# Patient Record
Sex: Female | Born: 1975
Health system: Southern US, Community
[De-identification: ages and names within clinical notes are randomized; demographics above are authoritative.]

## PROBLEM LIST (undated history)

## (undated) DIAGNOSIS — I10 Essential (primary) hypertension: Secondary | ICD-10-CM

## (undated) DIAGNOSIS — E119 Type 2 diabetes mellitus without complications: Secondary | ICD-10-CM

## (undated) DIAGNOSIS — F32A Depression, unspecified: Secondary | ICD-10-CM

## (undated) HISTORY — DX: Type 2 diabetes mellitus without complications: E11.9

## (undated) HISTORY — DX: Essential (primary) hypertension: I10

## (undated) HISTORY — DX: Depression, unspecified: F32.A

---
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
## 2001-07-02 ENCOUNTER — Other Ambulatory Visit: Admission: RE | Admit: 2001-07-02 | Discharge: 2001-07-02 | Payer: Self-pay | Admitting: Family Medicine

## 2003-05-13 ENCOUNTER — Other Ambulatory Visit: Admission: RE | Admit: 2003-05-13 | Discharge: 2003-05-13 | Payer: Self-pay | Admitting: Family Medicine

## 2003-06-05 ENCOUNTER — Other Ambulatory Visit: Admission: RE | Admit: 2003-06-05 | Discharge: 2003-06-05 | Payer: Self-pay | Admitting: Obstetrics and Gynecology

## 2003-12-01 ENCOUNTER — Other Ambulatory Visit: Admission: RE | Admit: 2003-12-01 | Discharge: 2003-12-01 | Payer: Self-pay | Admitting: Obstetrics and Gynecology

## 2004-08-09 ENCOUNTER — Other Ambulatory Visit: Admission: RE | Admit: 2004-08-09 | Discharge: 2004-08-09 | Payer: Self-pay | Admitting: Obstetrics and Gynecology

## 2005-08-22 ENCOUNTER — Other Ambulatory Visit: Admission: RE | Admit: 2005-08-22 | Discharge: 2005-08-22 | Payer: Self-pay | Admitting: Obstetrics and Gynecology

## 2006-10-02 ENCOUNTER — Other Ambulatory Visit: Admission: RE | Admit: 2006-10-02 | Discharge: 2006-10-02 | Payer: Self-pay | Admitting: Obstetrics and Gynecology

## 2007-06-19 ENCOUNTER — Ambulatory Visit: Payer: Self-pay | Admitting: Oncology

## 2007-09-09 ENCOUNTER — Other Ambulatory Visit: Admission: RE | Admit: 2007-09-09 | Discharge: 2007-09-09 | Payer: Self-pay | Admitting: Family Medicine

## 2009-01-28 ENCOUNTER — Other Ambulatory Visit: Admission: RE | Admit: 2009-01-28 | Discharge: 2009-01-28 | Payer: Self-pay | Admitting: Family Medicine

## 2010-05-17 ENCOUNTER — Ambulatory Visit: Payer: Self-pay | Admitting: Hematology & Oncology

## 2010-12-08 ENCOUNTER — Ambulatory Visit: Payer: Self-pay | Admitting: Internal Medicine

## 2010-12-09 ENCOUNTER — Ambulatory Visit: Payer: Managed Care, Other (non HMO) | Admitting: Internal Medicine

## 2010-12-26 ENCOUNTER — Other Ambulatory Visit (INDEPENDENT_AMBULATORY_CARE_PROVIDER_SITE_OTHER): Payer: Managed Care, Other (non HMO)

## 2010-12-26 ENCOUNTER — Encounter: Payer: Self-pay | Admitting: Internal Medicine

## 2010-12-26 ENCOUNTER — Ambulatory Visit (INDEPENDENT_AMBULATORY_CARE_PROVIDER_SITE_OTHER): Payer: Managed Care, Other (non HMO) | Admitting: Internal Medicine

## 2010-12-26 VITALS — BP 130/82 | HR 80 | Temp 98.1°F | Resp 16 | Ht 63.5 in | Wt 259.2 lb

## 2010-12-26 DIAGNOSIS — I10 Essential (primary) hypertension: Secondary | ICD-10-CM

## 2010-12-26 LAB — BASIC METABOLIC PANEL
BUN: 15 mg/dL (ref 6–23)
Calcium: 9.2 mg/dL (ref 8.4–10.5)
GFR: 90.28 mL/min (ref >60.00)
Glucose, Bld: 97 mg/dL (ref 70–99)
Potassium: 3.8 mEq/L (ref 3.5–5.1)

## 2010-12-26 MED ORDER — HYDROCHLOROTHIAZIDE 25 MG PO TABS: 25.0000 mg | ORAL_TABLET | Freq: Every day | ORAL | Status: DC

## 2010-12-26 MED ORDER — AMLODIPINE BESYLATE 10 MG PO TABS: 10.0000 mg | ORAL_TABLET | Freq: Every day | ORAL | Status: DC

## 2010-12-26 NOTE — Patient Instructions (Signed)

## 2010-12-26 NOTE — Progress Notes (Signed)
  Subjective:    Patient ID: Hannah Orr, female    DOB: Jul 28, 1975, 35 y.o.   MRN: [id number]  Hypertension This is a chronic problem. The current episode started more than 1 year ago. The problem has been gradually improving since onset. The problem is controlled. Pertinent negatives include no anxiety, blurred vision, chest pain, headaches, malaise/fatigue, neck pain, orthopnea, palpitations, peripheral edema, PND, shortness of breath or sweats. There are no associated agents to hypertension. Past treatments include calcium channel blockers and diuretics. The current treatment provides significant improvement. Compliance problems include exercise and diet.       Review of Systems  Constitutional: Negative.  Negative for malaise/fatigue.  HENT: Negative.  Negative for neck pain.   Eyes: Negative.  Negative for blurred vision.  Respiratory: Negative.  Negative for shortness of breath.   Cardiovascular: Negative.  Negative for chest pain, palpitations, orthopnea and PND.  Gastrointestinal: Negative.   Genitourinary: Negative.   Musculoskeletal: Negative.   Skin: Negative.   Neurological: Negative.  Negative for headaches.  Hematological: Negative.   Psychiatric/Behavioral: Negative.        Objective:   Physical Exam  Vitals reviewed. Constitutional: She is oriented to person, place, and time. She appears well-developed and well-nourished. No distress.  HENT:  Head: Normocephalic and atraumatic.  Mouth/Throat: Oropharynx is clear and moist. No oropharyngeal exudate.  Eyes: Conjunctivae are normal. Right eye exhibits no discharge. Left eye exhibits no discharge. No scleral icterus.  Neck: Normal range of motion. Neck supple. No JVD present. No tracheal deviation present. No thyromegaly present.  Cardiovascular: Normal rate, regular rhythm, normal heart sounds and intact distal pulses.  Exam reveals no gallop and no friction rub.   No murmur heard. Pulmonary/Chest: Effort normal  and breath sounds normal. No stridor. No respiratory distress. She has no wheezes. She has no rales. She exhibits no tenderness.  Abdominal: Soft. Bowel sounds are normal. She exhibits no distension and no mass. There is no tenderness. There is no rebound and no guarding.  Musculoskeletal: Normal range of motion. She exhibits no edema and no tenderness.  Lymphadenopathy:    She has no cervical adenopathy.  Neurological: She is oriented to person, place, and time. She displays normal reflexes. She exhibits normal muscle tone. Coordination normal.  Skin: Skin is warm and dry. No rash noted. She is not diaphoretic. No erythema. No pallor.  Psychiatric: She has a normal mood and affect. Her behavior is normal. Judgment and thought content normal.          Assessment & Plan:

## 2010-12-26 NOTE — Assessment & Plan Note (Signed)
Her BP is well controlled, I will check her lytes today, she is going to have her records sent to me from Dr. Yehuda Mao at [place]

## 2011-04-25 ENCOUNTER — Other Ambulatory Visit (INDEPENDENT_AMBULATORY_CARE_PROVIDER_SITE_OTHER): Payer: Managed Care, Other (non HMO)

## 2011-04-25 ENCOUNTER — Encounter: Payer: Self-pay | Admitting: Internal Medicine

## 2011-04-25 ENCOUNTER — Ambulatory Visit (INDEPENDENT_AMBULATORY_CARE_PROVIDER_SITE_OTHER): Payer: Managed Care, Other (non HMO) | Admitting: Internal Medicine

## 2011-04-25 DIAGNOSIS — I10 Essential (primary) hypertension: Secondary | ICD-10-CM

## 2011-04-25 DIAGNOSIS — F32A Depression, unspecified: Secondary | ICD-10-CM | POA: Insufficient documentation

## 2011-04-25 DIAGNOSIS — F329 Major depressive disorder, single episode, unspecified: Secondary | ICD-10-CM

## 2011-04-25 LAB — BASIC METABOLIC PANEL
CO2: 28 mEq/L (ref 19–32)
Calcium: 9.2 mg/dL (ref 8.4–10.5)
Creatinine, Ser: 0.7 mg/dL (ref 0.4–1.2)
GFR: 119.99 mL/min (ref >60.00)
Sodium: 138 mEq/L (ref 135–145)

## 2011-04-25 MED ORDER — DULOXETINE HCL 30 MG PO CPEP: 30.0000 mg | ORAL_CAPSULE | Freq: Every day | ORAL | Status: DC

## 2011-04-25 NOTE — Patient Instructions (Signed)
Hypertension As your heart beats, it forces blood through your arteries. This force is your blood pressure. If the pressure is too high, it is called hypertension (HTN) or high blood pressure. HTN is dangerous because you may have it and not know it. High blood pressure may mean that your heart has to work harder to pump blood. Your arteries may be narrow or stiff. The extra work puts you at risk for heart disease, stroke, and other problems.  Blood pressure consists of two numbers, a higher number over a lower, 110/72, for example. It is stated as "110 over 72." The ideal is below 120 for the top number (systolic) and under 80 for the bottom (diastolic). Write down your blood pressure today. You should pay close attention to your blood pressure if you have certain conditions such as:  Heart failure.   Prior heart attack.   Diabetes   Chronic kidney disease.   Prior stroke.   Multiple risk factors for heart disease.  To see if you have HTN, your blood pressure should be measured while you are seated with your arm held at the level of the heart. It should be measured at least twice. A one-time elevated blood pressure reading (especially in the Emergency Department) does not mean that you need treatment. There may be conditions in which the blood pressure is different between your right and left arms. It is important to see your caregiver soon for a recheck. Most people have essential hypertension which means that there is not a specific cause. This type of high blood pressure may be lowered by changing lifestyle factors such as:  Stress.   Smoking.   Lack of exercise.   Excessive weight.   Drug/tobacco/alcohol use.   Eating less salt.  Most people do not have symptoms from high blood pressure until it has caused damage to the body. Effective treatment can often prevent, delay or reduce that damage. TREATMENT  When a cause has been identified, treatment for high blood pressure is  directed at the cause. There are a large number of medications to treat HTN. These fall into several categories, and your caregiver will help you select the medicines that are best for you. Medications may have side effects. You should review side effects with your caregiver. If your blood pressure stays high after you have made lifestyle changes or started on medicines,   Your medication(s) may need to be changed.   Other problems may need to be addressed.   Be certain you understand your prescriptions, and know how and when to take your medicine.   Be sure to follow up with your caregiver within the time frame advised (usually within two weeks) to have your blood pressure rechecked and to review your medications.   If you are taking more than one medicine to lower your blood pressure, make sure you know how and at what times they should be taken. Taking two medicines at the same time can result in blood pressure that is too low.  SEEK IMMEDIATE MEDICAL CARE IF:  You develop a severe headache, blurred or changing vision, or confusion.   You have unusual weakness or numbness, or a faint feeling.   You have severe chest or abdominal pain, vomiting, or breathing problems.  MAKE SURE YOU:   Understand these instructions.   Will watch your condition.   Will get help right away if you are not doing well or get worse.  Document Released: 02/13/2005 Document Revised: 10/26/2010 Document Reviewed:   10/04/2007 ExitCare Patient Information 2012 [employer].Depression, Adolescent and Adult Depression is a true and treatable medical condition. In general there are two kinds of depression:  Depression we all experience in some form. For example depression from the death of a loved one, financial distress or natural disasters will trigger or increase depression.   Clinical depression, on the other hand, appears without an apparent cause or reason. This depression is a disease. Depression may be  caused by chemical imbalance in the body and brain or may come as a response to a physical illness. Alcohol and other drugs can cause depression.  DIAGNOSIS  The diagnosis of depression is usually based upon symptoms and medical history. TREATMENT  Treatments for depression fall into three categories. These are:  Drug therapy. There are many medicines that treat depression. Responses may vary and sometimes trial and error is necessary to determine the best medicines and dosage for a particular patient.   Psychotherapy, also called talking treatments, helps people resolve their problems by looking at them from a different point of view and by giving people insight into their own personal makeup. Traditional psychotherapy looks at a childhood source of a problem. Other psychotherapy will look at current conflicts and move toward solving those. If the cause of depression is drug use, counseling is available to help abstain. In time the depression will usually improve. If there were underlying causes for the chemical use, they can be addressed.   ECT (electroconvulsive therapy) or shock treatment is not as commonly used today. It is a very effective treatment for severe suicidal depression. During ECT electrical impulses are applied to the head. These impulses cause a generalized seizure. It can be effective but causes a loss of memory for recent events. Sometimes this loss of memory may include the last several months.  Treat all depression or suicide threats as serious. Obtain professional help. Do not wait to see if serious depression will get better over time without help. Seek help for yourself or those around you. In the [place] the number to the National Suicide Help Lines With 24 Hour Help Are: 1-800-SUICIDE 1-800-784-2433 Document Released: 02/11/2000 Document Revised: 10/26/2010 Document Reviewed: 10/02/2007 ExitCare Patient Information 2012 [employer]. 

## 2011-04-25 NOTE — Progress Notes (Signed)
  Subjective:    Patient ID: Hannah Orr, female    DOB: March 07, 1975, 36 y.o.   MRN: [id number]  Hypertension This is a chronic problem. The current episode started more than 1 year ago. The problem has been gradually improving since onset. The problem is controlled. Pertinent negatives include no anxiety, blurred vision, chest pain, headaches, malaise/fatigue, neck pain, orthopnea, palpitations, peripheral edema, PND, shortness of breath or sweats. Past treatments include diuretics and calcium channel blockers. The current treatment provides significant improvement. Compliance problems include exercise and diet.       Review of Systems  Constitutional: Negative for fever, chills, malaise/fatigue, activity change, appetite change, fatigue and unexpected weight change.  HENT: Negative.  Negative for neck pain.   Eyes: Negative.  Negative for blurred vision.  Respiratory: Negative for cough, chest tightness, shortness of breath and stridor.   Cardiovascular: Negative for chest pain, palpitations, orthopnea and PND.  Gastrointestinal: Negative for nausea, vomiting, abdominal pain, diarrhea, constipation, blood in stool, abdominal distention and anal bleeding.  Genitourinary: Negative.   Musculoskeletal: Negative for myalgias, back pain, joint swelling, arthralgias and gait problem.  Skin: Negative for color change, pallor, rash and wound.  Neurological: Negative for dizziness, tremors, seizures, syncope, facial asymmetry, speech difficulty, weakness, light-headedness, numbness and headaches.  Hematological: Negative for adenopathy. Does not bruise/bleed easily.  Psychiatric/Behavioral: Positive for sleep disturbance (dfa, fa, ema) and dysphoric mood (irritable, sad, angry, insomnia). Negative for suicidal ideas, hallucinations, behavioral problems, confusion, self-injury, decreased concentration and agitation. The patient is not nervous/anxious and is not hyperactive.        Objective:   Physical Exam  Vitals reviewed. Constitutional: She is oriented to person, place, and time. She appears well-developed and well-nourished. No distress.  HENT:  Head: Normocephalic and atraumatic.  Mouth/Throat: Oropharynx is clear and moist. No oropharyngeal exudate.  Eyes: Conjunctivae are normal. Right eye exhibits no discharge. Left eye exhibits no discharge. No scleral icterus.  Neck: Normal range of motion. Neck supple. No JVD present. No tracheal deviation present. No thyromegaly present.  Cardiovascular: Normal rate, regular rhythm, normal heart sounds and intact distal pulses.  Exam reveals no gallop and no friction rub.   No murmur heard. Pulmonary/Chest: Effort normal and breath sounds normal. No stridor. No respiratory distress. She has no wheezes. She has no rales. She exhibits no tenderness.  Abdominal: Soft. Bowel sounds are normal. She exhibits no distension and no mass. There is no tenderness. There is no rebound and no guarding.  Musculoskeletal: Normal range of motion. She exhibits no edema and no tenderness.  Lymphadenopathy:    She has no cervical adenopathy.  Neurological: She is oriented to person, place, and time.  Skin: Skin is warm and dry. No rash noted. She is not diaphoretic. No erythema. No pallor.  Psychiatric: She has a normal mood and affect. Her behavior is normal. Judgment and thought content normal.      Lab Results  Component Value Date   GLUCOSE 97 12/26/2010   NA 138 12/26/2010   K 3.8 12/26/2010   CL 102 12/26/2010   CREATININE 0.9 12/26/2010   BUN 15 12/26/2010   CO2 28 12/26/2010      Assessment & Plan:

## 2011-04-26 ENCOUNTER — Encounter: Payer: Self-pay | Admitting: Internal Medicine

## 2011-04-26 NOTE — Assessment & Plan Note (Signed)
Start cymbalta, start psychotherapy 

## 2011-04-26 NOTE — Assessment & Plan Note (Signed)
Her BP is well controlled, I will check her lytes and renal function 

## 2011-06-09 ENCOUNTER — Telehealth: Payer: Self-pay | Admitting: *Deleted

## 2011-06-09 DIAGNOSIS — F32A Depression, unspecified: Secondary | ICD-10-CM

## 2011-06-09 DIAGNOSIS — F329 Major depressive disorder, single episode, unspecified: Secondary | ICD-10-CM

## 2011-06-09 MED ORDER — FLUOXETINE HCL 20 MG PO CAPS: 20.0000 mg | ORAL_CAPSULE | Freq: Every day | ORAL | Status: DC

## 2011-06-09 NOTE — Telephone Encounter (Signed)
Left msg on triage md rx cymbalta & her insurance will not cover. Requesting md to rx something else in a generic form... 06/09/11@11 :57am/LMB

## 2011-06-09 NOTE — Telephone Encounter (Signed)
done

## 2011-06-09 NOTE — Telephone Encounter (Signed)
Called pt no answer LMOm md sent new rx to pharmacy... 06/09/11@1 :18pm/LMB

## 2011-08-01 ENCOUNTER — Telehealth: Payer: Self-pay | Admitting: Internal Medicine

## 2011-08-01 NOTE — Telephone Encounter (Signed)
Caller: Shaquella/Patient; PCP: Sanda Linger; CB#: [phone];  Call regarding Chest Discomfort and lower back pain- onset 08/01/11 and on Sunday she stayed in bed and hurt to change positions and pain now in upper R shoulder. She did go to work yesterday.  Pain is worse with deep breath, but no shortness of breath or significant cough. She took Tylenol and helped some. Sharp pain in top R portion of  back- near shoulder blade. Pain hurts worse after eating or drinking. Triage and Care advice per Chest Pain Protocol and appnt advised within 24 hours for "unexplained pain in shoulders, neck jaw arms, stomach or back lasting more than a few min. that has not bee evaluated..." Appnt scheduled for 1115- 08/02/11.

## 2011-08-02 ENCOUNTER — Ambulatory Visit (INDEPENDENT_AMBULATORY_CARE_PROVIDER_SITE_OTHER): Payer: Managed Care, Other (non HMO) | Admitting: Endocrinology

## 2011-08-02 ENCOUNTER — Encounter: Payer: Self-pay | Admitting: Endocrinology

## 2011-08-02 VITALS — BP 138/86 | HR 80 | Temp 98.0°F | Ht 65.0 in | Wt 268.0 lb

## 2011-08-02 DIAGNOSIS — S29011A Strain of muscle and tendon of front wall of thorax, initial encounter: Secondary | ICD-10-CM

## 2011-08-02 DIAGNOSIS — IMO0002 Reserved for concepts with insufficient information to code with codable children: Secondary | ICD-10-CM

## 2011-08-02 NOTE — Patient Instructions (Addendum)
Here are some samples of "duexis," to take twice a day.  I hope you feel better soon.  If you don't feel better by next week, please call back.  Please call sooner if you get worse.

## 2011-08-02 NOTE — Progress Notes (Signed)
  Subjective:    Patient ID: Hannah Orr, female    DOB: Mar 04, 1975, 36 y.o.   MRN: [id number]  HPI Pt states few days of intermittent moderate pain at the right posterior chest, worse in the context of cough, deep breathing, or twisting of the torso.  No assoc sob.  She is unable to cite precip factor.  LMP was 22 days ago. Past Medical History  Diagnosis Date  . Hypertension   . Diabetes mellitus     No past surgical history on file.  History   Social History  . Marital Status: Single    Spouse Name: N/A    Number of Children: N/A  . Years of Education: N/A   Occupational History  . Not on file.   Social History Main Topics  . Smoking status: Never Smoker   . Smokeless tobacco: Never Used  . Alcohol Use: No  . Drug Use: No  . Sexually Active: Not Currently    Birth Control/ Protection: Condom   Other Topics Concern  . Not on file   Social History Narrative  . No narrative on file    Current Outpatient Prescriptions on File Prior to Visit  Medication Sig Dispense Refill  . FLUoxetine (PROZAC) 20 MG capsule Take 1 capsule (20 mg total) by mouth daily.  30 capsule  11  . Multiple Vitamins-Calcium (ONE-A-DAY WOMENS PO) Take 1 each by mouth daily.        Marland Kitchen amLODipine (NORVASC) 10 MG tablet TAKE 1 TABLET BY MOUTH DAILY  30 tablet  1  . hydrochlorothiazide (HYDRODIURIL) 25 MG tablet TAKE 1 TABLET BY MOUTH DAILY  30 tablet  1    Allergies  Allergen Reactions  . Benazepril Hcl     cough    Family History  Problem Relation Age of Onset  . Hypertension Mother   . Hypertension Father   . Cancer Neg Hx   . Heart disease Neg Hx   . Hyperlipidemia Neg Hx   . Kidney disease Neg Hx    BP 138/86  Pulse 80  Temp(Src) 98 F (36.7 C) (Oral)  Ht 5\' 5"  (1.651 m)  Wt 268 lb (121.564 kg)  BMI 44.60 kg/m2  SpO2 99%  LMP 07/07/2011   Review of Systems Denies cough    Objective:   Physical Exam VITAL SIGNS:  See vs page GENERAL: no distress LUNGS:  Clear to  auscultation Chest-wall: nontender     Assessment & Plan:  Chest-wall pain, usually a muscle strain

## 2011-08-03 ENCOUNTER — Other Ambulatory Visit: Payer: Self-pay | Admitting: Internal Medicine

## 2011-08-22 ENCOUNTER — Encounter: Payer: Self-pay | Admitting: Internal Medicine

## 2011-08-22 ENCOUNTER — Ambulatory Visit (INDEPENDENT_AMBULATORY_CARE_PROVIDER_SITE_OTHER): Payer: Managed Care, Other (non HMO) | Admitting: Internal Medicine

## 2011-08-22 ENCOUNTER — Ambulatory Visit (INDEPENDENT_AMBULATORY_CARE_PROVIDER_SITE_OTHER)
Admission: RE | Admit: 2011-08-22 | Discharge: 2011-08-22 | Disposition: A | Discharge status: Home / Self Care | Payer: Managed Care, Other (non HMO) | Source: Ambulatory Visit | Attending: Internal Medicine | Admitting: Internal Medicine

## 2011-08-22 VITALS — BP 112/80 | HR 88 | Temp 98.7°F | Resp 16 | Ht 65.0 in | Wt 267.0 lb

## 2011-08-22 DIAGNOSIS — I10 Essential (primary) hypertension: Secondary | ICD-10-CM

## 2011-08-22 DIAGNOSIS — M542 Cervicalgia: Secondary | ICD-10-CM

## 2011-08-22 MED ORDER — METHOCARBAMOL 500 MG PO TABS: 500.0000 mg | ORAL_TABLET | Freq: Four times a day (QID) | ORAL | Status: AC

## 2011-08-22 MED ORDER — NAPROXEN SODIUM ER 375 MG PO TB24: 1.0000 | ORAL_TABLET | Freq: Two times a day (BID) | ORAL | Status: DC

## 2011-08-22 NOTE — Patient Instructions (Signed)
Torticollis, Acute You have suddenly (acutely) developed a twisted neck (torticollis). This is usually a self-limited condition. CAUSES  Acute torticollis may be caused by malposition, trauma or infection. Most commonly, acute torticollis is caused by sleeping in an awkward position. Torticollis may also be caused by the flexion, extension or twisting of the neck muscles beyond their normal position. Sometimes, the exact cause may not be known. SYMPTOMS  Usually, there is pain and limited movement of the neck. Your neck may twist to one side. DIAGNOSIS  The diagnosis is often made by physical examination. X-rays, CT scans or MRIs may be done if there is a history of trauma or concern of infection. TREATMENT  For a common, stiff neck that develops during sleep, treatment is focused on relaxing the contracted neck muscle. Medications (including shots) may be used to treat the problem. Most cases resolve in several days. Torticollis usually responds to conservative physical therapy. If left untreated, the shortened and spastic neck muscle can cause deformities in the face and neck. Rarely, surgery is required. HOME CARE INSTRUCTIONS   Use over-the-counter and prescription medications as directed by your caregiver.   Do stretching exercises and massage the neck as directed by your caregiver.   Follow up with physical therapy if needed and as directed by your caregiver.  SEEK IMMEDIATE MEDICAL CARE IF:   You develop difficulty breathing or noisy breathing (stridor).   You drool, develop trouble swallowing or have pain with swallowing.   You develop numbness or weakness in the hands or feet.   You have changes in speech or vision.   You have problems with urination or bowel movements.   You have difficulty walking.   You have a fever.   You have increased pain.  MAKE SURE YOU:   Understand these instructions.   Will watch your condition.   Will get help right away if you are not  doing well or get worse.  Document Released: 02/11/2000 Document Revised: 02/02/2011 Document Reviewed: 03/24/2009 Exit[employer] Patient Information 2012 [employer]. 

## 2011-08-22 NOTE — Assessment & Plan Note (Signed)
Her BP is well controlled 

## 2011-08-22 NOTE — Assessment & Plan Note (Signed)
Her Xray is normal and her exam is normal, I have changed her nsaids to naprelan and have asked her to try muscle relaxer as needed, she was given pt ed material and will try some exercises

## 2011-08-22 NOTE — Progress Notes (Signed)
Subjective:    Patient ID: Hannah Orr, female    DOB: [birth year], 36 y.o.   MRN: [id number]  Neck Pain  This is a recurrent problem. The current episode started 1 to 4 weeks ago. The problem occurs intermittently. The problem has been unchanged. The pain is associated with nothing. The pain is present in the right side. The quality of the pain is described as shooting and stabbing. The pain is at a severity of 3/10. The pain is mild. Nothing aggravates the symptoms. The pain is worse during the day. Stiffness is present all day. Associated symptoms include tingling (in her RUE). Pertinent negatives include no chest pain, fever, headaches, leg pain, numbness, pain with swallowing, paresis, photophobia, syncope, trouble swallowing, visual change, weakness or weight loss. She has tried NSAIDs for the symptoms. The treatment provided moderate relief.      Review of Systems  Constitutional: Negative for fever, chills, weight loss, diaphoresis, activity change, appetite change, fatigue and unexpected weight change.  HENT: Positive for neck pain and neck stiffness. Negative for facial swelling and trouble swallowing.   Eyes: Negative.  Negative for photophobia.  Respiratory: Negative for cough, chest tightness, shortness of breath, wheezing and stridor.   Cardiovascular: Negative for chest pain, palpitations, leg swelling and syncope.  Gastrointestinal: Negative for nausea, vomiting, abdominal pain, diarrhea, constipation, blood in stool and abdominal distention.  Genitourinary: Negative.   Musculoskeletal: Negative for myalgias, back pain, joint swelling, arthralgias and gait problem.  Skin: Negative for color change, pallor, rash and wound.  Neurological: Positive for tingling (in her RUE). Negative for dizziness, tremors, seizures, syncope, facial asymmetry, speech difficulty, weakness, light-headedness, numbness and headaches.  Hematological: Negative for adenopathy. Does not bruise/bleed  easily.  Psychiatric/Behavioral: Negative.        Objective:   Physical Exam  Vitals reviewed. Constitutional: She is oriented to person, place, and time. She appears well-developed and well-nourished. No distress.  HENT:  Head: Normocephalic and atraumatic.  Mouth/Throat: Oropharynx is clear and moist. No oropharyngeal exudate.  Eyes: Conjunctivae are normal. Right eye exhibits no discharge. Left eye exhibits no discharge. No scleral icterus.  Neck: Normal range of motion. Neck supple. No JVD present. No spinous process tenderness and no muscular tenderness present. No rigidity. No tracheal deviation, no edema, no erythema and normal range of motion present. No Brudzinski's sign and no Kernig's sign noted. No mass and no thyromegaly present.  Cardiovascular: Normal rate, regular rhythm, normal heart sounds and intact distal pulses.  Exam reveals no gallop and no friction rub.   No murmur heard. Pulmonary/Chest: Effort normal and breath sounds normal. No stridor. No respiratory distress. She has no wheezes. She has no rales. She exhibits no tenderness.  Abdominal: Soft. Bowel sounds are normal. She exhibits no distension and no mass. There is no tenderness. There is no rebound and no guarding.  Musculoskeletal: Normal range of motion. She exhibits no edema and no tenderness.       Cervical back: Normal. She exhibits normal range of motion, no tenderness, no bony tenderness, no swelling, no edema, no deformity, no laceration, no pain, no spasm and normal pulse.  Lymphadenopathy:    She has no cervical adenopathy.  Neurological: She is alert and oriented to person, place, and time. She has normal strength. She displays no atrophy, no tremor and normal reflexes. No cranial nerve deficit or sensory deficit. She exhibits normal muscle tone. She displays a negative Romberg sign. She displays no seizure activity. Coordination and gait normal.  She displays no Babinski's sign on the right side. She  displays no Babinski's sign on the left side.  Reflex Scores:      Tricep reflexes are 1+ on the right side and 1+ on the left side.      Bicep reflexes are 1+ on the right side and 1+ on the left side.      Brachioradialis reflexes are 1+ on the right side and 1+ on the left side.      Patellar reflexes are 1+ on the right side and 1+ on the left side.      Achilles reflexes are 1+ on the right side and 1+ on the left side. Skin: Skin is warm and dry. No rash noted. She is not diaphoretic. No erythema. No pallor.  Psychiatric: She has a normal mood and affect. Her behavior is normal. Judgment and thought content normal.      Lab Results  Component Value Date   GLUCOSE 90 04/25/2011   NA 138 04/25/2011   K 4.5 04/25/2011   CL 103 04/25/2011   CREATININE 0.7 04/25/2011   BUN 14 04/25/2011   CO2 28 04/25/2011      Assessment & Plan:

## 2011-10-03 ENCOUNTER — Ambulatory Visit: Payer: Managed Care, Other (non HMO) | Admitting: Internal Medicine

## 2011-10-13 ENCOUNTER — Other Ambulatory Visit: Payer: Self-pay | Admitting: Internal Medicine

## 2011-10-13 ENCOUNTER — Other Ambulatory Visit: Payer: Self-pay

## 2011-10-13 DIAGNOSIS — I10 Essential (primary) hypertension: Secondary | ICD-10-CM

## 2011-10-13 MED ORDER — HYDROCHLOROTHIAZIDE 25 MG PO TABS: 25.0000 mg | ORAL_TABLET | Freq: Every day | ORAL | Status: DC

## 2011-10-13 MED ORDER — AMLODIPINE BESYLATE 10 MG PO TABS: 10.0000 mg | ORAL_TABLET | Freq: Every day | ORAL | Status: DC

## 2011-10-13 NOTE — Telephone Encounter (Signed)
Caller: Rashaunda/Patient; PCP: Sanda Linger; CB#: [phone]; Call regarding Needs Refill On Both Her BP Medications( Amlodipine 10mg  and HCTZ 25mg ).  Her Pharmacy Is On File.;  Pharmacy - [facility] at  [facility] , [place] number [phone].   Caller has enough meds doses just for today.

## 2011-12-15 ENCOUNTER — Other Ambulatory Visit: Payer: Self-pay

## 2011-12-15 DIAGNOSIS — I10 Essential (primary) hypertension: Secondary | ICD-10-CM

## 2011-12-15 MED ORDER — AMLODIPINE BESYLATE 10 MG PO TABS: 10.0000 mg | ORAL_TABLET | Freq: Every day | ORAL | Status: DC

## 2011-12-15 MED ORDER — HYDROCHLOROTHIAZIDE 25 MG PO TABS: 25.0000 mg | ORAL_TABLET | Freq: Every day | ORAL | Status: DC

## 2012-01-01 ENCOUNTER — Other Ambulatory Visit: Payer: Self-pay

## 2012-01-01 DIAGNOSIS — I10 Essential (primary) hypertension: Secondary | ICD-10-CM

## 2012-01-01 MED ORDER — AMLODIPINE BESYLATE 10 MG PO TABS: 10.0000 mg | ORAL_TABLET | Freq: Every day | ORAL | Status: DC

## 2012-01-01 MED ORDER — HYDROCHLOROTHIAZIDE 25 MG PO TABS: 25.0000 mg | ORAL_TABLET | Freq: Every day | ORAL | Status: DC

## 2012-01-02 ENCOUNTER — Telehealth: Payer: Self-pay | Admitting: Internal Medicine

## 2012-01-02 NOTE — Telephone Encounter (Signed)
Caller: [name]/Patient; Patient Name: Hannah Orr; PCP: Sanda Linger (Adults only); Best Callback Phone Number: [phone].  Patient calling about refilling her BP medication.  Wants to know if needs appt before having that refilled.  Per Epic, patient seen in June 2013 for visit.  Dr. Yetta Barre wrote new Rx for norvasc and hctz 01/01/12 for 6 months.  Patient advised; will pick up Rx.  No further questions or concerns at this time.

## 2012-04-18 ENCOUNTER — Encounter: Payer: Self-pay | Admitting: Internal Medicine

## 2012-04-18 ENCOUNTER — Ambulatory Visit (INDEPENDENT_AMBULATORY_CARE_PROVIDER_SITE_OTHER): Payer: PRIVATE HEALTH INSURANCE | Admitting: Internal Medicine

## 2012-04-18 VITALS — BP 124/82 | HR 84 | Temp 97.5°F

## 2012-04-18 DIAGNOSIS — J069 Acute upper respiratory infection, unspecified: Secondary | ICD-10-CM

## 2012-04-18 MED ORDER — HYDROCODONE-HOMATROPINE 5-1.5 MG/5ML PO SYRP: 5.0000 mL | ORAL_SOLUTION | Freq: Three times a day (TID) | ORAL | Status: DC | PRN

## 2012-04-18 MED ORDER — AZITHROMYCIN 250 MG PO TABS: ORAL_TABLET | ORAL | Status: DC

## 2012-04-18 NOTE — Progress Notes (Signed)
HPI  Pt presents to the clinic today with c/o cold symptoms x 1 month. The worst part is the sore throat and dry cough. She does not produce any sputum. She was running fevers last week but none this week. She has tried Robitussin, Mucinex, cough drops and nothing seems to help. The cough is worse at night. She has not had much sleep in 3 nights. She denies a history of allergies and asthma. She does have sick contacts.  Review of Systems      Past Medical History  Diagnosis Date  . Hypertension   . Diabetes mellitus     Family History  Problem Relation Age of Onset  . Hypertension Mother   . Hypertension Father   . Cancer Neg Hx   . Heart disease Neg Hx   . Hyperlipidemia Neg Hx   . Kidney disease Neg Hx     History   Social History  . Marital Status: Single    Spouse Name: N/A    Number of Children: N/A  . Years of Education: N/A   Occupational History  . Not on file.   Social History Main Topics  . Smoking status: Never Smoker   . Smokeless tobacco: Never Used  . Alcohol Use: No  . Drug Use: No  . Sexually Active: Not Currently    Birth Control/ Protection: Condom   Other Topics Concern  . Not on file   Social History Narrative  . No narrative on file    Allergies  Allergen Reactions  . Benazepril Hcl     cough     Constitutional: Positive headache, fatigue and fever. Denies abrupt weight changes.  HEENT:  Positive sore throat. Denies eye redness, eye pain, pressure behind the eyes, facial pain, nasal congestion, ear pain, ringing in the ears, wax buildup, runny nose or bloody nose. Respiratory: Positive cough. Denies difficulty breathing or shortness of breath.  Cardiovascular: Denies chest pain, chest tightness, palpitations or swelling in the hands or feet.   No other specific complaints in a complete review of systems (except as listed in HPI above).  Objective:   BP 124/82  Pulse 84  Temp(Src) 97.5 F (36.4 C) (Oral)  SpO2 96% Wt Readings  from Last 3 Encounters:  08/22/11 267 lb (121.11 kg)  08/02/11 268 lb (121.564 kg)  04/25/11 264 lb (119.75 kg)     General: Appears her stated age, well developed, well nourished in NAD. HEENT: Head: normal shape and size; Eyes: sclera white, no icterus, conjunctiva pink, PERRLA and EOMs intact; Ears: Tm's gray and intact, normal light reflex; Nose: mucosa pink and moist, septum midline; Throat/Mouth: + PND. Teeth present, mucosa erythematous and moist, no exudate noted, no lesions or ulcerations noted.  Neck: Mild cervical lymphadenopathy. Neck supple, trachea midline. No massses, lumps or thyromegaly present.  Cardiovascular: Normal rate and rhythm. S1,S2 noted.  No murmur, rubs or gallops noted. No JVD or BLE edema. No carotid bruits noted. Pulmonary/Chest: Normal effort and positive vesicular breath sounds. No respiratory distress. No wheezes, rales or ronchi noted.      Assessment & Plan:   Upper Respiratory Infection, new onset with additional workup required:  Get some rest and drink plenty of water Do salt water gargles for the sore throat eRx for Azithromax x 5 days eRx for Hycodan cough syrup  RTC as needed or if symptoms persist.

## 2012-04-18 NOTE — Patient Instructions (Signed)

## 2012-04-22 ENCOUNTER — Ambulatory Visit (INDEPENDENT_AMBULATORY_CARE_PROVIDER_SITE_OTHER): Payer: PRIVATE HEALTH INSURANCE | Admitting: Internal Medicine

## 2012-04-22 ENCOUNTER — Ambulatory Visit (INDEPENDENT_AMBULATORY_CARE_PROVIDER_SITE_OTHER)
Admission: RE | Admit: 2012-04-22 | Discharge: 2012-04-22 | Disposition: A | Discharge status: Home / Self Care | Payer: PRIVATE HEALTH INSURANCE | Source: Ambulatory Visit | Attending: Internal Medicine | Admitting: Internal Medicine

## 2012-04-22 ENCOUNTER — Encounter: Payer: Self-pay | Admitting: Internal Medicine

## 2012-04-22 ENCOUNTER — Telehealth: Payer: Self-pay | Admitting: Internal Medicine

## 2012-04-22 VITALS — BP 126/84 | HR 107 | Temp 97.7°F

## 2012-04-22 DIAGNOSIS — J209 Acute bronchitis, unspecified: Secondary | ICD-10-CM

## 2012-04-22 DIAGNOSIS — R05 Cough: Secondary | ICD-10-CM

## 2012-04-22 DIAGNOSIS — R059 Cough, unspecified: Secondary | ICD-10-CM

## 2012-04-22 MED ORDER — HYDROCODONE-HOMATROPINE 5-1.5 MG/5ML PO SYRP: 5.0000 mL | ORAL_SOLUTION | Freq: Three times a day (TID) | ORAL | Status: DC | PRN

## 2012-04-22 MED ORDER — LEVOFLOXACIN 500 MG PO TABS: 500.0000 mg | ORAL_TABLET | Freq: Every day | ORAL | Status: DC

## 2012-04-22 MED ORDER — ALBUTEROL SULFATE HFA 108 (90 BASE) MCG/ACT IN AERS: 2.0000 | INHALATION_SPRAY | Freq: Four times a day (QID) | RESPIRATORY_TRACT | Status: DC | PRN

## 2012-04-22 NOTE — Patient Instructions (Signed)

## 2012-04-22 NOTE — Telephone Encounter (Signed)
Patient Information:  Caller Name: Jazzalynn  Phone: [phone]  Patient: Hannah Orr  Gender: Female  DOB: 10-Apr-1975  Age: 37 Years  PCP: Nicki Reaper  Pregnant: No  Office Follow Up:  Does the office need to follow up with this patient?: No  Instructions For The Office: N/A   Symptoms  Reason For Call & Symptoms: Calling about being seen in the office on 04/16/12 and prescribed Zpack for URI/Bronchitis and she is still coughing. Last week before starting the Zpack she was having some pain in chest. Sx better but still having coughing spells, especially at night. She ran out of Hycodan on 04/19/12 and didn't get much sleep over the weekend. Wheezing on and off.  She is wondering if she needs inhaler. Taking Delsym and Robtussin and not helping. Had to sleep upright last night.  Reviewed Health History In EMR: Yes  Reviewed Medications In EMR: Yes  Reviewed Allergies In EMR: Yes  Reviewed Surgeries / Procedures: Yes  Date of Onset of Symptoms: 03/25/2012  Treatments Tried: Zpack, Cough Syrup with Hydrocodone- Hycodan  Treatments Tried Worked: Yes OB / GYN:  LMP: 04/01/2012  Guideline(s) Used:  Cough  Disposition Per Guideline:   Go to Office Now  Reason For Disposition Reached:   Wheezing is present  Advice Given:  Reassurance  Coughing is the way that our lungs remove irritants and mucus. It helps protect our lungs from getting pneumonia.  You can get a dry hacking cough after a chest cold. Sometimes this type of cough can last 1-3 weeks, and be worse at night.  You can also get a cough after being exposed to irritating substances like smoke, strong perfumes, and dust.  Here is some care advice that should help.  Coughing Spasms:  Drink warm fluids. Inhale warm mist (Reason: both relax the airway and loosen up the phlegm).  Suck on cough drops or hard candy to coat the irritated throat.  Prevent Dehydration:  Drink adequate liquids.  This will help soothe an  irritated or dry throat and loosen up the phlegm.  Avoid Tobacco Smoke:  Smoking or being exposed to smoke makes coughs much worse.  Expected Course:   The expected course depends on what is causing the cough.  Viral bronchitis (chest cold) causes a cough that lasts 1 to 3 weeks. Sometimes you may cough up lots of phlegm (sputum, mucus). The mucus can normally be white, gray, yellow, or green.  Call Back If:  Difficulty breathing  Cough lasts more than 3 weeks  Fever lasts > 3 days  You become worse.  Appointment Scheduled:  04/22/2012 15:30:00 Appointment Scheduled Provider:  Nicki Reaper

## 2012-04-22 NOTE — Progress Notes (Signed)
HPI  Pt presents to the clinic today with c/o cold symptoms x 1 month. The worst part is the sore throat and dry cough. She does not produce any sputum. She was running fevers last week but none this week. She has tried Robitussin, Mucinex, cough drops and nothing seems to help. The cough is worse at night. She has not had much sleep in 3 nights. She was given azithromax but it has not helped. She feels like the cough is worse. She denies a history of allergies and asthma. She does have sick contacts.  Review of Systems      Past Medical History  Diagnosis Date  . Hypertension   . Diabetes mellitus     Family History  Problem Relation Age of Onset  . Hypertension Mother   . Hypertension Father   . Cancer Neg Hx   . Heart disease Neg Hx   . Hyperlipidemia Neg Hx   . Kidney disease Neg Hx     History   Social History  . Marital Status: Single    Spouse Name: N/A    Number of Children: N/A  . Years of Education: N/A   Occupational History  . Not on file.   Social History Main Topics  . Smoking status: Never Smoker   . Smokeless tobacco: Never Used  . Alcohol Use: No  . Drug Use: No  . Sexually Active: Not Currently    Birth Control/ Protection: Condom   Other Topics Concern  . Not on file   Social History Narrative  . No narrative on file    Allergies  Allergen Reactions  . Benazepril Hcl     cough     Constitutional: Positive headache, fatigue and fever. Denies abrupt weight changes.  HEENT:  Positive sore throat. Denies eye redness, eye pain, pressure behind the eyes, facial pain, nasal congestion, ear pain, ringing in the ears, wax buildup, runny nose or bloody nose. Respiratory: Positive cough. Denies difficulty breathing or shortness of breath.  Cardiovascular: Denies chest pain, chest tightness, palpitations or swelling in the hands or feet.   No other specific complaints in a complete review of systems (except as listed in HPI above).  Objective:    BP 126/84  Pulse 107  Temp(Src) 97.7 F (36.5 C) (Oral)  SpO2 95% Wt Readings from Last 3 Encounters:  08/22/11 267 lb (121.11 kg)  08/02/11 268 lb (121.564 kg)  04/25/11 264 lb (119.75 kg)     General: Appears her stated age, well developed, well nourished in NAD. HEENT: Head: normal shape and size; Eyes: sclera white, no icterus, conjunctiva pink, PERRLA and EOMs intact; Ears: Tm's gray and intact, normal light reflex; Nose: mucosa pink and moist, septum midline; Throat/Mouth: + PND. Teeth present, mucosa erythematous and moist, no exudate noted, no lesions or ulcerations noted.  Neck: Mild cervical lymphadenopathy. Neck supple, trachea midline. No massses, lumps or thyromegaly present.  Cardiovascular: Normal rate and rhythm. S1,S2 noted.  No murmur, rubs or gallops noted. No JVD or BLE edema. No carotid bruits noted. Pulmonary/Chest: Normal effort and positive vesicular breath sounds. No respiratory distress. No wheezes, rales or ronchi noted.      Assessment & Plan:   Acute Bronchitis, new onset with additional workup required:  Get some rest and drink plenty of water Do salt water gargles for the sore throat eRx for Levaquin x  7 days Refilled Hycodan cough syrup Chest xray to r/o pneumonia  RTC as needed or if symptoms persist.

## 2012-04-29 ENCOUNTER — Telehealth: Payer: Self-pay | Admitting: Internal Medicine

## 2012-04-29 NOTE — Telephone Encounter (Signed)
Patient Information:  Caller Name: Hannah Orr  Phone: [phone]  Patient: Hannah Orr, Hannah Orr  Gender: Female  DOB: 04/01/1975  Age: 37 Years  PCP: Sanda Linger (Adults only)  Pregnant: No  Office Follow Up:  Does the office need to follow up with this patient?: No  Instructions For The Office: N/A  RN Note:  Patient is going to try the Cough Care Advice before getting a refill on the Hydrocodone Cough Syrup. Advised to call back if the Care Measures do not help. Also advised to call the office back if she continues to need the Albuterol Inhaler even a couple times a week for Wheezing when she gets "warm". Patient Agreed.  Symptoms  Reason For Call & Symptoms: Patient was seen on 04/22/12 and diagnosed with Bronchitis. She was put on Levaquin and given an Albuterol Inhaler and Hydrocodone Cough Syrup. Patient states she is feeling much better, but that the cough is lingering mostly at night. She has used up the Hydrocodone Cough Syrup and is still using the inhaler because when she gets warm she states that she still has some slight wheezing. She wants to see what she can do for the cough and if she can get a refill for the cough syrup. She is not waking up every night and she is getting some "tickles during that day that have not responded to the Robitussin DM and Musinex she has been using. The congestion in her chest is better, as is the nasal congestion.  Reviewed Health History In EMR: Yes  Reviewed Medications In EMR: Yes  Reviewed Allergies In EMR: Yes  Reviewed Surgeries / Procedures: Yes  Date of Onset of Symptoms: 04/22/2012  Treatments Tried: Robitussin DM, Musinex  Treatments Tried Worked: No OB / GYN:  LMP: 04/08/2012  Guideline(s) Used:  Cough  Disposition Per Guideline:   Home Care  Reason For Disposition Reached:   Cough with no complications  Advice Given:  Reassurance  Coughing is the way that our lungs remove irritants and mucus. It helps protect our lungs  from getting pneumonia.  Cough Medicines:  OTC Cough Syrups: The most common cough suppressant in OTC cough medications is dextromethorphan. Often the letters "DM" appear in the name.  OTC Cough Drops: Cough drops can help a lot, especially for mild coughs. They reduce coughing by soothing your irritated throat and removing that tickle sensation in the back of the throat. Cough drops also have the advantage of portability - you can carry them with you.  Home Remedy - Hard Candy: Hard candy works just as well as medicine-flavored OTC cough drops. Diabetics should use sugar-free candy.  Home Remedy - Honey: This old home remedy has been shown to help decrease coughing at night. The adult dosage is 2 teaspoons (10 ml) at bedtime. Honey should not be given to infants under one year of age.  OTC Cough Syrup - Dextromethorphan:  Cough syrups containing the cough suppressant dextromethorphan (DM) may help decrease your cough. Cough syrups work best for coughs that keep you awake at night. They can also sometimes help in the late stages of a respiratory infection when the cough is dry and hacking. They can be used along with cough drops.  Examples: Benylin, Robitussin DM, Vicks 44 Cough Relief  Caution - Dextromethorphan:   Do not try to completely suppress coughs that produce mucus and phlegm. Remember that coughing is helpful in bringing up mucus from the lungs and preventing pneumonia.  Coughing Spasms:  Drink warm  fluids. Inhale warm mist (Reason: both relax the airway and loosen up the phlegm).  Coughing Spasms:  Drink warm fluids. Inhale warm mist (Reason: both relax the airway and loosen up the phlegm).  Suck on cough drops or hard candy to coat the irritated throat.  Prevent Dehydration:  Drink adequate liquids.  Avoid Tobacco Smoke:  Smoking or being exposed to smoke makes coughs much worse.  Expected Course:   Viral bronchitis (chest cold) causes a cough that lasts 1 to 3 weeks. Sometimes you  may cough up lots of phlegm (sputum, mucus). The mucus can normally be white, gray, yellow, or green.  Call Back If:  Difficulty breathing  Cough lasts more than 3 weeks  Fever lasts > 3 days  You become worse.

## 2012-05-11 ENCOUNTER — Other Ambulatory Visit: Payer: Self-pay | Admitting: Internal Medicine

## 2012-06-07 ENCOUNTER — Other Ambulatory Visit: Payer: Self-pay | Admitting: Internal Medicine

## 2012-08-13 ENCOUNTER — Other Ambulatory Visit: Payer: Self-pay | Admitting: Internal Medicine

## 2012-08-22 ENCOUNTER — Encounter: Payer: PRIVATE HEALTH INSURANCE | Admitting: Internal Medicine

## 2012-08-23 ENCOUNTER — Telehealth: Payer: Self-pay

## 2012-08-23 NOTE — Telephone Encounter (Signed)
Patient called [facility] requesting a high dose of Prozac due to current dose no longer working. Pt last seen Dr. Yetta Barre in 2013, will need follow up appt.

## 2012-08-23 NOTE — Telephone Encounter (Signed)
Called left vm for pt to call back to schedule an appt with Jones.

## 2012-10-16 ENCOUNTER — Telehealth: Payer: Self-pay | Admitting: *Deleted

## 2012-10-16 NOTE — Telephone Encounter (Signed)
Advised of MDs message, transferred to scheduling

## 2012-10-16 NOTE — Telephone Encounter (Signed)
Pt called states her menstrual is 8 days late, she is having light spotting.  Pt further states she took a home pregnancy test it was negative.  Please advise

## 2012-10-16 NOTE — Telephone Encounter (Signed)
She needs to be seen.

## 2012-10-17 ENCOUNTER — Encounter: Payer: Self-pay | Admitting: Internal Medicine

## 2012-10-17 ENCOUNTER — Other Ambulatory Visit (INDEPENDENT_AMBULATORY_CARE_PROVIDER_SITE_OTHER): Payer: PRIVATE HEALTH INSURANCE

## 2012-10-17 ENCOUNTER — Ambulatory Visit (INDEPENDENT_AMBULATORY_CARE_PROVIDER_SITE_OTHER): Payer: PRIVATE HEALTH INSURANCE | Admitting: Internal Medicine

## 2012-10-17 VITALS — BP 138/80 | HR 99 | Temp 98.4°F | Resp 20

## 2012-10-17 DIAGNOSIS — N920 Excessive and frequent menstruation with regular cycle: Secondary | ICD-10-CM

## 2012-10-17 DIAGNOSIS — R011 Cardiac murmur, unspecified: Secondary | ICD-10-CM

## 2012-10-17 DIAGNOSIS — Z0000 Encounter for general adult medical examination without abnormal findings: Secondary | ICD-10-CM

## 2012-10-17 DIAGNOSIS — I10 Essential (primary) hypertension: Secondary | ICD-10-CM

## 2012-10-17 DIAGNOSIS — N898 Other specified noninflammatory disorders of vagina: Secondary | ICD-10-CM

## 2012-10-17 LAB — CBC WITH DIFFERENTIAL/PLATELET
Basophils Absolute: 0 10*3/uL (ref 0.0–0.1)
Hemoglobin: 13.2 g/dL (ref 12.0–15.0)
Lymphocytes Relative: 22.8 % (ref 12.0–46.0)
Monocytes Relative: 9.8 % (ref 3.0–12.0)
Neutrophils Relative %: 63.3 % (ref 43.0–77.0)
Platelets: 391 10*3/uL (ref 150.0–400.0)
RDW: 13.4 % (ref 11.5–14.6)

## 2012-10-17 LAB — COMPREHENSIVE METABOLIC PANEL
ALT: 34 U/L (ref 0–35)
Albumin: 4.1 g/dL (ref 3.5–5.2)
CO2: 26 mEq/L (ref 19–32)
Calcium: 9.2 mg/dL (ref 8.4–10.5)
Chloride: 99 mEq/L (ref 96–112)
Creatinine, Ser: 0.8 mg/dL (ref 0.4–1.2)
GFR: 108.37 mL/min (ref >60.00)
Total Protein: 8.4 g/dL — ABNORMAL HIGH (ref 6.0–8.3)

## 2012-10-17 LAB — LIPID PANEL
Total CHOL/HDL Ratio: 3
Triglycerides: 115 mg/dL (ref 0.0–149.0)

## 2012-10-17 LAB — LUTEINIZING HORMONE: LH: 3.01 m[IU]/mL

## 2012-10-17 LAB — FOLLICLE STIMULATING HORMONE: FSH: 5.3 m[IU]/mL

## 2012-10-17 LAB — TSH: TSH: 2.97 u[IU]/mL (ref 0.35–5.50)

## 2012-10-17 LAB — HCG, QUANTITATIVE, PREGNANCY: hCG, Beta Chain, Quant, S: 0.32 m[IU]/mL

## 2012-10-17 NOTE — Patient Instructions (Signed)
Dysmenorrhea  Menstrual pain is caused by the muscles of the uterus tightening (contracting) during a menstrual period. The muscles of the uterus contract due to the chemicals in the uterine lining.  Primary dysmenorrhea is menstrual cramps that last a couple of days when you start having menstrual periods or soon after. This often begins after a teenager starts having her period. As a woman gets older or has a baby, the cramps will usually lesson or disappear.  Secondary dysmenorrhea begins later in life, lasts longer, and the pain may be stronger than primary dysmenorrhea. The pain may start before the period and last a few days after the period. This type of dysmenorrhea is usually caused by an underlying problem such as:   The tissue lining the uterus grows outside of the uterus in other areas of the body (endometriosis).   The endometrial tissue, which normally lines the uterus, is found in or grows into the muscular walls of the uterus (adenomyosis).   The pelvic blood vessels are engorged with blood just before the menstrual period (pelvic congestive syndrome).   Overgrowth of cells in the lining of the uterus or cervix (polyps of the uterus or cervix).   Falling down of the uterus (prolapse) because of loose or stretched ligaments.   Depression.   Bladder problems, infection, or inflammation.   Problems with the intestine, a tumor, or irritable bowel syndrome.   Cancer of the female organs or bladder.   A severely tipped uterus.   A very tight opening or closed cervix.   Noncancerous tumors of the uterus (fibroids).   Pelvic inflammatory disease (PID).   Pelvic scarring (adhesions) from a previous surgery.   Ovarian cyst.   An intrauterine device (IUD) used for birth control.  CAUSES   The cause of menstrual pain is often unknown.  SYMPTOMS    Cramping or throbbing pain in your lower abdomen.   Sometimes, a woman may also experience headaches.   Lower back pain.   Feeling sick to your  stomach (nausea) or vomiting.   Diarrhea.   Sweating or dizziness.  DIAGNOSIS   A diagnosis is based on your history, symptoms, physical examination, diagnostic tests, or procedures. Diagnostic tests or procedures may include:   Blood tests.   An ultrasound.   An examination of the lining of the uterus (dilation and curettage, D&C).   An examination inside your abdomen or pelvis with a scope (laparoscopy).   X-rays.   CT Scan.   MRI.   An examination inside the bladder with a scope (cystoscopy).   An examination inside the intestine or stomach with a scope (colonoscopy, gastroscopy).  TREATMENT   Treatment depends on the cause of the dysmenorrhea. Treatment may include:   Pain medicine prescribed by your caregiver.   Birth control pills.   Hormone replacement therapy.   Nonsteroidal anti-inflammatory drugs (NSAIDs). These may help stop the production of prostaglandins.   An IUD with progesterone hormone in it.   Acupuncture.   Surgery to remove adhesions, endometriosis, ovarian cyst, or fibroids.   Removal of the uterus (hysterectomy).   Progesterone shots to stop the menstrual period.   Cutting the nerves on the sacrum that go to the female organs (presacral neurectomy).   Electric currant to the sacral nerves (sacral nerve stimulation).   Antidepressant medicine.   Psychiatric therapy, counseling, or group therapy.   Exercise and physical therapy.   Meditation and yoga therapy.  HOME CARE INSTRUCTIONS    Only take over-the-counter   or prescription medicines for pain, discomfort, or fever as directed by your caregiver.   Place a heating pad or hot water bottle on your lower back or abdomen. Do not sleep with the heating pad.   Use aerobic exercises, walking, swimming, biking, and other exercises to help lessen the cramping.   Massage to the lower back or abdomen may help.   Stop smoking.   Avoid alcohol and caffeine.   Yoga, meditation, or acupuncture may help.  SEEK MEDICAL CARE IF:     The pain does not get better with medicine.   You have pain with sexual intercourse.  SEEK IMMEDIATE MEDICAL CARE IF:    Your pain increases and is not controlled with medicines.   You have a fever.   You develop nausea or vomiting with your period not controlled with medicine.   You have abnormal vaginal bleeding with your period.   You pass out.  MAKE SURE YOU:    Understand these instructions.   Will watch your condition.   Will get help right away if you are not doing well or get worse.  Document Released: 02/13/2005 Document Revised: 05/08/2011 Document Reviewed: 06/01/2008  [employer] Patient Information 2014 [employer]

## 2012-10-18 ENCOUNTER — Encounter: Payer: Self-pay | Admitting: Internal Medicine

## 2012-10-18 NOTE — Assessment & Plan Note (Signed)
I will check her labs to see if she has POV, thyroid disease, anemia, preganacy

## 2012-10-18 NOTE — Assessment & Plan Note (Signed)
Her BP is well controlled I will check her lytes and renal function 

## 2012-10-18 NOTE — Progress Notes (Signed)
  Subjective:    Patient ID: Hannah Orr, female    DOB: June 20, 1975, 37 y.o.   MRN: [id number]  HPI  She returns today and complains that her menstrual cycle started two weeks ago and she continues to have mild, painless spotting.  Review of Systems  Constitutional: Negative.  Negative for fever, chills, diaphoresis, activity change, appetite change, fatigue and unexpected weight change.  HENT: Negative.   Eyes: Negative.   Respiratory: Negative.  Negative for cough, chest tightness, shortness of breath, wheezing and stridor.   Cardiovascular: Negative.  Negative for chest pain, palpitations and leg swelling.  Gastrointestinal: Negative.  Negative for nausea, vomiting, abdominal pain, diarrhea and constipation.  Endocrine: Negative.   Genitourinary: Positive for menstrual problem. Negative for dysuria, urgency, frequency, hematuria, flank pain, decreased urine volume, vaginal bleeding, vaginal discharge, enuresis, difficulty urinating, genital sores, vaginal pain, pelvic pain and dyspareunia.  Musculoskeletal: Negative.  Negative for myalgias, back pain, joint swelling and gait problem.  Skin: Negative.   Allergic/Immunologic: Negative.   Neurological: Negative.   Hematological: Negative.  Negative for adenopathy. Does not bruise/bleed easily.  Psychiatric/Behavioral: Negative.        Objective:   Physical Exam  Vitals reviewed. Constitutional: She is oriented to person, place, and time. She appears well-developed and well-nourished. No distress.  HENT:  Head: Normocephalic and atraumatic.  Mouth/Throat: Oropharynx is clear and moist. No oropharyngeal exudate.  Eyes: Conjunctivae are normal. Right eye exhibits no discharge. Left eye exhibits no discharge. No scleral icterus.  Neck: Normal range of motion. Neck supple. No JVD present. No tracheal deviation present. No thyromegaly present.  Cardiovascular: Normal rate, regular rhythm, S1 normal, S2 normal and intact distal  pulses.  Exam reveals no gallop, no S3, no S4 and no friction rub.   Murmur heard.  Decrescendo systolic murmur is present with a grade of 1/6   No diastolic murmur is present  Pulses:      Carotid pulses are 1+ on the right side, and 1+ on the left side.      Radial pulses are 1+ on the right side, and 1+ on the left side.       Femoral pulses are 1+ on the right side, and 1+ on the left side.      Popliteal pulses are 1+ on the right side, and 1+ on the left side.       Dorsalis pedis pulses are 1+ on the right side, and 1+ on the left side.       Posterior tibial pulses are 1+ on the right side, and 1+ on the left side.  Pulmonary/Chest: Effort normal and breath sounds normal. No stridor. No respiratory distress. She has no wheezes. She has no rales. She exhibits no tenderness.  Abdominal: Soft. Bowel sounds are normal. She exhibits no distension and no mass. There is no tenderness. There is no rebound and no guarding.  Musculoskeletal: Normal range of motion. She exhibits no edema and no tenderness.  Lymphadenopathy:    She has no cervical adenopathy.  Neurological: She is oriented to person, place, and time.  Skin: Skin is warm and dry. No rash noted. She is not diaphoretic. No erythema. No pallor.  Psychiatric: She has a normal mood and affect. Her behavior is normal. Judgment and thought content normal.          Assessment & Plan:

## 2012-10-21 DIAGNOSIS — R011 Cardiac murmur, unspecified: Secondary | ICD-10-CM | POA: Insufficient documentation

## 2012-10-21 NOTE — Assessment & Plan Note (Signed)
No further testing is needed at this time

## 2012-10-21 NOTE — Assessment & Plan Note (Signed)
Exam done Labs ordered Vaccines were reviewed Pt ed material was given 

## 2013-01-14 ENCOUNTER — Other Ambulatory Visit: Payer: Self-pay | Admitting: Internal Medicine

## 2013-02-14 ENCOUNTER — Ambulatory Visit: Payer: PRIVATE HEALTH INSURANCE | Admitting: Internal Medicine

## 2013-02-18 ENCOUNTER — Ambulatory Visit: Payer: PRIVATE HEALTH INSURANCE | Admitting: Internal Medicine

## 2013-02-18 DIAGNOSIS — Z0289 Encounter for other administrative examinations: Secondary | ICD-10-CM

## 2013-03-14 ENCOUNTER — Other Ambulatory Visit: Payer: Self-pay | Admitting: Internal Medicine

## 2013-07-17 ENCOUNTER — Telehealth: Payer: Self-pay | Admitting: Internal Medicine

## 2013-07-17 ENCOUNTER — Other Ambulatory Visit: Payer: Self-pay | Admitting: Internal Medicine

## 2013-07-17 NOTE — Telephone Encounter (Signed)
Patient states that she just began a new job and will not be able to take off until on or after 08/08/2013 for office visit. She is asking if blood pressure medication can be refilled until then. Patient made OV for 08/08/2013. Please advise.

## 2013-07-18 MED ORDER — AMLODIPINE BESYLATE 10 MG PO TABS: ORAL_TABLET | ORAL | Status: DC

## 2013-07-18 MED ORDER — HYDROCHLOROTHIAZIDE 25 MG PO TABS: ORAL_TABLET | ORAL | Status: DC

## 2013-07-18 NOTE — Telephone Encounter (Signed)
Pt has called again.  She is out of the medicine.  Please call her when it has been sent.

## 2013-07-18 NOTE — Telephone Encounter (Signed)
Done

## 2013-08-08 ENCOUNTER — Ambulatory Visit (INDEPENDENT_AMBULATORY_CARE_PROVIDER_SITE_OTHER): Payer: PRIVATE HEALTH INSURANCE | Admitting: Internal Medicine

## 2013-08-08 ENCOUNTER — Other Ambulatory Visit (INDEPENDENT_AMBULATORY_CARE_PROVIDER_SITE_OTHER): Payer: PRIVATE HEALTH INSURANCE

## 2013-08-08 ENCOUNTER — Encounter: Payer: Self-pay | Admitting: Internal Medicine

## 2013-08-08 VITALS — BP 130/80 | HR 99 | Temp 98.4°F | Resp 16 | Ht 65.0 in | Wt 282.0 lb

## 2013-08-08 DIAGNOSIS — I10 Essential (primary) hypertension: Secondary | ICD-10-CM

## 2013-08-08 DIAGNOSIS — R7309 Other abnormal glucose: Secondary | ICD-10-CM

## 2013-08-08 LAB — BASIC METABOLIC PANEL
BUN: 11 mg/dL (ref 6–23)
CHLORIDE: 99 meq/L (ref 96–112)
CO2: 26 mEq/L (ref 19–32)
Calcium: 8.9 mg/dL (ref 8.4–10.5)
Creatinine, Ser: 0.7 mg/dL (ref 0.4–1.2)
GFR: 112.96 mL/min (ref >60.00)
Glucose, Bld: 93 mg/dL (ref 70–99)
POTASSIUM: 3.7 meq/L (ref 3.5–5.1)
SODIUM: 136 meq/L (ref 135–145)

## 2013-08-08 LAB — HEMOGLOBIN A1C: HEMOGLOBIN A1C: 5.3 % (ref 4.6–6.5)

## 2013-08-08 LAB — HCG, QUANTITATIVE, PREGNANCY: HCG, BETA CHAIN, QUANT, S: 0.03 m[IU]/mL

## 2013-08-08 MED ORDER — LORCASERIN HCL 10 MG PO TABS: 1.0000 | ORAL_TABLET | Freq: Two times a day (BID) | ORAL | Status: DC

## 2013-08-08 MED ORDER — LORCASERIN HCL 10 MG PO TABS
1.0000 | ORAL_TABLET | Freq: Two times a day (BID) | ORAL | Status: DC
Start: 2013-08-08 — End: 2013-12-12

## 2013-08-08 NOTE — Progress Notes (Signed)
   Subjective:    Patient ID: Hannah Orr, female    DOB: 1975-12-04, 38 y.o.   MRN: [id number]  Hypertension This is a chronic problem. The current episode started more than 1 year ago. The problem is controlled. Pertinent negatives include no anxiety, blurred vision, chest pain, headaches, malaise/fatigue, neck pain, orthopnea, palpitations, peripheral edema, PND, shortness of breath or sweats. There are no associated agents to hypertension. Past treatments include calcium channel blockers and diuretics. The current treatment provides significant improvement. There are no compliance problems.       Review of Systems  Constitutional: Negative.  Negative for fever, chills, malaise/fatigue, diaphoresis, appetite change and fatigue.  HENT: Negative.   Eyes: Negative.  Negative for blurred vision.  Respiratory: Negative.  Negative for cough, choking, chest tightness, shortness of breath and stridor.   Cardiovascular: Negative.  Negative for chest pain, palpitations, orthopnea, leg swelling and PND.  Gastrointestinal: Negative.  Negative for nausea, vomiting, abdominal pain, diarrhea, constipation and blood in stool.  Endocrine: Negative.   Genitourinary: Negative.   Musculoskeletal: Negative.  Negative for neck pain.  Skin: Negative.  Negative for rash.  Allergic/Immunologic: Negative.   Neurological: Negative.  Negative for dizziness and headaches.  Hematological: Negative.  Negative for adenopathy. Does not bruise/bleed easily.  Psychiatric/Behavioral: Negative.        Objective:   Physical Exam  Vitals reviewed. Constitutional: She is oriented to person, place, and time. She appears well-developed and well-nourished. No distress.  HENT:  Head: Normocephalic and atraumatic.  Mouth/Throat: Oropharynx is clear and moist. No oropharyngeal exudate.  Eyes: Conjunctivae are normal. Right eye exhibits no discharge. Left eye exhibits no discharge. No scleral icterus.  Neck: Normal  range of motion. Neck supple. No JVD present. No tracheal deviation present. No thyromegaly present.  Cardiovascular: Normal rate, regular rhythm, normal heart sounds and intact distal pulses.  Exam reveals no gallop and no friction rub.   No murmur heard. Pulmonary/Chest: Effort normal and breath sounds normal. No stridor. No respiratory distress. She has no wheezes. She has no rales. She exhibits no tenderness.  Abdominal: Soft. Bowel sounds are normal. She exhibits no distension and no mass. There is no tenderness. There is no rebound and no guarding.  Musculoskeletal: Normal range of motion. She exhibits no edema and no tenderness.  Lymphadenopathy:    She has no cervical adenopathy.  Neurological: She is oriented to person, place, and time.  Skin: Skin is warm and dry. No rash noted. She is not diaphoretic. No erythema. No pallor.  Psychiatric: She has a normal mood and affect. Her behavior is normal. Judgment and thought content normal.    Lab Results  Component Value Date   WBC 4.4* 10/17/2012   HGB 13.2 10/17/2012   HCT 39.0 10/17/2012   PLT 391.0 10/17/2012   GLUCOSE 116* 10/17/2012   CHOL 186 10/17/2012   TRIG 115.0 10/17/2012   HDL 72.00 10/17/2012   LDLCALC 91 10/17/2012   ALT 34 10/17/2012   AST 51* 10/17/2012   NA 134* 10/17/2012   K 3.5 10/17/2012   CL 99 10/17/2012   CREATININE 0.8 10/17/2012   BUN 12 10/17/2012   CO2 26 10/17/2012   TSH 2.97 10/17/2012        Assessment & Plan:

## 2013-08-08 NOTE — Progress Notes (Signed)
Pre visit review using our clinic review tool, if applicable. No additional management support is needed unless otherwise documented below in the visit note. 

## 2013-08-08 NOTE — Patient Instructions (Signed)

## 2013-08-11 ENCOUNTER — Encounter: Payer: Self-pay | Admitting: Internal Medicine

## 2013-08-11 NOTE — Assessment & Plan Note (Addendum)
Her BP is well controlled Her lytes and renal function are stable 

## 2013-08-11 NOTE — Assessment & Plan Note (Signed)
She has tried diet and exercise without much improvement She will try belviq to help with this

## 2013-08-14 ENCOUNTER — Telehealth: Payer: Self-pay | Admitting: Internal Medicine

## 2013-08-14 NOTE — Telephone Encounter (Signed)
PA paper fax to [employer](copy send to scan) [phone], pharmacy is aware that we are waiting for the result from insurance.

## 2013-08-14 NOTE — Telephone Encounter (Signed)
Her ins co will have to send me a form to complete

## 2013-08-14 NOTE — Telephone Encounter (Signed)
Pharmacy left message that they need PA for Belviq.  [employer]  [phone]

## 2013-08-18 ENCOUNTER — Other Ambulatory Visit: Payer: Self-pay | Admitting: Internal Medicine

## 2013-08-20 ENCOUNTER — Other Ambulatory Visit: Payer: Self-pay

## 2013-08-20 MED ORDER — HYDROCHLOROTHIAZIDE 25 MG PO TABS: ORAL_TABLET | ORAL | Status: DC

## 2013-08-27 ENCOUNTER — Telehealth: Payer: Self-pay | Admitting: Internal Medicine

## 2013-08-27 NOTE — Telephone Encounter (Signed)
Rx was approved and sent to pharmacy, however rejection from insurance company received. PA started and denied stating not covered on the plan, please advise on alternative.

## 2013-08-27 NOTE — Telephone Encounter (Signed)
There is no option that is similar to belviq

## 2013-08-27 NOTE — Telephone Encounter (Signed)
Patient is calling to request a that her prescription for Lorcaserin HCl (BELVIQ) 10 MG be sent to her [facility]. She says they did not receive rx that was sent last month for it. Please advise.

## 2013-11-20 ENCOUNTER — Telehealth: Payer: Self-pay | Admitting: Internal Medicine

## 2013-11-20 NOTE — Telephone Encounter (Signed)
Patient Information:  Caller Name: Kristyanna  Phone: [phone]  Patient: Hannah Orr  Gender: Female  DOB: 11-27-1975  Age: 38 Years  PCP: Webb Silversmith  Pregnant: No  Office Follow Up:  Does the office need to follow up with this patient?: No  Instructions For The Office: N/A  RN Note:  Care Advice per Guidelines. Advised to take something for her constipation.Has not had a BM since 11/17/13. Advised needs an appt. for evaluation. Pt. states cannot come until 11/24/13.  Due to scheduling parameters, pt. transferred to the office so they could schedule her.  Symptoms  Reason For Call & Symptoms: For the past week, in the area of the upper abdomen, having pain.  No indigestion. No vomiting. Has tried Pepto-Bismol. Did offer some relief. States also constipated. States the pain is mild now, but is worse at night when she is trying to sleep.  Reviewed Health History In EMR: Yes  Reviewed Medications In EMR: Yes  Reviewed Allergies In EMR: Yes  Reviewed Surgeries / Procedures: Yes  Date of Onset of Symptoms: 11/13/2013  Treatments Tried: Pepto-Bismol  Treatments Tried Worked: Yes OB / GYN:  LMP: 11/01/2013  Guideline(s) Used:  Abdominal Pain - Upper  Disposition Per Guideline:   See Today or Tomorrow in Office  Reason For Disposition Reached:   Mild pain that comes and goes (cramps) lasts > 24 hours  Advice Given:  Fluids:   Sip clear fluids only (e.g., water, flat soft drinks, or half-strength fruit juice) until the pain is gone for 2 hours. Then slowly return to a regular diet.  Diet:  Slowly advance diet from clear liquids to a bland diet.  Avoid alcohol or caffeinated beverages.  Avoid greasy or fatty foods.  Antacid:  If having pain now, try taking an antacid (e.g., Mylanta, Maalox). Dose: 2 tablespoons (30 ml) of liquid by mouth.  Reducing Reflux Symptoms (GERD):  Eat smaller meals and avoid snacks for 2 hours before sleeping. Avoid the following foods, which  tend to aggravate heartburn and stomach problems: fatty/greasy foods, spicy foods, caffeinated beverages, mints, and chocolate.  Call Back If:  Abdominal pain is constant and present for more than 2 hours.  You become worse.  Patient Will Follow Care Advice:  YES

## 2013-11-20 NOTE — Telephone Encounter (Signed)
Patient does not have an obgyn.  She wants to know if Dr. Ronnald Ramp would do a pap.  Please advise.

## 2013-11-20 NOTE — Telephone Encounter (Signed)
Can schedule or ref to gyn

## 2013-11-24 ENCOUNTER — Encounter: Payer: Self-pay | Admitting: Internal Medicine

## 2013-11-24 ENCOUNTER — Ambulatory Visit (INDEPENDENT_AMBULATORY_CARE_PROVIDER_SITE_OTHER): Payer: PRIVATE HEALTH INSURANCE | Admitting: Internal Medicine

## 2013-11-24 ENCOUNTER — Other Ambulatory Visit (INDEPENDENT_AMBULATORY_CARE_PROVIDER_SITE_OTHER): Payer: PRIVATE HEALTH INSURANCE

## 2013-11-24 VITALS — BP 118/82 | HR 81 | Temp 98.2°F | Resp 16 | Ht 65.0 in | Wt 275.0 lb

## 2013-11-24 DIAGNOSIS — R7402 Elevation of levels of lactic acid dehydrogenase (LDH): Secondary | ICD-10-CM

## 2013-11-24 DIAGNOSIS — K219 Gastro-esophageal reflux disease without esophagitis: Secondary | ICD-10-CM | POA: Insufficient documentation

## 2013-11-24 DIAGNOSIS — R740 Nonspecific elevation of levels of transaminase and lactic acid dehydrogenase [LDH]: Principal | ICD-10-CM

## 2013-11-24 DIAGNOSIS — K2100 Gastro-esophageal reflux disease with esophagitis, without bleeding: Secondary | ICD-10-CM

## 2013-11-24 DIAGNOSIS — R10816 Epigastric abdominal tenderness: Secondary | ICD-10-CM

## 2013-11-24 DIAGNOSIS — R7401 Elevation of levels of liver transaminase levels: Secondary | ICD-10-CM

## 2013-11-24 DIAGNOSIS — I10 Essential (primary) hypertension: Secondary | ICD-10-CM

## 2013-11-24 LAB — COMPREHENSIVE METABOLIC PANEL
ALK PHOS: 57 U/L (ref 39–117)
ALT: 42 U/L — AB (ref 0–35)
AST: 30 U/L (ref 0–37)
Albumin: 3.8 g/dL (ref 3.5–5.2)
BILIRUBIN TOTAL: 0.8 mg/dL (ref 0.2–1.2)
BUN: 9 mg/dL (ref 6–23)
CO2: 23 meq/L (ref 19–32)
CREATININE: 0.8 mg/dL (ref 0.4–1.2)
Calcium: 9 mg/dL (ref 8.4–10.5)
Chloride: 100 mEq/L (ref 96–112)
GFR: 111.05 mL/min (ref >60.00)
Glucose, Bld: 102 mg/dL — ABNORMAL HIGH (ref 70–99)
Potassium: 3.8 mEq/L (ref 3.5–5.1)
Sodium: 132 mEq/L — ABNORMAL LOW (ref 135–145)
Total Protein: 8 g/dL (ref 6.0–8.3)

## 2013-11-24 LAB — URINALYSIS, ROUTINE W REFLEX MICROSCOPIC
Bilirubin Urine: NEGATIVE
Hgb urine dipstick: NEGATIVE
Ketones, ur: NEGATIVE
Leukocytes, UA: NEGATIVE
Nitrite: NEGATIVE
PH: 8 (ref 5.0–8.0)
RBC / HPF: NONE SEEN (ref 0–?)
SPECIFIC GRAVITY, URINE: 1.01 (ref 1.000–1.030)
Total Protein, Urine: NEGATIVE
URINE GLUCOSE: NEGATIVE
Urobilinogen, UA: 1 (ref 0.0–1.0)

## 2013-11-24 LAB — CBC WITH DIFFERENTIAL/PLATELET
BASOS ABS: 0 10*3/uL (ref 0.0–0.1)
Basophils Relative: 0.6 % (ref 0.0–3.0)
EOS ABS: 0.2 10*3/uL (ref 0.0–0.7)
EOS PCT: 5.6 % — AB (ref 0.0–5.0)
HCT: 40.5 % (ref 36.0–46.0)
Hemoglobin: 13.6 g/dL (ref 12.0–15.0)
Lymphocytes Relative: 37.1 % (ref 12.0–46.0)
Lymphs Abs: 1.2 10*3/uL (ref 0.7–4.0)
MCHC: 33.5 g/dL (ref 30.0–36.0)
MCV: 101.5 fl — AB (ref 78.0–100.0)
MONO ABS: 0.4 10*3/uL (ref 0.1–1.0)
Monocytes Relative: 13.1 % — ABNORMAL HIGH (ref 3.0–12.0)
NEUTROS PCT: 43.6 % (ref 43.0–77.0)
Neutro Abs: 1.4 10*3/uL (ref 1.4–7.7)
Platelets: 589 10*3/uL — ABNORMAL HIGH (ref 150.0–400.0)
RBC: 3.99 Mil/uL (ref 3.87–5.11)
RDW: 14.3 % (ref 11.5–15.5)
WBC: 3.2 10*3/uL — AB (ref 4.0–10.5)

## 2013-11-24 LAB — LIPASE: Lipase: 27 U/L (ref 11.0–59.0)

## 2013-11-24 LAB — HCG, QUANTITATIVE, PREGNANCY: Quantitative HCG: 0.16 m[IU]/mL

## 2013-11-24 LAB — AMYLASE: AMYLASE: 75 U/L (ref 27–131)

## 2013-11-24 MED ORDER — DEXLANSOPRAZOLE 60 MG PO CPDR: 60.0000 mg | DELAYED_RELEASE_CAPSULE | Freq: Every day | ORAL | Status: DC

## 2013-11-24 MED ORDER — HYDROCODONE-ACETAMINOPHEN 5-325 MG PO TABS: 1.0000 | ORAL_TABLET | Freq: Four times a day (QID) | ORAL | Status: DC | PRN

## 2013-11-24 NOTE — Assessment & Plan Note (Signed)
I will screen her for viral hepatitis today I think this is fatty liver, will see what the U/S shows

## 2013-11-24 NOTE — Progress Notes (Signed)
Pre visit review using our clinic review tool, if applicable. No additional management support is needed unless otherwise documented below in the visit note. 

## 2013-11-24 NOTE — Patient Instructions (Signed)

## 2013-11-24 NOTE — Assessment & Plan Note (Signed)
I am concerned she may have gallstones, will get an U/S done Will check her labs today to screen for pregnancy, liver disease, pancreatitis, anemia, renal stones, etc. Will start a PPI and control pain with norco

## 2013-11-24 NOTE — Assessment & Plan Note (Signed)
Her BP is well controlled 

## 2013-11-24 NOTE — Progress Notes (Signed)
Subjective:    Patient ID: Hannah Orr, female    DOB: 1975/05/18, 38 y.o.   MRN: [id number]  Abdominal Pain This is a recurrent problem. The current episode started 1 to 4 weeks ago. The onset quality is gradual. The problem occurs intermittently. The problem has been unchanged. The pain is located in the epigastric region. The pain is at a severity of 3/10. The pain is mild. The quality of the pain is sharp. The abdominal pain does not radiate. Associated symptoms include constipation. Pertinent negatives include no anorexia, arthralgias, belching, diarrhea, dysuria, fever, flatus, frequency, headaches, hematochezia, hematuria, melena, myalgias, nausea, vomiting or weight loss. The pain is aggravated by certain positions, eating and movement. She has tried antacids for the symptoms. The treatment provided mild relief. Her past medical history is significant for GERD. There is no history of abdominal surgery, colon cancer, Crohn's disease, gallstones, irritable bowel syndrome, pancreatitis, PUD or ulcerative colitis.      Review of Systems  Constitutional: Negative.  Negative for fever, chills, weight loss, diaphoresis, activity change, appetite change, fatigue and unexpected weight change.  HENT: Negative.   Eyes: Negative.   Respiratory: Negative.  Negative for apnea, cough, choking, chest tightness, shortness of breath, wheezing and stridor.   Cardiovascular: Negative.  Negative for chest pain, palpitations and leg swelling.  Gastrointestinal: Positive for abdominal pain and constipation. Negative for nausea, vomiting, diarrhea, blood in stool, melena, hematochezia, abdominal distention, anal bleeding, rectal pain, anorexia and flatus.  Endocrine: Negative.   Genitourinary: Negative.  Negative for dysuria, urgency, frequency, hematuria, flank pain, decreased urine volume, vaginal bleeding, vaginal discharge, enuresis, difficulty urinating, genital sores, vaginal pain, menstrual problem,  pelvic pain and dyspareunia.  Musculoskeletal: Negative.  Negative for arthralgias and myalgias.  Skin: Negative.  Negative for rash.  Allergic/Immunologic: Negative.   Neurological: Negative.  Negative for dizziness, tremors, weakness, light-headedness and headaches.  Hematological: Negative.  Negative for adenopathy. Does not bruise/bleed easily.  Psychiatric/Behavioral: Negative.        Objective:   Physical Exam  Vitals reviewed. Constitutional: She is oriented to person, place, and time. She appears well-developed and well-nourished.  Non-toxic appearance. She does not have a sickly appearance. She does not appear ill. No distress.  HENT:  Head: Normocephalic and atraumatic.  Mouth/Throat: Oropharynx is clear and moist. No oropharyngeal exudate.  Eyes: Conjunctivae are normal. Right eye exhibits no discharge. Left eye exhibits no discharge. No scleral icterus.  Neck: Normal range of motion. Neck supple. No JVD present. No tracheal deviation present. No thyromegaly present.  Cardiovascular: Normal rate, regular rhythm, normal heart sounds and intact distal pulses.  Exam reveals no gallop and no friction rub.   No murmur heard. Pulmonary/Chest: Effort normal and breath sounds normal. No stridor. No respiratory distress. She has no wheezes. She has no rales. She exhibits no tenderness.  Abdominal: Soft. Normal appearance. She exhibits no shifting dullness, no distension, no pulsatile liver, no fluid wave, no abdominal bruit, no ascites, no pulsatile midline mass and no mass. Bowel sounds are decreased. There is no hepatosplenomegaly, splenomegaly or hepatomegaly. There is tenderness in the epigastric area. There is no rigidity, no rebound, no guarding, no CVA tenderness, no tenderness at McBurney's point and negative Murphy's sign. No hernia. Hernia confirmed negative in the ventral area, confirmed negative in the right inguinal area and confirmed negative in the left inguinal area.    Genitourinary: Rectum normal. Rectal exam shows no external hemorrhoid, no internal hemorrhoid, no fissure, no mass, no  tenderness and anal tone normal. Guaiac negative stool.  Musculoskeletal: Normal range of motion. She exhibits no edema and no tenderness.  Lymphadenopathy:    She has no cervical adenopathy.  Neurological: She is oriented to person, place, and time.  Skin: Skin is warm and dry. No rash noted. She is not diaphoretic. No erythema. No pallor.     Lab Results  Component Value Date   WBC 4.4* 10/17/2012   HGB 13.2 10/17/2012   HCT 39.0 10/17/2012   PLT 391.0 10/17/2012   GLUCOSE 93 08/08/2013   CHOL 186 10/17/2012   TRIG 115.0 10/17/2012   HDL 72.00 10/17/2012   LDLCALC 91 10/17/2012   ALT 34 10/17/2012   AST 51* 10/17/2012   NA 136 08/08/2013   K 3.7 08/08/2013   CL 99 08/08/2013   CREATININE 0.7 08/08/2013   BUN 11 08/08/2013   CO2 26 08/08/2013   TSH 2.97 10/17/2012   HGBA1C 5.3 08/08/2013       Assessment & Plan:

## 2013-11-24 NOTE — Assessment & Plan Note (Signed)
I am concerned she may have UGI pathology (gerd, ulcer, etc) I have asked her to stop nsaids Will start a PPI, check CBC today and is anemic - will refer to GI

## 2013-11-25 ENCOUNTER — Encounter: Payer: Self-pay | Admitting: Internal Medicine

## 2013-11-25 LAB — HEPATITIS A ANTIBODY, TOTAL: HEP A TOTAL AB: NONREACTIVE

## 2013-11-25 LAB — HEPATITIS C ANTIBODY: HCV AB: NEGATIVE

## 2013-11-25 LAB — HEPATITIS B SURFACE ANTIBODY,QUALITATIVE: Hep B S Ab: POSITIVE — AB

## 2013-11-25 LAB — HEPATITIS B CORE ANTIBODY, TOTAL: Hep B Core Total Ab: NONREACTIVE

## 2013-11-28 ENCOUNTER — Telehealth: Payer: Self-pay | Admitting: Internal Medicine

## 2013-11-28 NOTE — Telephone Encounter (Signed)
Patient Information:  Caller Name: Shauna Hugh  Phone: [phone]  Patient: Hannah Orr  Gender: Female  DOB: February 21, 1976  Age: 38 Years  PCP: Scarlette Calico (Adults only)  Pregnant: No  Office Follow Up:  Does the office need to follow up with this patient?: Yes Patient has not been given lab results.  Instructions For The Office: She states the Hydrocodone is not relieving her discomfort . Unable to sleep.  No appt available. Patient declined another location.  PLEASE CONTACT.  [phone]  RN Note:  She states the Hydrocodone is not relieving her discomfort . Unable to sleep.  No appt available. Patient declined another location.  PLEASE CONTACT.  [phone]  Symptoms  Reason For Call & Symptoms: Patient states she is having abdominal pain for two weeks. Pain is located upper epigastric pain /underneath breast in middle.  Described Constant "gnawing pain". Emesis x2. She was seen by Dr. Ronnald Ramp on 11/24/13. Rx for Hydrocodone and reflux medication  and U/S scheduled 12/05/13.  She has not been called about lab results.   Last [facility] Wednesday 11/26/13. Descreased appetite with pain worseing after eating.  Diet today- [employer] fish, water , pweraide  Reviewed Health History In EMR: Yes  Reviewed Medications In EMR: Yes  Reviewed Allergies In EMR: Yes  Reviewed Surgeries / Procedures: Yes  Date of Onset of Symptoms: 11/14/2013  Treatments Tried: Hydrocodone Dexilant  Treatments Tried Worked: No OB / GYN:  LMP: 11/01/2013  Guideline(s) Used:  Abdominal Pain - Upper  Disposition Per Guideline:   Go to ED Now (or to Office with PCP Approval)  Reason For Disposition Reached:   Constant abdominal pain lasting > 2 hours  Advice Given:  Fluids:   Sip clear fluids only (e.g., water, flat soft drinks, or half-strength fruit juice) until the pain is gone for 2 hours. Then slowly return to a regular diet.  Diet:  Slowly advance diet from clear liquids to a bland diet.  Avoid  alcohol or caffeinated beverages.  Avoid greasy or fatty foods.  Call Back If:  Abdominal pain is constant and present for more than 2 hours.  You become worse.  RN Overrode Recommendation:  Make Appointment  She states the Hydrocodone is not relieving her discomfort . Unable to sleep.  No appt available. Patient declined another location.  PLEASE CONTACT.  [phone]

## 2013-11-30 ENCOUNTER — Other Ambulatory Visit: Payer: Self-pay | Admitting: Internal Medicine

## 2013-12-01 NOTE — Telephone Encounter (Signed)
Notified patient.

## 2013-12-01 NOTE — Telephone Encounter (Signed)
She needs to be seen.

## 2013-12-01 NOTE — Telephone Encounter (Signed)
tylenol

## 2013-12-01 NOTE — Telephone Encounter (Signed)
Patient states she does not want to come back in b/c she states she is having an ultra sound Friday.  She states hydrocodone is working for her but she needs to know what she can take during the day that will help while she is at work instead of taking the hydrocodone.

## 2013-12-05 ENCOUNTER — Ambulatory Visit
Admission: RE | Admit: 2013-12-05 | Discharge: 2013-12-05 | Disposition: A | Discharge status: Home / Self Care | Payer: PRIVATE HEALTH INSURANCE | Source: Ambulatory Visit | Attending: Internal Medicine | Admitting: Internal Medicine

## 2013-12-05 DIAGNOSIS — R10816 Epigastric abdominal tenderness: Secondary | ICD-10-CM

## 2013-12-07 ENCOUNTER — Encounter: Payer: Self-pay | Admitting: Internal Medicine

## 2013-12-10 ENCOUNTER — Telehealth: Payer: Self-pay | Admitting: *Deleted

## 2013-12-10 ENCOUNTER — Other Ambulatory Visit: Payer: Self-pay | Admitting: Internal Medicine

## 2013-12-10 DIAGNOSIS — R10816 Epigastric abdominal tenderness: Secondary | ICD-10-CM

## 2013-12-10 NOTE — Telephone Encounter (Signed)
Notified pt with md response.../lmb 

## 2013-12-10 NOTE — Telephone Encounter (Signed)
Left msg on triage requesting results back on her U/S because she is not feeling any better. Called pt inform her md has mail out results letter, but did relay md response. Pt stated she is still having the pain on (R) side of her lower back. She is wanting to know why she is still hurting & what can she do to relieve the pain. Pls advise...Johny Chess

## 2013-12-10 NOTE — Telephone Encounter (Signed)
Cont norco for pain Since she is in pain of this degree I will order a CT of the abd

## 2013-12-11 ENCOUNTER — Telehealth: Payer: Self-pay | Admitting: Internal Medicine

## 2013-12-11 DIAGNOSIS — R10816 Epigastric abdominal tenderness: Secondary | ICD-10-CM

## 2013-12-11 MED ORDER — HYDROCODONE-ACETAMINOPHEN 5-325 MG PO TABS: 1.0000 | ORAL_TABLET | Freq: Four times a day (QID) | ORAL | Status: DC | PRN

## 2013-12-11 NOTE — Telephone Encounter (Signed)
Pt has been informed about having a CT performed and has a follow up appt scheduled for tomorrow to discuss her concerns.

## 2013-12-11 NOTE — Telephone Encounter (Signed)
There are no MRI results for her She had an U/S done that showed excess fatty tissue in her liver, this may be causing her pain but her pain is more severe than I would expect from fatty liver disease Will refill her pain med A CT is needed to see if there is something else that would cause this pain

## 2013-12-11 NOTE — Telephone Encounter (Signed)
Pt needs to be contacted in regards to pain medication refills. Pt also wants to be contacted about results of her MRI. Pt has questions about having a CT soon. Please contact pt when request is reviewed.

## 2013-12-12 ENCOUNTER — Encounter: Payer: Self-pay | Admitting: Internal Medicine

## 2013-12-12 ENCOUNTER — Ambulatory Visit (INDEPENDENT_AMBULATORY_CARE_PROVIDER_SITE_OTHER)
Admission: RE | Admit: 2013-12-12 | Discharge: 2013-12-12 | Disposition: A | Discharge status: Home / Self Care | Payer: PRIVATE HEALTH INSURANCE | Source: Ambulatory Visit | Attending: Internal Medicine | Admitting: Internal Medicine

## 2013-12-12 ENCOUNTER — Ambulatory Visit (INDEPENDENT_AMBULATORY_CARE_PROVIDER_SITE_OTHER): Payer: PRIVATE HEALTH INSURANCE | Admitting: Internal Medicine

## 2013-12-12 VITALS — BP 126/80 | HR 101 | Temp 98.2°F | Resp 16 | Ht 65.0 in | Wt 275.0 lb

## 2013-12-12 DIAGNOSIS — I10 Essential (primary) hypertension: Secondary | ICD-10-CM

## 2013-12-12 DIAGNOSIS — R109 Unspecified abdominal pain: Secondary | ICD-10-CM | POA: Insufficient documentation

## 2013-12-12 MED ORDER — DICLOFENAC 18 MG PO CAPS: 1.0000 | ORAL_CAPSULE | Freq: Three times a day (TID) | ORAL | Status: DC | PRN

## 2013-12-12 NOTE — Assessment & Plan Note (Signed)
Nutrition referral Try portion control She will not pay for belviq

## 2013-12-12 NOTE — Progress Notes (Signed)
Subjective:    Patient ID: Hannah Orr, female    DOB: 1975-09-20, 38 y.o.   MRN: [id number]  Flank Pain This is a recurrent problem. The current episode started 1 to 4 weeks ago. The problem occurs intermittently. The problem is unchanged. Pain location: right posterior flank. The quality of the pain is described as burning. The pain does not radiate. The pain is at a severity of 4/10. The pain is moderate. The pain is worse during the day. The symptoms are aggravated by position and twisting. Pertinent negatives include no abdominal pain, bladder incontinence, bowel incontinence, chest pain, dysuria, fever, headaches, leg pain, numbness, paresis, paresthesias, pelvic pain, perianal numbness, tingling, weakness or weight loss. Risk factors include obesity and lack of exercise. She has tried analgesics for the symptoms. The treatment provided mild relief.      Review of Systems  Constitutional: Positive for fatigue. Negative for fever, chills, weight loss, diaphoresis and appetite change.  HENT: Negative.   Eyes: Negative.   Respiratory: Negative.  Negative for cough, choking, chest tightness, shortness of breath and stridor.   Cardiovascular: Negative.  Negative for chest pain, palpitations and leg swelling.  Gastrointestinal: Negative.  Negative for nausea, vomiting, abdominal pain, diarrhea, constipation, blood in stool, abdominal distention, anal bleeding, rectal pain and bowel incontinence.  Endocrine: Negative.   Genitourinary: Positive for flank pain. Negative for bladder incontinence, dysuria, urgency, frequency, hematuria, decreased urine volume, vaginal bleeding, vaginal discharge, enuresis, difficulty urinating, genital sores, vaginal pain, menstrual problem, pelvic pain and dyspareunia.  Musculoskeletal: Negative.  Negative for arthralgias, back pain, myalgias and neck pain.  Skin: Negative.   Allergic/Immunologic: Negative.   Neurological: Negative.  Negative for tingling,  weakness, numbness, headaches and paresthesias.  Hematological: Negative.  Negative for adenopathy. Does not bruise/bleed easily.  Psychiatric/Behavioral: Negative.        Objective:   Physical Exam  Vitals reviewed. Constitutional: She is oriented to person, place, and time. She appears well-developed and well-nourished.  Non-toxic appearance. She does not have a sickly appearance. She does not appear ill. No distress.  HENT:  Head: Normocephalic and atraumatic.  Mouth/Throat: Oropharynx is clear and moist. No oropharyngeal exudate.  Eyes: Conjunctivae are normal. Right eye exhibits no discharge. Left eye exhibits no discharge. No scleral icterus.  Neck: Normal range of motion. Neck supple. No JVD present. No tracheal deviation present. No thyromegaly present.  Cardiovascular: Normal rate, regular rhythm, normal heart sounds and intact distal pulses.  Exam reveals no gallop and no friction rub.   No murmur heard. Pulmonary/Chest: Effort normal and breath sounds normal. No stridor. No respiratory distress. She has no wheezes. She has no rales. She exhibits no tenderness.  Abdominal: Soft. Normal appearance and bowel sounds are normal. She exhibits no shifting dullness, no distension, no pulsatile liver, no fluid wave, no abdominal bruit, no ascites, no pulsatile midline mass and no mass. There is no hepatosplenomegaly, splenomegaly or hepatomegaly. There is no tenderness. There is no rebound, no guarding and no CVA tenderness. No hernia. Hernia confirmed negative in the ventral area, confirmed negative in the right inguinal area and confirmed negative in the left inguinal area.  Musculoskeletal: Normal range of motion. She exhibits no edema and no tenderness.       Arms: Lymphadenopathy:    She has no cervical adenopathy.  Neurological: She is oriented to person, place, and time.  Skin: Skin is warm and dry. No rash noted. She is not diaphoretic. No erythema. No pallor.  Psychiatric:  She has  a normal mood and affect. Her behavior is normal. Judgment and thought content normal.     Lab Results  Component Value Date   WBC 3.2* 11/24/2013   HGB 13.6 11/24/2013   HCT 40.5 11/24/2013   PLT 589.0* 11/24/2013   GLUCOSE 102* 11/24/2013   CHOL 186 10/17/2012   TRIG 115.0 10/17/2012   HDL 72.00 10/17/2012   LDLCALC 91 10/17/2012   ALT 42* 11/24/2013   AST 30 11/24/2013   NA 132* 11/24/2013   K 3.8 11/24/2013   CL 100 11/24/2013   CREATININE 0.8 11/24/2013   BUN 9 11/24/2013   CO2 23 11/24/2013   TSH 2.97 10/17/2012   HGBA1C 5.3 08/08/2013       Assessment & Plan:

## 2013-12-12 NOTE — Progress Notes (Signed)
Pre visit review using our clinic review tool, if applicable. No additional management support is needed unless otherwise documented below in the visit note. 

## 2013-12-12 NOTE — Patient Instructions (Addendum)
Flank Pain Flank pain refers to pain that is located on the side of the body between the upper abdomen and the back. The pain may occur over a short period of time (acute) or may be long-term or reoccurring (chronic). It may be mild or severe. Flank pain can be caused by many things. CAUSES  Some of the more common causes of flank pain include:  Muscle strains.   Muscle spasms.   A disease of your spine (vertebral disk disease).   A lung infection (pneumonia).   Fluid around your lungs (pulmonary edema).   A kidney infection.   Kidney stones.   A very painful skin rash caused by the chickenpox virus (shingles).   Gallbladder disease.  [place] care will depend on the cause of your pain. In general,  Rest as directed by your caregiver.  Drink enough fluids to keep your urine clear or pale yellow.  Only take over-the-counter or prescription medicines as directed by your caregiver. Some medicines may help relieve the pain.  Tell your caregiver about any changes in your pain.  Follow up with your caregiver as directed. SEEK IMMEDIATE MEDICAL CARE IF:   Your pain is not controlled with medicine.   You have new or worsening symptoms.  Your pain increases.   You have abdominal pain.   You have shortness of breath.   You have persistent nausea or vomiting.   You have swelling in your abdomen.   You feel faint or pass out.   You have blood in your urine.  You have a fever or persistent symptoms for more than 2-3 days.  You have a fever and your symptoms suddenly get worse. MAKE SURE YOU:   Understand these instructions.  Will watch your condition.  Will get help right away if you are not doing well or get worse. Document Released: 04/06/2005 Document Revised: 11/08/2011 Document Reviewed: 09/28/2011 [facility] Patient Information 2015 [place], [place]. This information is not intended to replace advice given to you by your  health care provider. Make sure you discuss any questions you have with your health care provider. Serving Sizes What we call a serving size today is larger than it was in the past. A 1950s fast-food burger contained little more than 1 oz of meat, and a soft drink was 8 oz (1 cup). Today, a "quarter pounder" burger is at least 4 times that amount, and a 32 or 64 oz drink is not uncommon. A possible guide for eating when trying to lose weight is to eat about half as much as you normally do. Some estimates of serving sizes are:  1 Dairy serving:Individual container of yogurt (8 oz) or piece of cheese the size of your thumb (1 oz).  1 Grain serving: 1 slice of bread or  cup pasta.  1 Meat serving: The size of a deck of cards (3 oz).  1 Fruit serving: cup canned fruit or 1 medium fruit.  1 Vegetable serving:  cup of cooked or canned vegetables.  1 Fat serving:The size of 4 stacked dimes. Experts suggest spending 1 or 2 days measuring food portions you commonly eat. This will give you better practice at estimating serving sizes, and will also show whether you are eating an appropriate amount of food to meet your weight goals. If you find that you are eating more than you thought, try measuring your food for a few days so you can "reprogram" yourself to learn what makes a healthy portion  for you. SUGGESTIONS FOR CONTROL  In restaurants, share entrees, or ask the waiter to put half the entre in a box or bag before you even touch it.  Order lunch-sized portions. Many restaurants serve 4 to 6 oz of meat at lunch, compared with 8 to 10 oz at dinner.  Split dessert or skip it all together. Have a piece of fruit when you get home.  At home, use smaller plates and bowls. It will look as if you are eating more.  Plate your food in the kitchen rather than serving it "family style" at the table.  Wait 20 to 30 minutes before taking seconds. This is how long it takes your brain to recognize that you  are full.  Check food labels for serving sizes. Eat 1 serving only.  Use measuring cups and spoons to see proper serving sizes.  Buy smaller packages of candy, popcorn, and snacks.  Avoid eating directly out of the bag or carton.  While eating half as much, exercise twice as much. Park further away from the mall, take the stairs instead of the escalator, and walk around your block. Losing weight is a slow, difficult process. It takes long-lasting lifestyle changes. You can make gradual changes over time so they become habits. Look to friends and family to support the healthy changes you are making. Avoid fad diets since they are often only temporary weight loss solutions. Document Released: 11/12/2002 Document Revised: 05/08/2011 Document Reviewed: 05/13/2013 [facility] Patient Information 2015 [place], [place]. This information is not intended to replace advice given to you by your health care provider. Make sure you discuss any questions you have with your health care provider.

## 2013-12-14 ENCOUNTER — Encounter: Payer: Self-pay | Admitting: Internal Medicine

## 2013-12-15 ENCOUNTER — Encounter: Payer: Self-pay | Admitting: Internal Medicine

## 2013-12-16 NOTE — Assessment & Plan Note (Signed)
Plain films are normal Recent labs were normal Exam is c/w MS pain, will start nsaids I have ordered a CT scan to look for mass, abscess, organ pathology

## 2013-12-16 NOTE — Assessment & Plan Note (Signed)
Her BP is well controlled 

## 2013-12-17 ENCOUNTER — Telehealth: Payer: Self-pay | Admitting: Internal Medicine

## 2013-12-22 ENCOUNTER — Other Ambulatory Visit: Payer: Self-pay

## 2013-12-22 MED ORDER — NAPROXEN 375 MG PO TABS: 375.0000 mg | ORAL_TABLET | Freq: Two times a day (BID) | ORAL | Status: DC

## 2013-12-23 ENCOUNTER — Ambulatory Visit (INDEPENDENT_AMBULATORY_CARE_PROVIDER_SITE_OTHER)
Admission: RE | Admit: 2013-12-23 | Discharge: 2013-12-23 | Disposition: A | Discharge status: Home / Self Care | Payer: PRIVATE HEALTH INSURANCE | Source: Ambulatory Visit | Attending: Internal Medicine | Admitting: Internal Medicine

## 2013-12-23 DIAGNOSIS — R10816 Epigastric abdominal tenderness: Secondary | ICD-10-CM

## 2013-12-23 MED ORDER — IOHEXOL 300 MG/ML  SOLN
100.0000 mL | Freq: Once | INTRAMUSCULAR | Status: AC | PRN
  Administered 2013-12-23: 100 mL via INTRAVENOUS

## 2013-12-26 ENCOUNTER — Telehealth: Payer: Self-pay | Admitting: *Deleted

## 2013-12-26 NOTE — Telephone Encounter (Signed)
Pt request to know the CT result today due to the a mouth of pain that she is in and they have been calling about this couple times. Please help, Dr. Ronnald Ramp it out of the office. Please call Tiney Rouge [phone] (sister)

## 2013-12-26 NOTE — Telephone Encounter (Signed)
CT: no specific findings to explain pain on the R side. Please f/u w/Dr Ronnald Ramp on 11/6. Plan: as you discussed w/Dr Langley Adie   CT IMPRESSION:  1. Mild left adnexal ovarian fullness is noted with associated  punctate calcific density. Although the calcifications may be  related to phleboliths, pelvic ultrasound suggested to exclude a  calcified left ovarian lesion.  2. Multiple small inguinal, iliac, and retroperitoneal lymph nodes.  3. Mild infiltrates both lung bases .

## 2013-12-26 NOTE — Telephone Encounter (Signed)
Left msg on triage requesting her CT results. Called pt LMOM md out this afternoon will be Monday before msg will be adress.Marland KitchenJohny Chess

## 2013-12-28 ENCOUNTER — Encounter: Payer: Self-pay | Admitting: Internal Medicine

## 2013-12-28 ENCOUNTER — Other Ambulatory Visit: Payer: Self-pay | Admitting: Internal Medicine

## 2013-12-28 DIAGNOSIS — R1900 Intra-abdominal and pelvic swelling, mass and lump, unspecified site: Secondary | ICD-10-CM

## 2013-12-29 ENCOUNTER — Encounter: Payer: Self-pay | Admitting: Internal Medicine

## 2013-12-29 NOTE — Telephone Encounter (Signed)
Pt called in requesting a return call about her ct scan/  Reminded her she has an appt on the 5th.  She said that she had questions before she came, best number is [phone]

## 2013-12-29 NOTE — Telephone Encounter (Signed)
Called pt no answer LMOM with md response.../lmb 

## 2014-01-01 ENCOUNTER — Telehealth: Payer: Self-pay | Admitting: Internal Medicine

## 2014-01-01 ENCOUNTER — Encounter: Payer: Self-pay | Admitting: Internal Medicine

## 2014-01-01 ENCOUNTER — Ambulatory Visit (INDEPENDENT_AMBULATORY_CARE_PROVIDER_SITE_OTHER): Payer: PRIVATE HEALTH INSURANCE | Admitting: Internal Medicine

## 2014-01-01 VITALS — BP 136/80 | HR 91 | Temp 98.2°F | Resp 16 | Ht 65.0 in

## 2014-01-01 DIAGNOSIS — R1900 Intra-abdominal and pelvic swelling, mass and lump, unspecified site: Secondary | ICD-10-CM

## 2014-01-01 DIAGNOSIS — I10 Essential (primary) hypertension: Secondary | ICD-10-CM

## 2014-01-01 NOTE — Progress Notes (Signed)
Pre visit review using our clinic review tool, if applicable. No additional management support is needed unless otherwise documented below in the visit note. 

## 2014-01-01 NOTE — Assessment & Plan Note (Signed)
Her BP is well controlled 

## 2014-01-01 NOTE — Patient Instructions (Signed)

## 2014-01-01 NOTE — Assessment & Plan Note (Signed)
There is no pain and her exam is normal Pelvic u/s ordered Will follow for now

## 2014-01-01 NOTE — Progress Notes (Signed)
   Subjective:    Patient ID: Hannah Orr, female    DOB: [birth year], 38 y.o.   MRN: [id number]  HPI She returns and tells me that the abd pain has resolved, her CT scan showed a mass in the area of the left ovary. This needs further evaluation with an u/s. She feels well today and offers no complaints.   Review of Systems  Constitutional: Negative.  Negative for fever, chills, diaphoresis, appetite change and fatigue.  HENT: Negative.   Eyes: Negative.   Respiratory: Negative.  Negative for cough, choking, chest tightness, shortness of breath and stridor.   Cardiovascular: Negative.  Negative for chest pain, palpitations and leg swelling.  Gastrointestinal: Negative.  Negative for vomiting, abdominal pain, diarrhea, constipation and blood in stool.  Endocrine: Negative.   Genitourinary: Negative.  Negative for dysuria, urgency, hematuria, flank pain, vaginal bleeding, vaginal discharge, difficulty urinating, vaginal pain, menstrual problem and pelvic pain.  Musculoskeletal: Negative.   Skin: Negative.   Allergic/Immunologic: Negative.   Neurological: Negative.   Hematological: Negative.  Negative for adenopathy. Does not bruise/bleed easily.  Psychiatric/Behavioral: Negative.        Objective:   Physical Exam  Constitutional: She is oriented to person, place, and time. She appears well-developed and well-nourished. No distress.  HENT:  Head: Normocephalic and atraumatic.  Mouth/Throat: Oropharynx is clear and moist. No oropharyngeal exudate.  Eyes: Conjunctivae are normal. Right eye exhibits no discharge. Left eye exhibits no discharge. No scleral icterus.  Neck: Normal range of motion. Neck supple. No JVD present. No tracheal deviation present. No thyromegaly present.  Cardiovascular: Normal rate, regular rhythm, normal heart sounds and intact distal pulses.  Exam reveals no gallop and no friction rub.   No murmur heard. Pulmonary/Chest: Effort normal and breath sounds  normal. No stridor. No respiratory distress. She has no wheezes. She has no rales. She exhibits no tenderness.  Abdominal: Soft. Normal appearance and bowel sounds are normal. She exhibits no distension and no mass. There is no hepatosplenomegaly, splenomegaly or hepatomegaly. There is no tenderness. There is no rebound, no guarding and no CVA tenderness. No hernia. Hernia confirmed negative in the ventral area.  Musculoskeletal: Normal range of motion. She exhibits no edema or tenderness.  Lymphadenopathy:    She has no cervical adenopathy.  Neurological: She is oriented to person, place, and time.  Skin: Skin is warm and dry. No rash noted. She is not diaphoretic. No erythema. No pallor.  Vitals reviewed.    Lab Results  Component Value Date   WBC 3.2* 11/24/2013   HGB 13.6 11/24/2013   HCT 40.5 11/24/2013   PLT 589.0* 11/24/2013   GLUCOSE 102* 11/24/2013   CHOL 186 10/17/2012   TRIG 115.0 10/17/2012   HDL 72.00 10/17/2012   LDLCALC 91 10/17/2012   ALT 42* 11/24/2013   AST 30 11/24/2013   NA 132* 11/24/2013   K 3.8 11/24/2013   CL 100 11/24/2013   CREATININE 0.8 11/24/2013   BUN 9 11/24/2013   CO2 23 11/24/2013   TSH 2.97 10/17/2012   HGBA1C 5.3 08/08/2013       Assessment & Plan:

## 2014-01-01 NOTE — Telephone Encounter (Signed)
done

## 2014-01-01 NOTE — Telephone Encounter (Signed)
[facility] called and needs trans-vag order put in to accompany pelvis/us order. Thank you.

## 2014-01-02 ENCOUNTER — Telehealth: Payer: Self-pay | Admitting: Internal Medicine

## 2014-01-02 NOTE — Telephone Encounter (Signed)
emmi emailed °

## 2014-01-09 ENCOUNTER — Ambulatory Visit
Admission: RE | Admit: 2014-01-09 | Discharge: 2014-01-09 | Disposition: A | Discharge status: Home / Self Care | Payer: PRIVATE HEALTH INSURANCE | Source: Ambulatory Visit | Attending: Internal Medicine | Admitting: Internal Medicine

## 2014-01-09 ENCOUNTER — Encounter: Payer: Self-pay | Admitting: Internal Medicine

## 2014-01-09 DIAGNOSIS — R1900 Intra-abdominal and pelvic swelling, mass and lump, unspecified site: Secondary | ICD-10-CM

## 2014-01-11 ENCOUNTER — Encounter: Payer: Self-pay | Admitting: Internal Medicine

## 2014-01-12 ENCOUNTER — Other Ambulatory Visit: Payer: Self-pay | Admitting: Internal Medicine

## 2014-01-12 DIAGNOSIS — D251 Intramural leiomyoma of uterus: Secondary | ICD-10-CM | POA: Insufficient documentation

## 2014-01-27 ENCOUNTER — Ambulatory Visit: Payer: PRIVATE HEALTH INSURANCE | Admitting: Dietician

## 2014-02-16 ENCOUNTER — Encounter: Payer: Self-pay | Admitting: Internal Medicine

## 2014-02-17 ENCOUNTER — Encounter: Payer: Self-pay | Admitting: Internal Medicine

## 2014-02-17 ENCOUNTER — Other Ambulatory Visit: Payer: Self-pay | Admitting: Internal Medicine

## 2014-02-26 NOTE — Telephone Encounter (Signed)
na

## 2014-03-19 ENCOUNTER — Ambulatory Visit: Payer: Self-pay | Admitting: Gynecology

## 2014-03-31 ENCOUNTER — Other Ambulatory Visit: Payer: Self-pay

## 2014-03-31 MED ORDER — HYDROCHLOROTHIAZIDE 25 MG PO TABS: 25.0000 mg | ORAL_TABLET | Freq: Every day | ORAL | Status: DC

## 2014-04-03 ENCOUNTER — Ambulatory Visit: Payer: PRIVATE HEALTH INSURANCE | Admitting: Internal Medicine

## 2014-04-09 ENCOUNTER — Ambulatory Visit: Payer: Self-pay | Admitting: Gynecology

## 2014-04-16 ENCOUNTER — Telehealth: Payer: Self-pay | Admitting: *Deleted

## 2014-04-16 ENCOUNTER — Ambulatory Visit: Payer: PRIVATE HEALTH INSURANCE | Admitting: Internal Medicine

## 2014-04-16 NOTE — Telephone Encounter (Signed)
[place] Night - Client [facility] Patient Name: Hannah Orr Gender: Female DOB: 05/08/1975 Age: 39 Y 23 M 6 D Return Phone Number: [phone] (Primary) Address: City/State/Zip: [place] [employer] [facility] - Client Client Site [facility] - Night Physician [place], [place] Type Call [place] Name anon Caller Phone Number na Relationship To Patient Self Is this call to report lab results? No Call Type General Information Initial Comment Caller states she needs to cancel an appointment for tomorrow at 9:30 with Dr. Ronnald Ramp. [place] and will call back to reschedule. General Information Type Appointment Nurse Assessment Guidelines Guideline Title Affirmed Question Affirmed Notes Nurse Date/Time (Eastern Time) Disp. Time Eilene Ghazi Time) Disposition Final User 04/15/2014 8:04:34 PM General Information Provided Yes Rayder, Bettey Mare After Care Instructions Given Call Event Type User Date / Time Description

## 2014-04-17 ENCOUNTER — Telehealth: Payer: Self-pay | Admitting: Internal Medicine

## 2014-04-17 ENCOUNTER — Telehealth: Payer: Self-pay | Admitting: *Deleted

## 2014-04-17 NOTE — Telephone Encounter (Signed)
Noted  

## 2014-04-17 NOTE — Telephone Encounter (Signed)
[place] Night - Client [place] [facility] Patient Name: Hannah Orr Gender: Female DOB: 1975/06/05 Age: 39 Y 88 M 6 D Return Phone Number: [phone] (Primary) Address: City/State/Zip: [place] [employer] [facility] - Client Client Site [facility] - Night Physician [place], [place] Type Call [place] Name anon Caller Phone Number na Relationship To Patient Self Is this call to report lab results? No Call Type General Information Initial Comment Caller states she needs to cancel an appointment for tomorrow at 9:30 with Dr. Ronnald Ramp. [place] is [place] and will call back to reschedule. General Information Type Appointment Nurse Assessment Guidelines Guideline Title Affirmed Question Affirmed Notes Nurse Date/Time (Eastern Time) Disp. Time Eilene Ghazi Time) Disposition Final User 04/15/2014 8:04:34 PM General Information Provided Yes Rayder, Bettey Mare After Care Instructions Given Call Event Type User Date / Time Description

## 2014-04-17 NOTE — Telephone Encounter (Signed)
No showed for 3 month fu on 2/18.  Please advise.

## 2014-06-04 ENCOUNTER — Other Ambulatory Visit: Payer: Self-pay | Admitting: Internal Medicine

## 2014-07-06 ENCOUNTER — Other Ambulatory Visit: Payer: Self-pay | Admitting: Internal Medicine

## 2014-07-24 ENCOUNTER — Other Ambulatory Visit: Payer: Self-pay | Admitting: Internal Medicine

## 2014-08-10 ENCOUNTER — Other Ambulatory Visit: Payer: Self-pay | Admitting: Internal Medicine

## 2015-01-19 ENCOUNTER — Other Ambulatory Visit: Payer: Self-pay | Admitting: Internal Medicine

## 2015-02-24 ENCOUNTER — Other Ambulatory Visit: Payer: Self-pay | Admitting: Internal Medicine

## 2015-05-30 IMAGING — CR DG ABDOMEN ACUTE W/ 1V CHEST
5 series · 5 of 5 positions shown · non-contrast
Comparison: Abdominal ultrasound 12/05/2013

CLINICAL DATA: Two week history right back pain and blood in stool

EXAM:
ACUTE ABDOMEN SERIES (ABDOMEN 2 VIEW & CHEST 1 VIEW)

[view not recorded (1 of 5)]
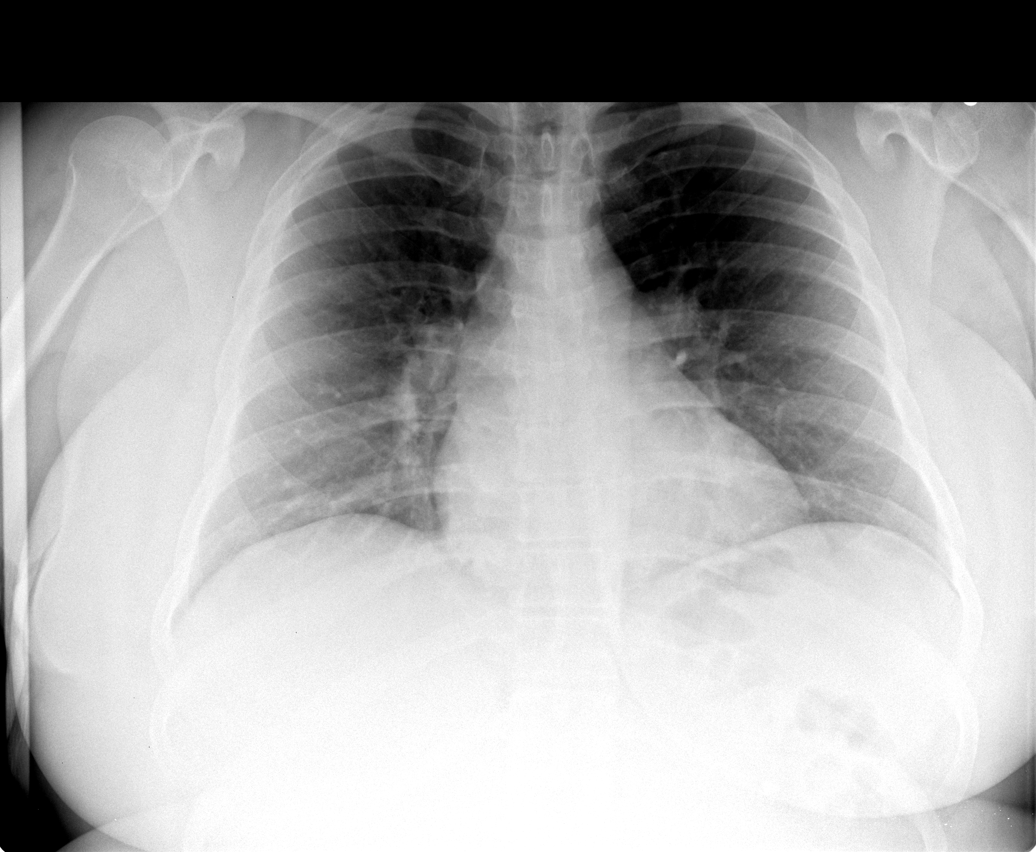

[view not recorded (2 of 5)]
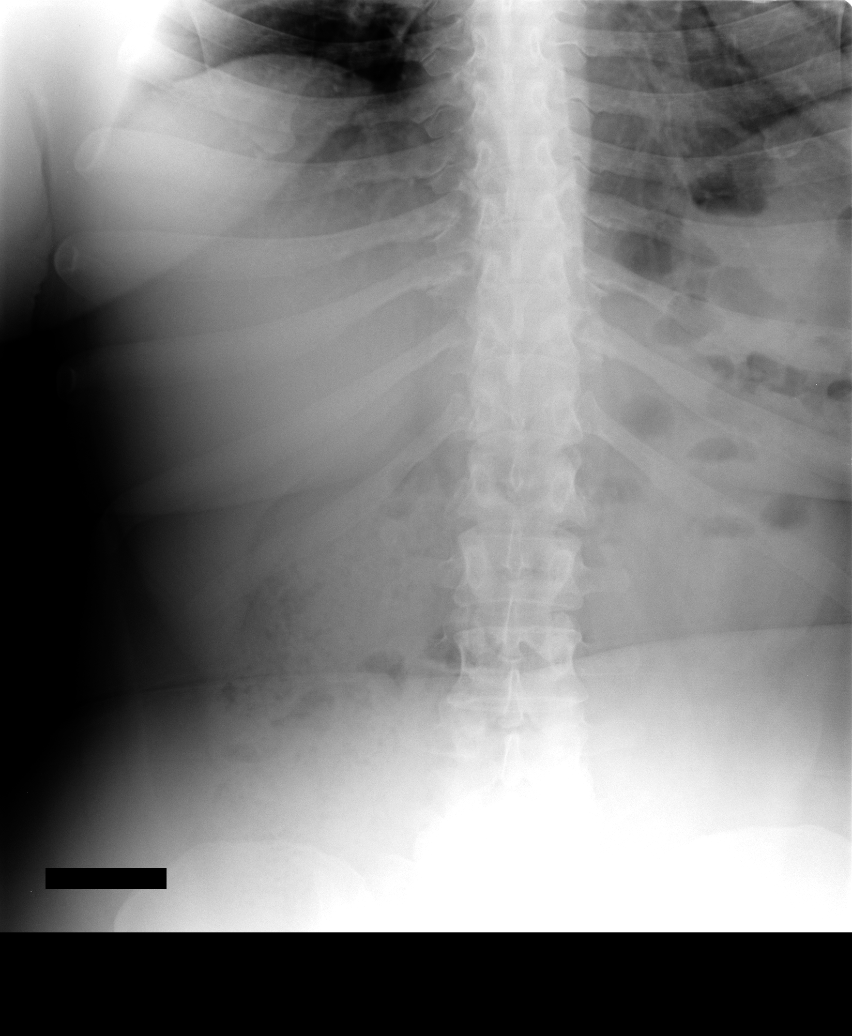

[view not recorded (3 of 5)]
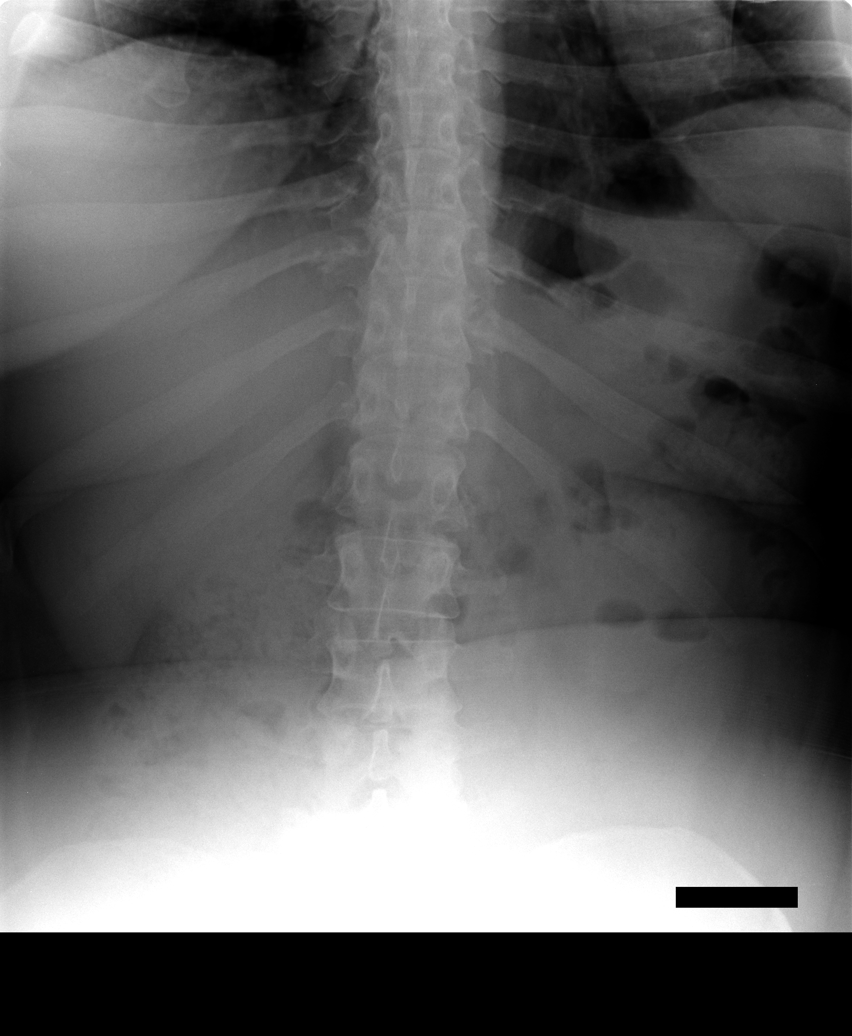

[view not recorded (4 of 5)]
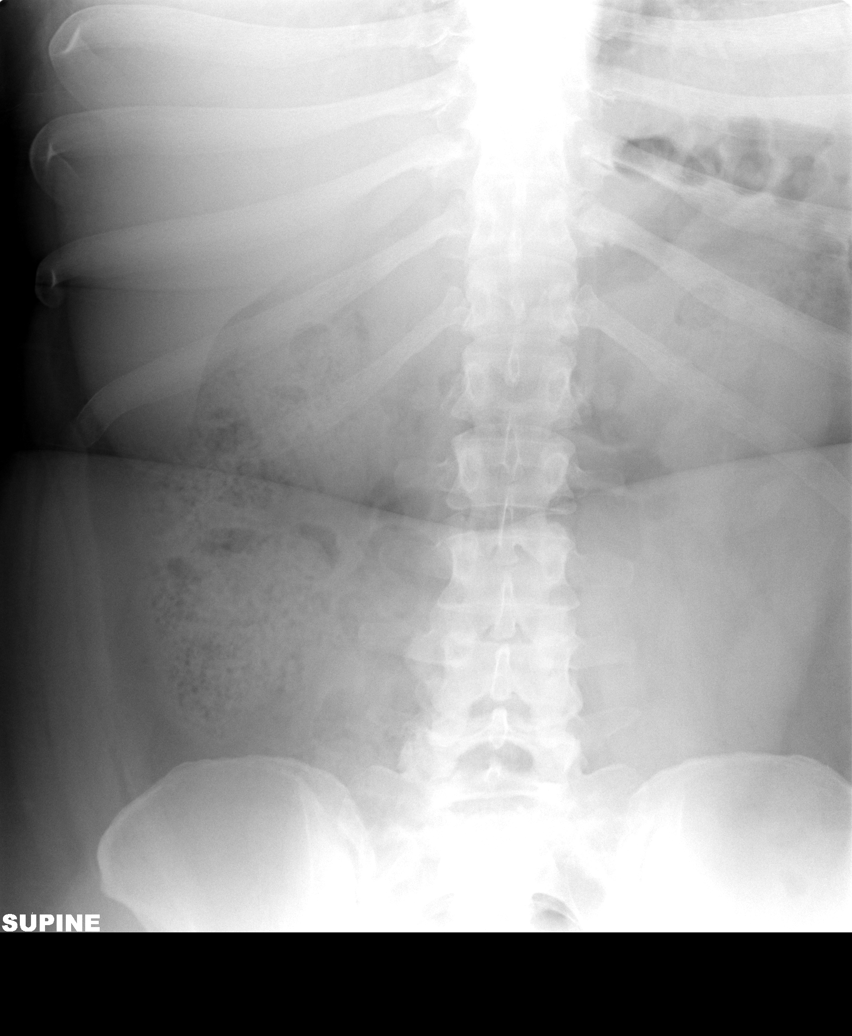

[view not recorded (5 of 5)]
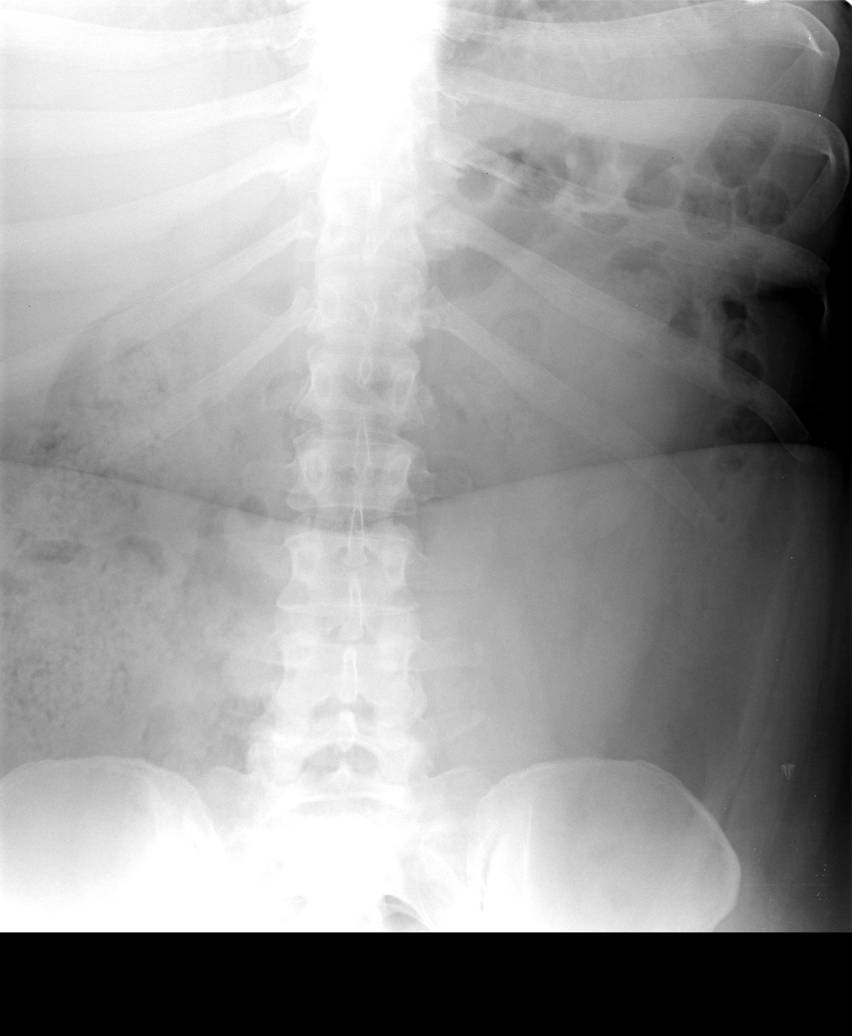

[5 of 5 positions shown; findings below may reference images not displayed]

FINDINGS: There is no evidence of dilated bowel loops or free intraperitoneal
air. No radiopaque calculi or other significant radiographic
abnormality is seen. Heart size and mediastinal contours are within
normal limits. Both lungs are clear.
IMPRESSION: Negative abdominal radiographs.  No acute cardiopulmonary disease.

## 2015-06-22 ENCOUNTER — Other Ambulatory Visit: Payer: Self-pay | Admitting: Internal Medicine

## 2015-07-15 ENCOUNTER — Ambulatory Visit: Payer: PRIVATE HEALTH INSURANCE | Admitting: Family

## 2015-07-18 ENCOUNTER — Other Ambulatory Visit: Payer: Self-pay | Admitting: Internal Medicine

## 2015-08-03 ENCOUNTER — Other Ambulatory Visit: Payer: Self-pay | Admitting: Internal Medicine

## 2015-08-17 ENCOUNTER — Telehealth: Payer: Self-pay | Admitting: Internal Medicine

## 2015-08-17 ENCOUNTER — Other Ambulatory Visit: Payer: Self-pay | Admitting: Internal Medicine

## 2015-08-17 DIAGNOSIS — F32A Depression, unspecified: Secondary | ICD-10-CM

## 2015-08-17 DIAGNOSIS — F329 Major depressive disorder, single episode, unspecified: Secondary | ICD-10-CM

## 2015-08-17 MED ORDER — FLUOXETINE HCL 20 MG PO CAPS: 20.0000 mg | ORAL_CAPSULE | Freq: Every day | ORAL | Status: DC

## 2015-08-17 NOTE — Telephone Encounter (Signed)
Pt request to speak to the assistant concern about medication refill for Prozac. Pt stated she is not sick and last time she saw Webb Silversmith in 2016 ( advise pt we do not see in the system) just for this. Please call her before 5 pm today if possible. Pt has appt on 08/19/15 with Dr. Ronnald Ramp.

## 2015-08-17 NOTE — Telephone Encounter (Signed)
Patient called in to follow up. She advised that she had to r/s to 06-07-22 because of the death of her father. She feels like she doesn't need an appt because she is not sick. i advised that for medications, we require follow up and ultimately it is your license on the line. She is asking for the script to be filled in the meantime.

## 2015-08-17 NOTE — Telephone Encounter (Signed)
Rx sent 

## 2015-08-19 ENCOUNTER — Ambulatory Visit: Payer: PRIVATE HEALTH INSURANCE | Admitting: Internal Medicine

## 2015-08-19 NOTE — Telephone Encounter (Signed)
LVM for pt to call back as soon as possible.   

## 2015-10-08 ENCOUNTER — Encounter: Payer: Self-pay | Admitting: Family

## 2015-10-08 ENCOUNTER — Ambulatory Visit (INDEPENDENT_AMBULATORY_CARE_PROVIDER_SITE_OTHER): Payer: BLUE CROSS/BLUE SHIELD | Admitting: Family

## 2015-10-08 VITALS — BP 144/100 | HR 81 | Temp 97.9°F | Resp 18 | Ht 65.0 in

## 2015-10-08 DIAGNOSIS — I10 Essential (primary) hypertension: Secondary | ICD-10-CM

## 2015-10-08 DIAGNOSIS — M62838 Other muscle spasm: Secondary | ICD-10-CM

## 2015-10-08 DIAGNOSIS — M6249 Contracture of muscle, multiple sites: Secondary | ICD-10-CM

## 2015-10-08 MED ORDER — CYCLOBENZAPRINE HCL 10 MG PO TABS: 5.0000 mg | ORAL_TABLET | Freq: Three times a day (TID) | ORAL | 0 refills | Status: DC | PRN

## 2015-10-08 MED ORDER — HYDROCHLOROTHIAZIDE 25 MG PO TABS: 25.0000 mg | ORAL_TABLET | Freq: Every day | ORAL | 1 refills | Status: DC

## 2015-10-08 NOTE — Assessment & Plan Note (Signed)
Symptoms and exam consistent with cervical paraspinal muscle spasms with no trauma or injury. No radiculopathy. Recommend ice, moist heat, and home exercise therapy. Start cyclobenzaprine as needed for muscle spasms. Follow-up if symptoms worsen or fail to improve.

## 2015-10-08 NOTE — Assessment & Plan Note (Signed)
Blood pressure remains elevated above goal 140/90 although patient indicates she has not taken her medications today. No symptoms of end organ damage and denies worse headache of life. Continue current dosage of hydrochlorothiazide and amlodipine. Encouraged to monitor blood pressures at home. Decrease sodium in nutritional intake.

## 2015-10-08 NOTE — Patient Instructions (Addendum)
Thank you for choosing [facility].  Summary/Instructions:   Moist heat / ice x 20 minutes every 2 hours as needed.  Neck stretches and exercises.   Cyclobenzaprine - 5-10 mg as needed for muscle spasm.   Consider Thermacare.   Continue to your blood pressure medication.  Your prescription(s) have been submitted to your pharmacy or been printed and provided for you. Please take as directed and contact our office if you believe you are having problem(s) with the medication(s) or have any questions.  If your symptoms worsen or fail to improve, please contact our office for further instruction, or in case of emergency go directly to the emergency room at the closest medical facility.    Cervical Strain and Sprain With Rehab Cervical strain and sprain are injuries that commonly occur with "whiplash" injuries. Whiplash occurs when the neck is forcefully whipped backward or forward, such as during a motor vehicle accident or during contact sports. The muscles, ligaments, tendons, discs, and nerves of the neck are susceptible to injury when this occurs. RISK FACTORS Risk of having a whiplash injury increases if:  Osteoarthritis of the spine.  Situations that make head or neck accidents or trauma more likely.  High-risk sports (football, rugby, wrestling, hockey, auto racing, gymnastics, diving, contact karate, or boxing).  Poor strength and flexibility of the neck.  Previous neck injury.  Poor tackling technique.  Improperly fitted or padded equipment. SYMPTOMS   Pain or stiffness in the front or back of neck or both.  Symptoms may present immediately or up to 24 hours after injury.  Dizziness, headache, nausea, and vomiting.  Muscle spasm with soreness and stiffness in the neck.  Tenderness and swelling at the injury site. PREVENTION  Learn and use proper technique (avoid tackling with the head, spearing, and head-butting; use proper falling techniques to avoid  landing on the head).  Warm up and stretch properly before activity.  Maintain physical fitness:  Strength, flexibility, and endurance.  Cardiovascular fitness.  Wear properly fitted and padded protective equipment, such as padded soft collars, for participation in contact sports. PROGNOSIS  Recovery from cervical strain and sprain injuries is dependent on the extent of the injury. These injuries are usually curable in 1 week to 3 months with appropriate treatment.  RELATED COMPLICATIONS   Temporary numbness and weakness may occur if the nerve roots are damaged, and this may persist until the nerve has completely healed.  Chronic pain due to frequent recurrence of symptoms.  Prolonged healing, especially if activity is resumed too soon (before complete recovery). TREATMENT  Treatment initially involves the use of ice and medication to help reduce pain and inflammation. It is also important to perform strengthening and stretching exercises and modify activities that worsen symptoms so the injury does not get worse. These exercises may be performed at home or with a therapist. For patients who experience severe symptoms, a soft, padded collar may be recommended to be worn around the neck.  Improving your posture may help reduce symptoms. Posture improvement includes pulling your chin and abdomen in while sitting or standing. If you are sitting, sit in a firm chair with your buttocks against the back of the chair. While sleeping, try replacing your pillow with a small towel rolled to 2 inches in diameter, or use a cervical pillow or soft cervical collar. Poor sleeping positions delay healing.  For patients with nerve root damage, which causes numbness or weakness, the use of a cervical traction apparatus may be recommended.  Surgery is rarely necessary for these injuries. However, cervical strain and sprains that are present at birth (congenital) may require surgery. MEDICATION   If pain  medication is necessary, nonsteroidal anti-inflammatory medications, such as aspirin and ibuprofen, or other minor pain relievers, such as acetaminophen, are often recommended.  Do not take pain medication for 7 days before surgery.  Prescription pain relievers may be given if deemed necessary by your caregiver. Use only as directed and only as much as you need. HEAT AND COLD:   Cold treatment (icing) relieves pain and reduces inflammation. Cold treatment should be applied for 10 to 15 minutes every 2 to 3 hours for inflammation and pain and immediately after any activity that aggravates your symptoms. Use ice packs or an ice massage.  Heat treatment may be used prior to performing the stretching and strengthening activities prescribed by your caregiver, physical therapist, or athletic trainer. Use a heat pack or a warm soak. SEEK MEDICAL CARE IF:   Symptoms get worse or do not improve in 2 weeks despite treatment.  New, unexplained symptoms develop (drugs used in treatment may produce side effects). EXERCISES RANGE OF MOTION (ROM) AND STRETCHING EXERCISES - Cervical Strain and Sprain These exercises may help you when beginning to rehabilitate your injury. In order to successfully resolve your symptoms, you must improve your posture. These exercises are designed to help reduce the forward-head and rounded-shoulder posture which contributes to this condition. Your symptoms may resolve with or without further involvement from your physician, physical therapist or athletic trainer. While completing these exercises, remember:   Restoring tissue flexibility helps normal motion to return to the joints. This allows healthier, less painful movement and activity.  An effective stretch should be held for at least 20 seconds, although you may need to begin with shorter hold times for comfort.  A stretch should never be painful. You should only feel a gentle lengthening or release in the stretched  tissue. STRETCH- Axial Extensors  Lie on your back on the floor. You may bend your knees for comfort. Place a rolled-up hand towel or dish towel, about 2 inches in diameter, under the part of your head that makes contact with the floor.  Gently tuck your chin, as if trying to make a "double chin," until you feel a gentle stretch at the base of your head.  Hold __________ seconds. Repeat __________ times. Complete this exercise __________ times per day.  STRETCH - Axial Extension   Stand or sit on a firm surface. Assume a good posture: chest up, shoulders drawn back, abdominal muscles slightly tense, knees unlocked (if standing) and feet hip width apart.  Slowly retract your chin so your head slides back and your chin slightly lowers. Continue to look straight ahead.  You should feel a gentle stretch in the back of your head. Be certain not to feel an aggressive stretch since this can cause headaches later.  Hold for __________ seconds. Repeat __________ times. Complete this exercise __________ times per day. STRETCH - Cervical Side Bend   Stand or sit on a firm surface. Assume a good posture: chest up, shoulders drawn back, abdominal muscles slightly tense, knees unlocked (if standing) and feet hip width apart.  Without letting your nose or shoulders move, slowly tip your right / left ear to your shoulder until your feel a gentle stretch in the muscles on the opposite side of your neck.  Hold __________ seconds. Repeat __________ times. Complete this exercise __________ times per day. STRETCH -  Cervical Rotators   Stand or sit on a firm surface. Assume a good posture: chest up, shoulders drawn back, abdominal muscles slightly tense, knees unlocked (if standing) and feet hip width apart.  Keeping your eyes level with the ground, slowly turn your head until you feel a gentle stretch along the back and opposite side of your neck.  Hold __________ seconds. Repeat __________ times.  Complete this exercise __________ times per day. RANGE OF MOTION - Neck Circles   Stand or sit on a firm surface. Assume a good posture: chest up, shoulders drawn back, abdominal muscles slightly tense, knees unlocked (if standing) and feet hip width apart.  Gently roll your head down and around from the back of one shoulder to the back of the other. The motion should never be forced or painful.  Repeat the motion 10-20 times, or until you feel the neck muscles relax and loosen. Repeat __________ times. Complete the exercise __________ times per day. STRENGTHENING EXERCISES - Cervical Strain and Sprain These exercises may help you when beginning to rehabilitate your injury. They may resolve your symptoms with or without further involvement from your physician, physical therapist, or athletic trainer. While completing these exercises, remember:   Muscles can gain both the endurance and the strength needed for everyday activities through controlled exercises.  Complete these exercises as instructed by your physician, physical therapist, or athletic trainer. Progress the resistance and repetitions only as guided.  You may experience muscle soreness or fatigue, but the pain or discomfort you are trying to eliminate should never worsen during these exercises. If this pain does worsen, stop and make certain you are following the directions exactly. If the pain is still present after adjustments, discontinue the exercise until you can discuss the trouble with your clinician. STRENGTH - Cervical Flexors, Isometric  Face a wall, standing about 6 inches away. Place a small pillow, a ball about 6-8 inches in diameter, or a folded towel between your forehead and the wall.  Slightly tuck your chin and gently push your forehead into the soft object. Push only with mild to moderate intensity, building up tension gradually. Keep your jaw and forehead relaxed.  Hold 10 to 20 seconds. Keep your breathing  relaxed.  Release the tension slowly. Relax your neck muscles completely before you start the next repetition. Repeat __________ times. Complete this exercise __________ times per day. STRENGTH- Cervical Lateral Flexors, Isometric   Stand about 6 inches away from a wall. Place a small pillow, a ball about 6-8 inches in diameter, or a folded towel between the side of your head and the wall.  Slightly tuck your chin and gently tilt your head into the soft object. Push only with mild to moderate intensity, building up tension gradually. Keep your jaw and forehead relaxed.  Hold 10 to 20 seconds. Keep your breathing relaxed.  Release the tension slowly. Relax your neck muscles completely before you start the next repetition. Repeat __________ times. Complete this exercise __________ times per day. STRENGTH - Cervical Extensors, Isometric   Stand about 6 inches away from a wall. Place a small pillow, a ball about 6-8 inches in diameter, or a folded towel between the back of your head and the wall.  Slightly tuck your chin and gently tilt your head back into the soft object. Push only with mild to moderate intensity, building up tension gradually. Keep your jaw and forehead relaxed.  Hold 10 to 20 seconds. Keep your breathing relaxed.  Release the tension  slowly. Relax your neck muscles completely before you start the next repetition. Repeat __________ times. Complete this exercise __________ times per day. POSTURE AND BODY MECHANICS CONSIDERATIONS - Cervical Strain and Sprain Keeping correct posture when sitting, standing or completing your activities will reduce the stress put on different body tissues, allowing injured tissues a chance to heal and limiting painful experiences. The following are general guidelines for improved posture. Your physician or physical therapist will provide you with any instructions specific to your needs. While reading these guidelines, remember:  The exercises  prescribed by your provider will help you have the flexibility and strength to maintain correct postures.  The correct posture provides the optimal environment for your joints to work. All of your joints have less wear and tear when properly supported by a spine with good posture. This means you will experience a healthier, less painful body.  Correct posture must be practiced with all of your activities, especially prolonged sitting and standing. Correct posture is as important when doing repetitive low-stress activities (typing) as it is when doing a single heavy-load activity (lifting). PROLONGED STANDING WHILE SLIGHTLY LEANING FORWARD When completing a task that requires you to lean forward while standing in one place for a long time, place either foot up on a stationary 2- to 4-inch high object to help maintain the best posture. When both feet are on the ground, the low back tends to lose its slight inward curve. If this curve flattens (or becomes too large), then the back and your other joints will experience too much stress, fatigue more quickly, and can cause pain.  RESTING POSITIONS Consider which positions are most painful for you when choosing a resting position. If you have pain with flexion-based activities (sitting, bending, stooping, squatting), choose a position that allows you to rest in a less flexed posture. You would want to avoid curling into a fetal position on your side. If your pain worsens with extension-based activities (prolonged standing, working overhead), avoid resting in an extended position such as sleeping on your stomach. Most people will find more comfort when they rest with their spine in a more neutral position, neither too rounded nor too arched. Lying on a non-sagging bed on your side with a pillow between your knees, or on your back with a pillow under your knees will often provide some relief. Keep in mind, being in any one position for a prolonged period of time, no  matter how correct your posture, can still lead to stiffness. WALKING Walk with an upright posture. Your ears, shoulders, and hips should all line up. OFFICE WORK When working at a desk, create an environment that supports good, upright posture. Without extra support, muscles fatigue and lead to excessive strain on joints and other tissues. CHAIR:  A chair should be able to slide under your desk when your back makes contact with the back of the chair. This allows you to work closely.  The chair's height should allow your eyes to be level with the upper part of your monitor and your hands to be slightly lower than your elbows.  Body position:  Your feet should make contact with the floor. If this is not possible, use a foot rest.  Keep your ears over your shoulders. This will reduce stress on your neck and low back.   This information is not intended to replace advice given to you by your health care provider. Make sure you discuss any questions you have with your health care provider.  Document Released: 02/13/2005 Document Revised: 03/06/2014 Document Reviewed: 05/28/2008 Elsevier Interactive Patient Education [employer].

## 2015-10-08 NOTE — Progress Notes (Signed)
Subjective:    Patient ID: Hannah Orr, female    DOB: Jun 02, 1975, 40 y.o.   MRN: [id number]  Chief Complaint  Patient presents with  . Neck Pain    [facility]z refill and also has a stiff neck the pain goes down in her shoulders    HPI:  Hannah Orr is a 40 y.o. female who  has a past medical history of Diabetes mellitus and Hypertension. and presents today for an office visit.    1.) Hypertension - Currently maintained on amlodipine and hydrochlorothiazide. Reports taking medication as prescribed and denies adverse side effects. Requesting refill of hydrochlorothiazide. Blood pressures at home have been below goal of 140/90. Denies symptoms of end organ damage or worst headache of life. Following a low sodium diet.    BP Readings from Last 3 Encounters:  10/08/15 (!) 144/100  01/01/14 136/80  12/12/13 126/80    2.) Neck pain - This is a new problem. Associated symptom of stiffness and pain located in her neck and radiates down to her shoulders and has been going on for about 1 day starting when she woke up. Denies any trauma or injury. Denies radiculopathy or numbness/tingling. Does have decreased range of motion. Severity is enough to effect her sleep secondary to discomfort. Modifying factors include ibuprofen which does help with her symptoms.  Allergies  Allergen Reactions  . Benazepril Hcl     cough     Current Outpatient Prescriptions on File Prior to Visit  Medication Sig Dispense Refill  . albuterol (PROVENTIL HFA;VENTOLIN HFA) 108 (90 BASE) MCG/ACT inhaler Inhale 2 puffs into the lungs every 6 (six) hours as needed for wheezing. 1 Inhaler 0  . amLODipine (NORVASC) 10 MG tablet TAKE 1 TABLET BY MOUTH DAILY 30 tablet 5  . FLUoxetine (PROZAC) 20 MG capsule Take 1 capsule (20 mg total) by mouth daily. 90 capsule 1  . Multiple Vitamins-Calcium (ONE-A-DAY WOMENS PO) Take 1 each by mouth daily.      . naproxen (NAPROSYN) 375 MG tablet TAKE 1 TABLET (375 MG TOTAL) BY  MOUTH 2 (TWO) TIMES DAILY WITH A MEAL. 60 tablet 3   No current facility-administered medications on file prior to visit.      No past surgical history on file.   Past Medical History:  Diagnosis Date  . Diabetes mellitus   . Hypertension     Review of Systems  Constitutional: Negative for chills and fever.  Eyes:       Negative for changes in vision  Respiratory: Negative for cough, chest tightness and wheezing.   Cardiovascular: Negative for chest pain, palpitations and leg swelling.  Musculoskeletal: Positive for neck pain and neck stiffness.  Neurological: Negative for dizziness, weakness, light-headedness and numbness.      Objective:    BP (!) 144/100 (BP Location: Left Arm, Patient Position: Sitting, Cuff Size: Large)   Pulse 81   Temp 97.9 F (36.6 C) (Oral)   Resp 18   Ht 5\' 5"  (1.651 m)   SpO2 97%  Nursing note and vital signs reviewed.  Physical Exam  Constitutional: She is oriented to person, place, and time. She appears well-developed and well-nourished. No distress.  Neck:  No obvious deformity, discoloration, or edema noted. Palpable tenderness along cervical paraspinal musculature. No deformities or crepitus. Significantly decreased range of motion in rotation and lateral bending. Cervical compression test is negative. Spurling's test is negative. Distal pulses and sensation are intact and appropriate.  Cardiovascular: Normal rate, regular rhythm,  normal heart sounds and intact distal pulses.   Pulmonary/Chest: Effort normal and breath sounds normal.  Lymphadenopathy:    She has no cervical adenopathy.  Neurological: She is alert and oriented to person, place, and time.  Skin: Skin is warm and dry.  Psychiatric: She has a normal mood and affect. Her behavior is normal. Judgment and thought content normal.       Assessment & Plan:   Problem List Items Addressed This Visit      Cardiovascular and Mediastinum   Essential hypertension, benign -  Primary    Blood pressure remains elevated above goal 140/90 although patient indicates she has not taken her medications today. No symptoms of end organ damage and denies worse headache of life. Continue current dosage of hydrochlorothiazide and amlodipine. Encouraged to monitor blood pressures at home. Decrease sodium in nutritional intake.      Relevant Medications   hydrochlorothiazide (HYDRODIURIL) 25 MG tablet     Other   Cervical paraspinal muscle spasm    Symptoms and exam consistent with cervical paraspinal muscle spasms with no trauma or injury. No radiculopathy. Recommend ice, moist heat, and home exercise therapy. Start cyclobenzaprine as needed for muscle spasms. Follow-up if symptoms worsen or fail to improve.      Relevant Medications   cyclobenzaprine (FLEXERIL) 10 MG tablet    Other Visit Diagnoses   None.      I have discontinued Hannah Orr's dexlansoprazole and HYDROcodone-acetaminophen. I am also having her start on cyclobenzaprine. Additionally, I am having her maintain her Multiple Vitamins-Calcium (ONE-A-DAY WOMENS PO), albuterol, naproxen, amLODipine, FLUoxetine, and hydrochlorothiazide.   Meds ordered this encounter  Medications  . hydrochlorothiazide (HYDRODIURIL) 25 MG tablet    Sig: Take 1 tablet (25 mg total) by mouth daily.    Dispense:  90 tablet    Refill:  1    CYCLE FILL MEDICATION.    Order Specific Question:   Supervising Provider    Answer:   Pricilla Holm A [id number]  . cyclobenzaprine (FLEXERIL) 10 MG tablet    Sig: Take 0.5-1 tablets (5-10 mg total) by mouth 3 (three) times daily as needed for muscle spasms.    Dispense:  40 tablet    Refill:  0    Order Specific Question:   Supervising Provider    Answer:   Pricilla Holm A [id number]     Follow-up: Return if symptoms worsen or fail to improve.  Mauricio Po, FNP

## 2015-11-03 DIAGNOSIS — Z23 Encounter for immunization: Secondary | ICD-10-CM | POA: Diagnosis not present

## 2015-12-15 ENCOUNTER — Encounter: Payer: Self-pay | Admitting: Nurse Practitioner

## 2015-12-15 ENCOUNTER — Ambulatory Visit (INDEPENDENT_AMBULATORY_CARE_PROVIDER_SITE_OTHER): Payer: BLUE CROSS/BLUE SHIELD | Admitting: Nurse Practitioner

## 2015-12-15 VITALS — BP 142/86 | Temp 97.6°F | Ht 66.0 in | Wt 275.0 lb

## 2015-12-15 DIAGNOSIS — G5601 Carpal tunnel syndrome, right upper limb: Secondary | ICD-10-CM | POA: Diagnosis not present

## 2015-12-15 MED ORDER — DICLOFENAC SODIUM 2 % TD SOLN: 1.0000 "application " | Freq: Two times a day (BID) | TRANSDERMAL | 0 refills | Status: DC

## 2015-12-15 MED ORDER — METHYLPREDNISOLONE ACETATE 80 MG/ML IJ SUSP: 80.0000 mg | Freq: Once | INTRAMUSCULAR | Status: DC

## 2015-12-15 MED ORDER — PREDNISONE 10 MG (21) PO TBPK: 10.0000 mg | ORAL_TABLET | ORAL | 0 refills | Status: DC

## 2015-12-15 NOTE — Progress Notes (Signed)
Pre visit review using our clinic review tool, if applicable. No additional management support is needed unless otherwise documented below in the visit note. 

## 2015-12-15 NOTE — Progress Notes (Signed)
Subjective:  Patient ID: Hannah Orr, female    DOB: 08-Feb-1976  Age: 40 y.o. MRN: [id number]  CC: Wrist Pain (Pt stated Rt wrist having numbness feeling/pain for about 1 month.)   Wrist Pain   The pain is present in the right wrist. This is a new problem. The current episode started 1 to 4 weeks ago. There has been no history of extremity trauma. The problem occurs constantly. The problem has been gradually worsening. The quality of the pain is described as dull, pounding and aching. The pain is at a severity of 9/10. The pain is severe. Associated symptoms include an inability to bear weight, a limited range of motion, numbness and tingling. Pertinent negatives include no fever, itching, joint locking or joint swelling. The symptoms are aggravated by activity. She has tried rest for the symptoms. The treatment provided no relief. Family history does not include gout. There is no history of diabetes, gout, osteoarthritis or rheumatoid arthritis.  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 She is right hand dorminant.  Outpatient Medications Prior to Visit  Medication Sig Dispense Refill  . albuterol (PROVENTIL HFA;VENTOLIN HFA) 108 (90 BASE) MCG/ACT inhaler Inhale 2 puffs into the lungs every 6 (six) hours as needed for wheezing. 1 Inhaler 0  . amLODipine (NORVASC) 10 MG tablet TAKE 1 TABLET BY MOUTH DAILY 30 tablet 5  . FLUoxetine (PROZAC) 20 MG capsule Take 1 capsule (20 mg total) by mouth daily. 90 capsule 1  . hydrochlorothiazide (HYDRODIURIL) 25 MG tablet Take 1 tablet (25 mg total) by mouth daily. 90 tablet 1  . Multiple Vitamins-Calcium (ONE-A-DAY WOMENS PO) Take 1 each by mouth daily.      . cyclobenzaprine (FLEXERIL) 10 MG tablet Take 0.5-1 tablets (5-10 mg total) by mouth 3 (three) times daily as needed for muscle spasms. 40 tablet 0  . naproxen (NAPROSYN) 375 MG tablet TAKE 1 TABLET (375 MG TOTAL) BY MOUTH 2 (TWO) TIMES DAILY WITH  A MEAL. 60 tablet 3   No facility-administered medications prior to visit.     ROS See HPI  Objective:  BP (!) 142/86 (BP Location: Left Arm, Patient Position: Sitting, Cuff Size: Large)   Temp 97.6 F (36.4 C)   Ht 5\' 6"  (1.676 m)   Wt 275 lb (124.7 kg)   SpO2 97%   BMI 44.39 kg/m   BP Readings from Last 3 Encounters:  12/15/15 (!) 142/86  10/08/15 (!) 144/100  01/01/14 136/80    Wt Readings from Last 3 Encounters:  12/15/15 275 lb (124.7 kg)  12/12/13 275 lb (124.7 kg)  11/24/13 275 lb (124.7 kg)    Physical Exam  Constitutional: No distress.  Neck: Normal range of motion. Neck supple. No thyromegaly present.  Cardiovascular: Normal rate.   Pulmonary/Chest: Effort normal.  Musculoskeletal: She exhibits edema and tenderness.       Right shoulder: Normal.       Right elbow: Normal.      Right wrist: She exhibits tenderness and swelling. She exhibits no effusion.       Cervical back: Normal.       Right hand: She exhibits normal range of motion and no tenderness. Normal sensation noted. Normal strength noted.  Pain with wrist extension which triggered numbness over thumb, first and middle finger.  Lymphadenopathy:    She has no cervical adenopathy.  Neurological: She is alert.  Vitals reviewed.   Lab Results  Component Value Date  WBC 3.2 (L) 11/24/2013   HGB 13.6 11/24/2013   HCT 40.5 11/24/2013   PLT 589.0 (H) 11/24/2013   GLUCOSE 102 (H) 11/24/2013   CHOL 186 10/17/2012   TRIG 115.0 10/17/2012   HDL 72.00 10/17/2012   LDLCALC 91 10/17/2012   ALT 42 (H) 11/24/2013   AST 30 11/24/2013   NA 132 (L) 11/24/2013   K 3.8 11/24/2013   CL 100 11/24/2013   CREATININE 0.8 11/24/2013   BUN 9 11/24/2013   CO2 23 11/24/2013   TSH 2.97 10/17/2012   HGBA1C 5.3 08/08/2013    US Transvaginal Non-ob  Result Date: 01/09/2014 CLINICAL DATA:  Left adnexal fullness on CT scan. EXAM: TRANSABDOMINAL AND TRANSVAGINAL ULTRASOUND OF PELVIS TECHNIQUE: Both  transabdominal and transvaginal ultrasound examinations of the pelvis were performed. Transabdominal technique was performed for global imaging of the pelvis including uterus, ovaries, adnexal regions, and pelvic cul-de-sac. It was necessary to proceed with endovaginal exam following the transabdominal exam to visualize the uterus and adnexal regions. COMPARISON:  CT 12/23/2013. FINDINGS: Uterus Measurements: 7.7 x 3.8 x 4.4 cm. 5.6 x 5.1 x 4.9 cm large fundal fibroid. Endometrium Thickness: 5.6 mm.  No focal abnormality visualized. Right ovary Measurements: 2.5 x 2.2 x 3.4 cm. Normal appearance/no adnexal mass. Left ovary Measurements: 2.8 x 2.6 x 2.3 cm. Normal appearance/no adnexal mass. Other findings No free fluid. IMPRESSION: 1. Large uterine fibroid measuring 5.6 cm in maximum diameter. Fibroids in the fundus. 2. No evidence of adnexal or ovarian mass. Electronically Signed   By: Marcello Moores  Register   On: 01/09/2014 10:58   US Pelvis Complete  Result Date: 01/09/2014 CLINICAL DATA:  Left adnexal fullness on CT scan. EXAM: TRANSABDOMINAL AND TRANSVAGINAL ULTRASOUND OF PELVIS TECHNIQUE: Both transabdominal and transvaginal ultrasound examinations of the pelvis were performed. Transabdominal technique was performed for global imaging of the pelvis including uterus, ovaries, adnexal regions, and pelvic cul-de-sac. It was necessary to proceed with endovaginal exam following the transabdominal exam to visualize the uterus and adnexal regions. COMPARISON:  CT 12/23/2013. FINDINGS: Uterus Measurements: 7.7 x 3.8 x 4.4 cm. 5.6 x 5.1 x 4.9 cm large fundal fibroid. Endometrium Thickness: 5.6 mm.  No focal abnormality visualized. Right ovary Measurements: 2.5 x 2.2 x 3.4 cm. Normal appearance/no adnexal mass. Left ovary Measurements: 2.8 x 2.6 x 2.3 cm. Normal appearance/no adnexal mass. Other findings No free fluid. IMPRESSION: 1. Large uterine fibroid measuring 5.6 cm in maximum diameter. Fibroids in the fundus. 2.  No evidence of adnexal or ovarian mass. Electronically Signed   By: Marcello Moores  Register   On: 01/09/2014 10:58    Assessment & Plan:   Amazin was seen today for wrist pain.  Diagnoses and all orders for this visit:  Carpal tunnel syndrome of right wrist -     predniSONE (STERAPRED UNI-PAK 21 TAB) 10 MG (21) TBPK tablet; Take 1 tablet (10 mg total) by mouth as directed. With food -     methylPREDNISolone acetate (DEPO-MEDROL) injection 80 mg; Inject 1 mL (80 mg total) into the muscle once. -     Discontinue: Diclofenac Sodium (PENNSAID) 2 % SOLN; Place 1 application onto the skin 2 times daily at 12 noon and 4 pm. -     Diclofenac Sodium (PENNSAID) 2 % SOLN; Place 1 application onto the skin 2 times daily at 12 noon and 4 pm.   I have discontinued Ms. Pettey's naproxen and cyclobenzaprine. I am also having her start on predniSONE. Additionally, I am having her maintain  her Multiple Vitamins-Calcium (ONE-A-DAY WOMENS PO), albuterol, amLODipine, FLUoxetine, hydrochlorothiazide, and Diclofenac Sodium. We will continue to administer methylPREDNISolone acetate.  Meds ordered this encounter  Medications  . predniSONE (STERAPRED UNI-PAK 21 TAB) 10 MG (21) TBPK tablet    Sig: Take 1 tablet (10 mg total) by mouth as directed. With food    Dispense:  21 tablet    Refill:  0    Order Specific Question:   Supervising Provider    Answer:   Cassandria Anger [[id number]]  . methylPREDNISolone acetate (DEPO-MEDROL) injection 80 mg  . DISCONTD: Diclofenac Sodium (PENNSAID) 2 % SOLN    Sig: Place 1 application onto the skin 2 times daily at 12 noon and 4 pm.    Dispense:  2 g    Refill:  0    Order Specific Question:   Supervising Provider    Answer:   Cassandria Anger [[id number]]  . Diclofenac Sodium (PENNSAID) 2 % SOLN    Sig: Place 1 application onto the skin 2 times daily at 12 noon and 4 pm.    Dispense:  2 g    Refill:  0    Order Specific Question:   Supervising Provider    Answer:   Cassandria Anger [[id number]]    Follow-up: Return if symptoms worsen or fail to improve.  Wilfred Lacy, NP

## 2015-12-15 NOTE — Patient Instructions (Addendum)
Patient provided with wrist brace while in office. Wear brace during day and off at night. Start oral prednisone tomorrow. Return to office if no improvement in 2weeks.  Carpal Tunnel Syndrome Carpal tunnel syndrome is a condition that causes pain in your hand and arm. The carpal tunnel is a narrow area that is on the palm side of your wrist. Repeated wrist motion or certain diseases may cause swelling in the tunnel. This swelling can pinch the main nerve in the wrist (median nerve).  HOME CARE If You Have a Splint:  Wear it as told by your doctor. Remove it only as told by your doctor.  Loosen the splint if your fingers:  Become numb and tingle.  Turn blue and cold.  Keep the splint clean and dry. General Instructions  Take over-the-counter and prescription medicines only as told by your doctor.  Rest your wrist from any activity that may be causing your pain. If needed, talk to your employer about changes that can be made in your work, such as getting a wrist pad to use while typing.  If directed, apply ice to the painful area:  Put ice in a plastic bag.  Place a towel between your skin and the bag.  Leave the ice on for 20 minutes, 2-3 times per day.  Keep all follow-up visits as told by your doctor. This is important.  Do any exercises as told by your doctor, physical therapist, or occupational therapist. GET HELP IF:  You have new symptoms.  Medicine does not help your pain.  Your symptoms get worse.   This information is not intended to replace advice given to you by your health care provider. Make sure you discuss any questions you have with your health care provider.   Document Released: 02/02/2011 Document Revised: 11/04/2014 Document Reviewed: 07/01/2014 Elsevier Interactive Patient Education [employer].

## 2015-12-27 ENCOUNTER — Other Ambulatory Visit: Payer: Self-pay | Admitting: Internal Medicine

## 2016-02-09 ENCOUNTER — Encounter: Payer: Self-pay | Admitting: Nurse Practitioner

## 2016-02-09 ENCOUNTER — Ambulatory Visit (INDEPENDENT_AMBULATORY_CARE_PROVIDER_SITE_OTHER)
Admission: RE | Admit: 2016-02-09 | Discharge: 2016-02-09 | Disposition: A | Discharge status: Home / Self Care | Payer: BLUE CROSS/BLUE SHIELD | Source: Ambulatory Visit | Attending: Nurse Practitioner | Admitting: Nurse Practitioner

## 2016-02-09 ENCOUNTER — Ambulatory Visit (INDEPENDENT_AMBULATORY_CARE_PROVIDER_SITE_OTHER): Payer: BLUE CROSS/BLUE SHIELD | Admitting: Nurse Practitioner

## 2016-02-09 VITALS — BP 146/88 | HR 103 | Temp 98.4°F | Ht 65.0 in | Wt 327.0 lb

## 2016-02-09 DIAGNOSIS — J209 Acute bronchitis, unspecified: Secondary | ICD-10-CM

## 2016-02-09 DIAGNOSIS — R059 Cough, unspecified: Secondary | ICD-10-CM

## 2016-02-09 DIAGNOSIS — R0602 Shortness of breath: Secondary | ICD-10-CM | POA: Diagnosis not present

## 2016-02-09 DIAGNOSIS — R05 Cough: Secondary | ICD-10-CM | POA: Diagnosis not present

## 2016-02-09 DIAGNOSIS — J0140 Acute pansinusitis, unspecified: Secondary | ICD-10-CM

## 2016-02-09 MED ORDER — ALBUTEROL SULFATE (2.5 MG/3ML) 0.083% IN NEBU
2.5000 mg | INHALATION_SOLUTION | Freq: Once | RESPIRATORY_TRACT | Status: AC
  Administered 2016-02-09: 2.5 mg via RESPIRATORY_TRACT

## 2016-02-09 MED ORDER — OXYMETAZOLINE HCL 0.05 % NA SOLN: 1.0000 | Freq: Two times a day (BID) | NASAL | 0 refills | Status: DC

## 2016-02-09 MED ORDER — BENZONATATE 100 MG PO CAPS: 100.0000 mg | ORAL_CAPSULE | Freq: Three times a day (TID) | ORAL | 0 refills | Status: DC | PRN

## 2016-02-09 MED ORDER — CEFDINIR 300 MG PO CAPS: 300.0000 mg | ORAL_CAPSULE | Freq: Two times a day (BID) | ORAL | 0 refills | Status: DC

## 2016-02-09 MED ORDER — FLUTICASONE PROPIONATE 50 MCG/ACT NA SUSP: 2.0000 | Freq: Every day | NASAL | 0 refills | Status: DC

## 2016-02-09 MED ORDER — ALBUTEROL SULFATE HFA 108 (90 BASE) MCG/ACT IN AERS: 2.0000 | INHALATION_SPRAY | Freq: Four times a day (QID) | RESPIRATORY_TRACT | 0 refills | Status: DC | PRN

## 2016-02-09 MED ORDER — GUAIFENESIN ER 600 MG PO TB12: 600.0000 mg | ORAL_TABLET | Freq: Two times a day (BID) | ORAL | 0 refills | Status: DC | PRN

## 2016-02-09 NOTE — Progress Notes (Signed)
Subjective:  Patient ID: Hannah Orr, female    DOB: 12-15-1975  Age: 40 y.o. MRN: [id number]  CC: Cough (coughing yellow mucus,SOB,chest congestion for 2 wks. took cough med OTC)  HPI  Outpatient Medications Prior to Visit  Medication Sig Dispense Refill  . amLODipine (NORVASC) 10 MG tablet TAKE 1 TABLET BY MOUTH DAILY 30 tablet 4  . Diclofenac Sodium (PENNSAID) 2 % SOLN Place 1 application onto the skin 2 times daily at 12 noon and 4 pm. 2 g 0  . FLUoxetine (PROZAC) 20 MG capsule Take 1 capsule (20 mg total) by mouth daily. 90 capsule 1  . hydrochlorothiazide (HYDRODIURIL) 25 MG tablet Take 1 tablet (25 mg total) by mouth daily. 90 tablet 1  . Multiple Vitamins-Calcium (ONE-A-DAY WOMENS PO) Take 1 each by mouth daily.      Marland Kitchen albuterol (PROVENTIL HFA;VENTOLIN HFA) 108 (90 BASE) MCG/ACT inhaler Inhale 2 puffs into the lungs every 6 (six) hours as needed for wheezing. 1 Inhaler 0  . predniSONE (STERAPRED UNI-PAK 21 TAB) 10 MG (21) TBPK tablet Take 1 tablet (10 mg total) by mouth as directed. With food 21 tablet 0   Facility-Administered Medications Prior to Visit  Medication Dose Route Frequency Provider Last Rate Last Dose  . methylPREDNISolone acetate (DEPO-MEDROL) injection 80 mg  80 mg Intramuscular Once Flossie Buffy, NP        ROS See HPI  Objective:  BP (!) 146/88   Pulse (!) 103   Temp 98.4 F (36.9 C)   Ht 5\' 5"  (1.651 m)   Wt (!) 327 lb (148.3 kg)   LMP 01/16/2016 (Approximate)   SpO2 94%   BMI 54.42 kg/m   BP Readings from Last 3 Encounters:  02/09/16 (!) 146/88  12/15/15 (!) 142/86  10/08/15 (!) 144/100    Wt Readings from Last 3 Encounters:  02/09/16 (!) 327 lb (148.3 kg)  12/15/15 275 lb (124.7 kg)  12/12/13 275 lb (124.7 kg)    Physical Exam  Lab Results  Component Value Date   WBC 3.2 (L) 11/24/2013   HGB 13.6 11/24/2013   HCT 40.5 11/24/2013   PLT 589.0 (H) 11/24/2013   GLUCOSE 102 (H) 11/24/2013   CHOL 186 10/17/2012   TRIG  115.0 10/17/2012   HDL 72.00 10/17/2012   LDLCALC 91 10/17/2012   ALT 42 (H) 11/24/2013   AST 30 11/24/2013   NA 132 (L) 11/24/2013   K 3.8 11/24/2013   CL 100 11/24/2013   CREATININE 0.8 11/24/2013   BUN 9 11/24/2013   CO2 23 11/24/2013   TSH 2.97 10/17/2012   HGBA1C 5.3 08/08/2013    US Transvaginal Non-ob  Result Date: 01/09/2014 CLINICAL DATA:  Left adnexal fullness on CT scan. EXAM: TRANSABDOMINAL AND TRANSVAGINAL ULTRASOUND OF PELVIS TECHNIQUE: Both transabdominal and transvaginal ultrasound examinations of the pelvis were performed. Transabdominal technique was performed for global imaging of the pelvis including uterus, ovaries, adnexal regions, and pelvic cul-de-sac. It was necessary to proceed with endovaginal exam following the transabdominal exam to visualize the uterus and adnexal regions. COMPARISON:  CT 12/23/2013. FINDINGS: Uterus Measurements: 7.7 x 3.8 x 4.4 cm. 5.6 x 5.1 x 4.9 cm large fundal fibroid. Endometrium Thickness: 5.6 mm.  No focal abnormality visualized. Right ovary Measurements: 2.5 x 2.2 x 3.4 cm. Normal appearance/no adnexal mass. Left ovary Measurements: 2.8 x 2.6 x 2.3 cm. Normal appearance/no adnexal mass. Other findings No free fluid. IMPRESSION: 1. Large uterine fibroid measuring 5.6 cm in maximum diameter. Fibroids  in the fundus. 2. No evidence of adnexal or ovarian mass. Electronically Signed   By: Marcello Moores  Register   On: 01/09/2014 10:58   US Pelvis Complete  Result Date: 01/09/2014 CLINICAL DATA:  Left adnexal fullness on CT scan. EXAM: TRANSABDOMINAL AND TRANSVAGINAL ULTRASOUND OF PELVIS TECHNIQUE: Both transabdominal and transvaginal ultrasound examinations of the pelvis were performed. Transabdominal technique was performed for global imaging of the pelvis including uterus, ovaries, adnexal regions, and pelvic cul-de-sac. It was necessary to proceed with endovaginal exam following the transabdominal exam to visualize the uterus and adnexal regions.  COMPARISON:  CT 12/23/2013. FINDINGS: Uterus Measurements: 7.7 x 3.8 x 4.4 cm. 5.6 x 5.1 x 4.9 cm large fundal fibroid. Endometrium Thickness: 5.6 mm.  No focal abnormality visualized. Right ovary Measurements: 2.5 x 2.2 x 3.4 cm. Normal appearance/no adnexal mass. Left ovary Measurements: 2.8 x 2.6 x 2.3 cm. Normal appearance/no adnexal mass. Other findings No free fluid. IMPRESSION: 1. Large uterine fibroid measuring 5.6 cm in maximum diameter. Fibroids in the fundus. 2. No evidence of adnexal or ovarian mass. Electronically Signed   By: Marcello Moores  Register   On: 01/09/2014 10:58    Assessment & Plan:   Hannah Orr was seen today for cough.  Diagnoses and all orders for this visit:  Acute bronchitis, unspecified organism -     albuterol (PROVENTIL) (2.5 MG/3ML) 0.083% nebulizer solution 2.5 mg; Take 3 mLs (2.5 mg total) by nebulization once. -     DG Chest 2 View; Future -     benzonatate (TESSALON) 100 MG capsule; Take 1 capsule (100 mg total) by mouth 3 (three) times daily as needed for cough. -     guaiFENesin (MUCINEX) 600 MG 12 hr tablet; Take 1 tablet (600 mg total) by mouth 2 (two) times daily as needed for cough or to loosen phlegm. -     albuterol (PROVENTIL HFA;VENTOLIN HFA) 108 (90 Base) MCG/ACT inhaler; Inhale 2 puffs into the lungs every 6 (six) hours as needed for wheezing. -     cefdinir (OMNICEF) 300 MG capsule; Take 1 capsule (300 mg total) by mouth 2 (two) times daily.  Acute non-recurrent pansinusitis -     fluticasone (FLONASE) 50 MCG/ACT nasal spray; Place 2 sprays into both nostrils daily. -     oxymetazoline (AFRIN NASAL SPRAY) 0.05 % nasal spray; Place 1 spray into both nostrils 2 (two) times daily. Use only for 3days, then stop -     cefdinir (OMNICEF) 300 MG capsule; Take 1 capsule (300 mg total) by mouth 2 (two) times daily.  SOB (shortness of breath) on exertion -     DG Chest 2 View; Future -     cefdinir (OMNICEF) 300 MG capsule; Take 1 capsule (300 mg total) by mouth  2 (two) times daily.  Cough -     albuterol (PROVENTIL HFA;VENTOLIN HFA) 108 (90 Base) MCG/ACT inhaler; Inhale 2 puffs into the lungs every 6 (six) hours as needed for wheezing. -     cefdinir (OMNICEF) 300 MG capsule; Take 1 capsule (300 mg total) by mouth 2 (two) times daily.   I have discontinued Hannah Orr's predniSONE. I am also having her start on benzonatate, guaiFENesin, fluticasone, oxymetazoline, and cefdinir. Additionally, I am having her maintain her Multiple Vitamins-Calcium (ONE-A-DAY WOMENS PO), FLUoxetine, hydrochlorothiazide, Diclofenac Sodium, amLODipine, and albuterol. We administered albuterol. We will continue to administer methylPREDNISolone acetate.  Meds ordered this encounter  Medications  . albuterol (PROVENTIL) (2.5 MG/3ML) 0.083% nebulizer solution 2.5 mg  .  benzonatate (TESSALON) 100 MG capsule    Sig: Take 1 capsule (100 mg total) by mouth 3 (three) times daily as needed for cough.    Dispense:  20 capsule    Refill:  0    Order Specific Question:   Supervising Provider    Answer:   Cassandria Anger [[id number]]  . guaiFENesin (MUCINEX) 600 MG 12 hr tablet    Sig: Take 1 tablet (600 mg total) by mouth 2 (two) times daily as needed for cough or to loosen phlegm.    Dispense:  14 tablet    Refill:  0    Order Specific Question:   Supervising Provider    Answer:   Cassandria Anger [[id number]]  . fluticasone (FLONASE) 50 MCG/ACT nasal spray    Sig: Place 2 sprays into both nostrils daily.    Dispense:  16 g    Refill:  0    Order Specific Question:   Supervising Provider    Answer:   Cassandria Anger [[id number]]  . oxymetazoline (AFRIN NASAL SPRAY) 0.05 % nasal spray    Sig: Place 1 spray into both nostrils 2 (two) times daily. Use only for 3days, then stop    Dispense:  30 mL    Refill:  0    Order Specific Question:   Supervising Provider    Answer:   Cassandria Anger [[id number]]  . albuterol (PROVENTIL HFA;VENTOLIN HFA) 108 (90 Base) MCG/ACT inhaler     Sig: Inhale 2 puffs into the lungs every 6 (six) hours as needed for wheezing.    Dispense:  1 Inhaler    Refill:  0    Order Specific Question:   Supervising Provider    Answer:   Cassandria Anger [[id number]]  . cefdinir (OMNICEF) 300 MG capsule    Sig: Take 1 capsule (300 mg total) by mouth 2 (two) times daily.    Dispense:  14 capsule    Refill:  0    Order Specific Question:   Supervising Provider    Answer:   Cassandria Anger [[id number]]    Follow-up: Return if symptoms worsen or fail to improve.  Wilfred Lacy, NP

## 2016-02-09 NOTE — Progress Notes (Signed)
Pre visit review using our clinic review tool, if applicable. No additional management support is needed unless otherwise documented below in the visit note. 

## 2016-02-09 NOTE — Patient Instructions (Addendum)
CXR indicates bronchitis without any pneumonia.  URI Instructions: Flonase and Afrin use: apply 1spray of afrin in each nare, wait 50mins, then apply 2sprays of flonase in each nare. Use both nasal spray consecutively x 3days, then flonase only for at least 14days.  Encourage adequate oral hydration.  Use over-the-counter  "cold" medicines  such as "Tylenol cold" , "Advil cold",  "Mucinex" or" Mucinex D"  for cough and congestion.  Avoid decongestants if you have high blood pressure. Use" Delsym" or" Robitussin" cough syrup varietis for cough.  You can use plain "Tylenol" or "Advi"l for fever, chills and achyness.   "Common cold" symptoms are usually triggered by a virus.  The antibiotics are usually not necessary. On average, a" viral cold" illness would take 4-7 days to resolve. Please, make an appointment if you are not better or if you're worse.

## 2016-02-25 ENCOUNTER — Telehealth: Payer: Self-pay | Admitting: Nurse Practitioner

## 2016-02-25 DIAGNOSIS — J209 Acute bronchitis, unspecified: Secondary | ICD-10-CM

## 2016-02-25 DIAGNOSIS — R059 Cough, unspecified: Secondary | ICD-10-CM

## 2016-02-25 DIAGNOSIS — R05 Cough: Secondary | ICD-10-CM

## 2016-02-25 MED ORDER — ALBUTEROL SULFATE HFA 108 (90 BASE) MCG/ACT IN AERS: 2.0000 | INHALATION_SPRAY | Freq: Four times a day (QID) | RESPIRATORY_TRACT | 0 refills | Status: DC | PRN

## 2016-02-25 NOTE — Telephone Encounter (Signed)
Rx resend to [facility] at [address], cancel the one we sent into [facility]. Pt aware.

## 2016-02-25 NOTE — Telephone Encounter (Signed)
Pt aware rx send into [facility] [address]. Pt stated she never pick up the one we send in to [place] previously.

## 2016-02-25 NOTE — Telephone Encounter (Signed)
Pt called request for [place] to resend albuterol (PROVENTIL HFA;VENTOLIN HFA) 108 (90 Base) MCG/ACT inhaler   to [facility] [address] instead of [facility] (she never pick that one up). Please help

## 2016-03-13 ENCOUNTER — Other Ambulatory Visit: Payer: Self-pay | Admitting: Internal Medicine

## 2016-03-13 DIAGNOSIS — F32A Depression, unspecified: Secondary | ICD-10-CM

## 2016-03-13 DIAGNOSIS — F329 Major depressive disorder, single episode, unspecified: Secondary | ICD-10-CM

## 2016-04-19 ENCOUNTER — Telehealth: Payer: Self-pay | Admitting: Internal Medicine

## 2016-04-19 NOTE — Telephone Encounter (Signed)
Pt called asking to see if we have any sample of albuterol inhaler (this med is very expensive at pharmacy). Please call her back.

## 2016-04-20 NOTE — Telephone Encounter (Signed)
I don't have any samples of just albuterol  I do have Breo samples which I think would be better than just albuterol  Is she will to come in to start Breo?

## 2016-04-20 NOTE — Telephone Encounter (Signed)
LVM for pt to call back as soon as possible.   Re: changing rx to [facility].

## 2016-05-11 ENCOUNTER — Other Ambulatory Visit: Payer: Self-pay | Admitting: Family

## 2016-05-11 DIAGNOSIS — I10 Essential (primary) hypertension: Secondary | ICD-10-CM

## 2016-05-16 ENCOUNTER — Other Ambulatory Visit: Payer: Self-pay | Admitting: Family

## 2016-05-16 DIAGNOSIS — I10 Essential (primary) hypertension: Secondary | ICD-10-CM

## 2016-05-25 ENCOUNTER — Encounter: Payer: Self-pay | Admitting: Family Medicine

## 2016-05-25 ENCOUNTER — Telehealth: Payer: Self-pay | Admitting: Internal Medicine

## 2016-05-25 ENCOUNTER — Ambulatory Visit (INDEPENDENT_AMBULATORY_CARE_PROVIDER_SITE_OTHER): Payer: Managed Care, Other (non HMO) | Admitting: Family Medicine

## 2016-05-25 ENCOUNTER — Encounter: Payer: Self-pay | Admitting: *Deleted

## 2016-05-25 VITALS — BP 142/90 | HR 93 | Temp 98.4°F | Ht 65.0 in

## 2016-05-25 DIAGNOSIS — J989 Respiratory disorder, unspecified: Secondary | ICD-10-CM | POA: Diagnosis not present

## 2016-05-25 DIAGNOSIS — R030 Elevated blood-pressure reading, without diagnosis of hypertension: Secondary | ICD-10-CM

## 2016-05-25 DIAGNOSIS — I10 Essential (primary) hypertension: Secondary | ICD-10-CM

## 2016-05-25 LAB — POC INFLUENZA A&B (BINAX/QUICKVUE)
INFLUENZA A, POC: NEGATIVE
INFLUENZA B, POC: NEGATIVE

## 2016-05-25 MED ORDER — DOXYCYCLINE HYCLATE 100 MG PO TABS: 100.0000 mg | ORAL_TABLET | Freq: Two times a day (BID) | ORAL | 0 refills | Status: DC

## 2016-05-25 MED ORDER — BENZONATATE 100 MG PO CAPS: 100.0000 mg | ORAL_CAPSULE | Freq: Three times a day (TID) | ORAL | 0 refills | Status: DC | PRN

## 2016-05-25 NOTE — Patient Instructions (Signed)
Please take the doxycycline as advised.  Use the tessalon as needed.  I hope you are feeling better soon! Seek care immediately if worsening, new concerns or you are not improving with treatment.  Please schedule an appointment with your doctor in 2 weeks to recheck your blood pressure and follow up.

## 2016-05-25 NOTE — Progress Notes (Signed)
Pre visit review using our clinic review tool, if applicable. No additional management support is needed unless otherwise documented below in the visit note. Patient declines weight measurement today. 

## 2016-05-25 NOTE — Telephone Encounter (Signed)
yes

## 2016-05-25 NOTE — Telephone Encounter (Signed)
Patient would like to transfer from Dr. Scarlette Calico to Almira Coaster, [place]. Is this ok?

## 2016-05-25 NOTE — Progress Notes (Signed)
HPI:  Cough and congestion: -started: reports started about 2-3 weeks ago -symptoms:nasal congestion, cough -acutely worse today with chills, sweats, malaise, worsening productive cough and body aches -denies hx of asthma but reports given an inhaler last month when sick -denies:SOB, NVD, tooth pain -has tried: albuterol for the cough -sick contacts/travel/risks: no reported flu, strep or tick exposure -Hx of: allergies - take flonase  ROS: See pertinent positives and negatives per HPI.  Past Medical History:  Diagnosis Date  . Diabetes mellitus   . Hypertension     No past surgical history on file.  Family History  Problem Relation Age of Onset  . Hypertension Mother   . Hypertension Father   . Cancer Neg Hx   . Heart disease Neg Hx   . Hyperlipidemia Neg Hx   . Kidney disease Neg Hx   . Diabetes Neg Hx   . Early death Neg Hx   . Hearing loss Neg Hx   . Stroke Neg Hx     Social History   Social History  . Marital status: Single    Spouse name: N/A  . Number of children: N/A  . Years of education: N/A   Social History Main Topics  . Smoking status: Never Smoker  . Smokeless tobacco: Never Used  . Alcohol use No  . Drug use: No  . Sexual activity: Yes    Birth control/ protection: Condom   Other Topics Concern  . None   Social History Narrative  . None     Current Outpatient Prescriptions:  .  albuterol (PROVENTIL HFA;VENTOLIN HFA) 108 (90 Base) MCG/ACT inhaler, Inhale 2 puffs into the lungs every 6 (six) hours as needed for wheezing., Disp: 1 Inhaler, Rfl: 0 .  amLODipine (NORVASC) 10 MG tablet, TAKE 1 TABLET BY MOUTH DAILY, Disp: 30 tablet, Rfl: 4 .  cefdinir (OMNICEF) 300 MG capsule, Take 1 capsule (300 mg total) by mouth 2 (two) times daily., Disp: 14 capsule, Rfl: 0 .  Diclofenac Sodium (PENNSAID) 2 % SOLN, Place 1 application onto the skin 2 times daily at 12 noon and 4 pm., Disp: 2 g, Rfl: 0 .  FLUoxetine (PROZAC) 20 MG capsule, TAKE ONE  CAPSULE BY MOUTH DAILY, Disp: 30 capsule, Rfl: 5 .  fluticasone (FLONASE) 50 MCG/ACT nasal spray, Place 2 sprays into both nostrils daily., Disp: 16 g, Rfl: 0 .  guaiFENesin (MUCINEX) 600 MG 12 hr tablet, Take 1 tablet (600 mg total) by mouth 2 (two) times daily as needed for cough or to loosen phlegm., Disp: 14 tablet, Rfl: 0 .  hydrochlorothiazide (HYDRODIURIL) 25 MG tablet, TAKE ONE TABLET BY MOUTH ONCE DAILY, Disp: 90 tablet, Rfl: 1 .  Multiple Vitamins-Calcium (ONE-A-DAY WOMENS PO), Take 1 each by mouth daily.  , Disp: , Rfl:  .  oxymetazoline (AFRIN NASAL SPRAY) 0.05 % nasal spray, Place 1 spray into both nostrils 2 (two) times daily. Use only for 3days, then stop, Disp: 30 mL, Rfl: 0 .  benzonatate (TESSALON PERLES) 100 MG capsule, Take 1 capsule (100 mg total) by mouth 3 (three) times daily as needed for cough., Disp: 20 capsule, Rfl: 0 .  doxycycline (VIBRA-TABS) 100 MG tablet, Take 1 tablet (100 mg total) by mouth 2 (two) times daily., Disp: 20 tablet, Rfl: 0  Current Facility-Administered Medications:  .  methylPREDNISolone acetate (DEPO-MEDROL) injection 80 mg, 80 mg, Intramuscular, Once, Flossie Buffy, NP  EXAM:  Vitals:   05/25/16 1442  BP: (!) 142/90  Pulse: 93  Temp: 98.4 F (36.9 C)    There is no height or weight on file to calculate BMI.  GENERAL: vitals reviewed and listed above, alert, oriented, appears well hydrated and in no acute distress  HEENT: atraumatic, conjunttiva clear, no obvious abnormalities on inspection of external nose and ears, normal appearance of ear canals and TMs, clear nasal congestion, mild post oropharyngeal erythema with PND, no tonsillar edema or exudate, no sinus TTP  NECK: no obvious masses on inspection  LUNGS: clear to auscultation bilaterally, no wheezes, rales or rhonchi, good air movement  CV: HRRR, no peripheral edema  MS: moves all extremities without noticeable abnormality  PSYCH: pleasant and cooperative, no obvious  depression or anxiety  ASSESSMENT AND PLAN:  Discussed the following assessment and plan:  Respiratory illness: -query allergies or vuri now worsening with either developing bacterial infection or flu -rapid flu test negative - she opted against tamiflu -doxy and tessalon  -she declined CXR; advised prompt re-eval if worsening or not improving  Elevated blood pressure reading Essential hypertension, benign -follow up with PCP in 2 weeks to recheck when feeling better   Patient Instructions  Please take the doxycycline as advised.  Use the tessalon as needed.  I hope you are feeling better soon! Seek care immediately if worsening, new concerns or you are not improving with treatment.  Please schedule an appointment with your doctor in 2 weeks to recheck your blood pressure and follow up.   Colin Benton R., DO

## 2016-07-06 ENCOUNTER — Other Ambulatory Visit: Payer: Self-pay | Admitting: Internal Medicine

## 2016-07-07 ENCOUNTER — Telehealth: Payer: Self-pay | Admitting: General Practice

## 2016-07-07 MED ORDER — AMLODIPINE BESYLATE 10 MG PO TABS
10.0000 mg | ORAL_TABLET | Freq: Every day | ORAL | 0 refills | Status: DC
Start: 2016-07-07 — End: 2020-05-14

## 2016-07-07 NOTE — Telephone Encounter (Signed)
Pt has made establish appt with julia kordsmeier on 07/18/16. However, pt has not seen anyone and Terri Piedra filled her last rx. Could he refill a 30 day of amLODipine (NORVASC) 10 MG tablet  To get her through to this appt?  Pt states she is completely out of the bp med and hopes to get today.  Kristopher Oppenheim Friendly [address] [place]  [facility] only works 2 days a week right now)  Thank you very much!

## 2016-07-07 NOTE — Addendum Note (Signed)
Addended by: Delice Bison E on: 07/07/2016 02:29 PM   Modules accepted: Orders

## 2016-07-07 NOTE — Telephone Encounter (Signed)
Med has been sent

## 2016-07-12 ENCOUNTER — Encounter: Payer: Self-pay | Admitting: Gynecology

## 2016-07-14 ENCOUNTER — Telehealth: Payer: Self-pay | Admitting: *Deleted

## 2016-07-14 NOTE — Telephone Encounter (Signed)
Pt left msg on triage stating she will be transferring over to [place] office due to being close to her. Her appt is not until 5/21, but she is needing refill on her fluoxetine. Requesting if she can have refill until she see Dr. Donia Ast @ [facility]...John[place]

## 2016-07-14 NOTE — Telephone Encounter (Signed)
Should still have refills at pharmacy. 6 month supply sent in January.

## 2016-07-14 NOTE — Telephone Encounter (Signed)
Notified pt w/MD response.../lmb 

## 2016-07-17 ENCOUNTER — Ambulatory Visit: Payer: Managed Care, Other (non HMO) | Admitting: Family Medicine

## 2016-08-09 DIAGNOSIS — F064 Anxiety disorder due to known physiological condition: Secondary | ICD-10-CM | POA: Diagnosis not present

## 2016-08-09 DIAGNOSIS — F338 Other recurrent depressive disorders: Secondary | ICD-10-CM | POA: Diagnosis not present

## 2016-08-09 DIAGNOSIS — I10 Essential (primary) hypertension: Secondary | ICD-10-CM | POA: Diagnosis not present

## 2016-08-09 DIAGNOSIS — F1320 Sedative, hypnotic or anxiolytic dependence, uncomplicated: Secondary | ICD-10-CM | POA: Diagnosis not present

## 2016-08-09 DIAGNOSIS — F4320 Adjustment disorder, unspecified: Secondary | ICD-10-CM | POA: Diagnosis not present

## 2016-08-14 ENCOUNTER — Other Ambulatory Visit: Payer: Self-pay | Admitting: Family

## 2016-08-14 NOTE — Telephone Encounter (Signed)
I have not seen patient and she has not upcoming appointment. She will need to either follow up with Marya Amsler or set up an appointment to establish care

## 2016-08-14 NOTE — Telephone Encounter (Signed)
Last note that I have pt transferred care to you but I do not see where she has seen you yet.

## 2016-08-15 NOTE — Telephone Encounter (Signed)
Thank you Gregary Signs, I have sent her a message and will follow up with her regarding same.

## 2016-08-21 DIAGNOSIS — I10 Essential (primary) hypertension: Secondary | ICD-10-CM | POA: Diagnosis not present

## 2016-08-21 DIAGNOSIS — F338 Other recurrent depressive disorders: Secondary | ICD-10-CM | POA: Diagnosis not present

## 2016-08-21 DIAGNOSIS — Z1231 Encounter for screening mammogram for malignant neoplasm of breast: Secondary | ICD-10-CM | POA: Diagnosis not present

## 2016-08-21 DIAGNOSIS — E669 Obesity, unspecified: Secondary | ICD-10-CM | POA: Diagnosis not present

## 2016-08-21 DIAGNOSIS — F064 Anxiety disorder due to known physiological condition: Secondary | ICD-10-CM | POA: Diagnosis not present

## 2016-08-21 DIAGNOSIS — F4320 Adjustment disorder, unspecified: Secondary | ICD-10-CM | POA: Diagnosis not present

## 2017-06-01 ENCOUNTER — Other Ambulatory Visit: Payer: Self-pay | Admitting: Internal Medicine

## 2017-06-01 DIAGNOSIS — I10 Essential (primary) hypertension: Secondary | ICD-10-CM

## 2017-08-02 MED FILL — AMLODIPINE BESYLATE 10 MG T: 10 | 30 days supply | Qty: 30 | Fill #0 | Status: TO

## 2017-10-24 DIAGNOSIS — I10 Essential (primary) hypertension: Secondary | ICD-10-CM | POA: Diagnosis not present

## 2017-10-24 DIAGNOSIS — Z6841 Body Mass Index (BMI) 40.0 and over, adult: Secondary | ICD-10-CM | POA: Diagnosis not present

## 2017-10-24 DIAGNOSIS — F064 Anxiety disorder due to known physiological condition: Secondary | ICD-10-CM | POA: Diagnosis not present

## 2018-01-16 DIAGNOSIS — I10 Essential (primary) hypertension: Secondary | ICD-10-CM | POA: Diagnosis not present

## 2018-01-16 DIAGNOSIS — F338 Other recurrent depressive disorders: Secondary | ICD-10-CM | POA: Diagnosis not present

## 2018-01-16 DIAGNOSIS — F064 Anxiety disorder due to known physiological condition: Secondary | ICD-10-CM | POA: Diagnosis not present

## 2018-03-13 DIAGNOSIS — E669 Obesity, unspecified: Secondary | ICD-10-CM | POA: Diagnosis not present

## 2018-03-13 DIAGNOSIS — F4320 Adjustment disorder, unspecified: Secondary | ICD-10-CM | POA: Diagnosis not present

## 2018-03-13 DIAGNOSIS — I10 Essential (primary) hypertension: Secondary | ICD-10-CM | POA: Diagnosis not present

## 2018-03-13 DIAGNOSIS — F338 Other recurrent depressive disorders: Secondary | ICD-10-CM | POA: Diagnosis not present

## 2018-03-14 DIAGNOSIS — F4320 Adjustment disorder, unspecified: Secondary | ICD-10-CM | POA: Diagnosis not present

## 2018-03-14 DIAGNOSIS — I10 Essential (primary) hypertension: Secondary | ICD-10-CM | POA: Diagnosis not present

## 2018-03-14 DIAGNOSIS — F338 Other recurrent depressive disorders: Secondary | ICD-10-CM | POA: Diagnosis not present

## 2018-03-14 DIAGNOSIS — K700 Alcoholic fatty liver: Secondary | ICD-10-CM | POA: Diagnosis not present

## 2018-06-13 DIAGNOSIS — N390 Urinary tract infection, site not specified: Secondary | ICD-10-CM | POA: Diagnosis not present

## 2018-06-13 DIAGNOSIS — I10 Essential (primary) hypertension: Secondary | ICD-10-CM | POA: Diagnosis not present

## 2018-06-13 DIAGNOSIS — F1320 Sedative, hypnotic or anxiolytic dependence, uncomplicated: Secondary | ICD-10-CM | POA: Diagnosis not present

## 2018-06-13 DIAGNOSIS — F064 Anxiety disorder due to known physiological condition: Secondary | ICD-10-CM | POA: Diagnosis not present

## 2018-06-13 DIAGNOSIS — E6609 Other obesity due to excess calories: Secondary | ICD-10-CM | POA: Diagnosis not present

## 2018-06-13 DIAGNOSIS — F338 Other recurrent depressive disorders: Secondary | ICD-10-CM | POA: Diagnosis not present

## 2018-07-31 DIAGNOSIS — R102 Pelvic and perineal pain: Secondary | ICD-10-CM | POA: Insufficient documentation

## 2018-07-31 DIAGNOSIS — F411 Generalized anxiety disorder: Secondary | ICD-10-CM | POA: Diagnosis not present

## 2018-07-31 DIAGNOSIS — I10 Essential (primary) hypertension: Secondary | ICD-10-CM | POA: Diagnosis not present

## 2018-08-12 DIAGNOSIS — Z6841 Body Mass Index (BMI) 40.0 and over, adult: Secondary | ICD-10-CM | POA: Diagnosis not present

## 2018-08-12 DIAGNOSIS — Z1151 Encounter for screening for human papillomavirus (HPV): Secondary | ICD-10-CM | POA: Diagnosis not present

## 2018-08-12 DIAGNOSIS — N920 Excessive and frequent menstruation with regular cycle: Secondary | ICD-10-CM | POA: Diagnosis not present

## 2018-08-12 DIAGNOSIS — Z01419 Encounter for gynecological examination (general) (routine) without abnormal findings: Secondary | ICD-10-CM | POA: Diagnosis not present

## 2018-08-12 DIAGNOSIS — Z113 Encounter for screening for infections with a predominantly sexual mode of transmission: Secondary | ICD-10-CM | POA: Diagnosis not present

## 2018-08-12 DIAGNOSIS — Z124 Encounter for screening for malignant neoplasm of cervix: Secondary | ICD-10-CM | POA: Diagnosis not present

## 2018-08-12 DIAGNOSIS — N946 Dysmenorrhea, unspecified: Secondary | ICD-10-CM | POA: Diagnosis not present

## 2018-08-13 DIAGNOSIS — Z1231 Encounter for screening mammogram for malignant neoplasm of breast: Secondary | ICD-10-CM | POA: Diagnosis not present

## 2018-08-13 DIAGNOSIS — R109 Unspecified abdominal pain: Secondary | ICD-10-CM | POA: Diagnosis not present

## 2018-08-13 DIAGNOSIS — D259 Leiomyoma of uterus, unspecified: Secondary | ICD-10-CM | POA: Diagnosis not present

## 2018-08-13 DIAGNOSIS — Z0000 Encounter for general adult medical examination without abnormal findings: Secondary | ICD-10-CM | POA: Diagnosis not present

## 2018-08-19 ENCOUNTER — Other Ambulatory Visit: Payer: Self-pay | Admitting: Obstetrics and Gynecology

## 2018-08-19 DIAGNOSIS — D219 Benign neoplasm of connective and other soft tissue, unspecified: Secondary | ICD-10-CM

## 2018-08-29 DIAGNOSIS — D72819 Decreased white blood cell count, unspecified: Secondary | ICD-10-CM | POA: Diagnosis not present

## 2018-08-29 DIAGNOSIS — R748 Abnormal levels of other serum enzymes: Secondary | ICD-10-CM | POA: Diagnosis not present

## 2018-09-04 DIAGNOSIS — F411 Generalized anxiety disorder: Secondary | ICD-10-CM | POA: Diagnosis not present

## 2018-09-04 DIAGNOSIS — R102 Pelvic and perineal pain: Secondary | ICD-10-CM | POA: Diagnosis not present

## 2018-09-04 DIAGNOSIS — I10 Essential (primary) hypertension: Secondary | ICD-10-CM | POA: Diagnosis not present

## 2018-09-10 ENCOUNTER — Other Ambulatory Visit: Payer: Self-pay | Admitting: Obstetrics and Gynecology

## 2018-09-11 ENCOUNTER — Ambulatory Visit
Admission: RE | Admit: 2018-09-11 | Discharge: 2018-09-11 | Disposition: A | Discharge status: Home / Self Care | Payer: BC Managed Care – PPO | Source: Ambulatory Visit | Attending: Obstetrics and Gynecology | Admitting: Obstetrics and Gynecology

## 2018-09-11 ENCOUNTER — Other Ambulatory Visit: Payer: Self-pay

## 2018-09-11 ENCOUNTER — Other Ambulatory Visit: Payer: Managed Care, Other (non HMO)

## 2018-09-11 DIAGNOSIS — D219 Benign neoplasm of connective and other soft tissue, unspecified: Secondary | ICD-10-CM

## 2018-09-11 DIAGNOSIS — D259 Leiomyoma of uterus, unspecified: Secondary | ICD-10-CM | POA: Diagnosis not present

## 2018-09-11 DIAGNOSIS — Z8742 Personal history of other diseases of the female genital tract: Secondary | ICD-10-CM | POA: Insufficient documentation

## 2018-09-11 MED ORDER — GADOBENATE DIMEGLUMINE 529 MG/ML IV SOLN
20.0000 mL | Freq: Once | INTRAVENOUS | Status: AC | PRN
  Administered 2018-09-11: 20 mL via INTRAVENOUS

## 2018-09-24 DIAGNOSIS — R600 Localized edema: Secondary | ICD-10-CM | POA: Diagnosis not present

## 2018-09-24 DIAGNOSIS — R609 Edema, unspecified: Secondary | ICD-10-CM | POA: Diagnosis not present

## 2018-09-24 DIAGNOSIS — Z23 Encounter for immunization: Secondary | ICD-10-CM | POA: Diagnosis not present

## 2018-09-24 DIAGNOSIS — I10 Essential (primary) hypertension: Secondary | ICD-10-CM | POA: Diagnosis not present

## 2018-09-25 DIAGNOSIS — K760 Fatty (change of) liver, not elsewhere classified: Secondary | ICD-10-CM | POA: Insufficient documentation

## 2019-06-17 DIAGNOSIS — I10 Essential (primary) hypertension: Secondary | ICD-10-CM | POA: Diagnosis not present

## 2019-06-17 DIAGNOSIS — K760 Fatty (change of) liver, not elsewhere classified: Secondary | ICD-10-CM | POA: Diagnosis not present

## 2019-06-17 DIAGNOSIS — Z0000 Encounter for general adult medical examination without abnormal findings: Secondary | ICD-10-CM | POA: Diagnosis not present

## 2019-06-17 DIAGNOSIS — M79606 Pain in leg, unspecified: Secondary | ICD-10-CM | POA: Insufficient documentation

## 2019-06-17 DIAGNOSIS — R102 Pelvic and perineal pain: Secondary | ICD-10-CM | POA: Diagnosis not present

## 2019-06-17 DIAGNOSIS — M25569 Pain in unspecified knee: Secondary | ICD-10-CM | POA: Insufficient documentation

## 2019-06-17 DIAGNOSIS — D72819 Decreased white blood cell count, unspecified: Secondary | ICD-10-CM | POA: Diagnosis not present

## 2019-09-22 DIAGNOSIS — R7989 Other specified abnormal findings of blood chemistry: Secondary | ICD-10-CM | POA: Diagnosis not present

## 2019-09-22 DIAGNOSIS — K760 Fatty (change of) liver, not elsewhere classified: Secondary | ICD-10-CM | POA: Diagnosis not present

## 2019-09-22 DIAGNOSIS — R718 Other abnormality of red blood cells: Secondary | ICD-10-CM | POA: Diagnosis not present

## 2019-09-22 DIAGNOSIS — R609 Edema, unspecified: Secondary | ICD-10-CM | POA: Diagnosis not present

## 2019-09-22 DIAGNOSIS — D7589 Other specified diseases of blood and blood-forming organs: Secondary | ICD-10-CM | POA: Insufficient documentation

## 2019-09-22 DIAGNOSIS — I10 Essential (primary) hypertension: Secondary | ICD-10-CM | POA: Diagnosis not present

## 2020-04-01 ENCOUNTER — Ambulatory Visit: Payer: Self-pay

## 2020-04-27 ENCOUNTER — Other Ambulatory Visit: Payer: Self-pay

## 2020-04-27 ENCOUNTER — Emergency Department (HOSPITAL_BASED_OUTPATIENT_CLINIC_OR_DEPARTMENT_OTHER): Payer: BC Managed Care – PPO

## 2020-04-27 ENCOUNTER — Emergency Department (HOSPITAL_BASED_OUTPATIENT_CLINIC_OR_DEPARTMENT_OTHER)
Admission: EM | Admit: 2020-04-27 | Discharge: 2020-04-27 | Disposition: A | Discharge status: Home / Self Care | Payer: BC Managed Care – PPO | Attending: Emergency Medicine | Admitting: Emergency Medicine

## 2020-04-27 ENCOUNTER — Encounter (HOSPITAL_BASED_OUTPATIENT_CLINIC_OR_DEPARTMENT_OTHER): Payer: Self-pay | Admitting: Emergency Medicine

## 2020-04-27 DIAGNOSIS — I10 Essential (primary) hypertension: Secondary | ICD-10-CM | POA: Diagnosis not present

## 2020-04-27 DIAGNOSIS — E119 Type 2 diabetes mellitus without complications: Secondary | ICD-10-CM | POA: Insufficient documentation

## 2020-04-27 DIAGNOSIS — R2243 Localized swelling, mass and lump, lower limb, bilateral: Secondary | ICD-10-CM | POA: Diagnosis present

## 2020-04-27 DIAGNOSIS — R7989 Other specified abnormal findings of blood chemistry: Secondary | ICD-10-CM

## 2020-04-27 DIAGNOSIS — R609 Edema, unspecified: Secondary | ICD-10-CM | POA: Diagnosis not present

## 2020-04-27 DIAGNOSIS — G6289 Other specified polyneuropathies: Secondary | ICD-10-CM

## 2020-04-27 LAB — CBC WITH DIFFERENTIAL/PLATELET
Abs Immature Granulocytes: 0.01 10*3/uL (ref 0.00–0.07)
Basophils Absolute: 0 10*3/uL (ref 0.0–0.1)
Basophils Relative: 0 %
Eosinophils Absolute: 0.1 10*3/uL (ref 0.0–0.5)
Eosinophils Relative: 4 %
HCT: 36 % (ref 36.0–46.0)
Hemoglobin: 12 g/dL (ref 12.0–15.0)
Immature Granulocytes: 0 %
Lymphocytes Relative: 32 %
Lymphs Abs: 0.7 10*3/uL (ref 0.7–4.0)
MCH: 34.9 pg — ABNORMAL HIGH (ref 26.0–34.0)
MCHC: 33.3 g/dL (ref 30.0–36.0)
MCV: 104.7 fL — ABNORMAL HIGH (ref 80.0–100.0)
Monocytes Absolute: 0.4 10*3/uL (ref 0.1–1.0)
Monocytes Relative: 18 %
Neutro Abs: 1 10*3/uL — ABNORMAL LOW (ref 1.7–7.7)
Neutrophils Relative %: 46 %
Platelets: 255 10*3/uL (ref 150–400)
RBC: 3.44 MIL/uL — ABNORMAL LOW (ref 3.87–5.11)
RDW: 13.9 % (ref 11.5–15.5)
WBC: 2.3 10*3/uL — ABNORMAL LOW (ref 4.0–10.5)
nRBC: 0 % (ref 0.0–0.2)

## 2020-04-27 LAB — COMPREHENSIVE METABOLIC PANEL
ALT: 125 U/L — ABNORMAL HIGH (ref 0–44)
AST: 316 U/L — ABNORMAL HIGH (ref 15–41)
Albumin: 3.7 g/dL (ref 3.5–5.0)
Alkaline Phosphatase: 94 U/L (ref 38–126)
Anion gap: 14 (ref 5–15)
BUN: 14 mg/dL (ref 6–20)
CO2: 21 mmol/L — ABNORMAL LOW (ref 22–32)
Calcium: 8.8 mg/dL — ABNORMAL LOW (ref 8.9–10.3)
Chloride: 99 mmol/L (ref 98–111)
Creatinine, Ser: 0.77 mg/dL (ref 0.44–1.00)
GFR, Estimated: >60 mL/min (ref >60)
Glucose, Bld: 110 mg/dL — ABNORMAL HIGH (ref 70–99)
Potassium: 3.5 mmol/L (ref 3.5–5.1)
Sodium: 134 mmol/L — ABNORMAL LOW (ref 135–145)
Total Bilirubin: 0.5 mg/dL (ref 0.3–1.2)
Total Protein: 8.9 g/dL — ABNORMAL HIGH (ref 6.5–8.1)

## 2020-04-27 LAB — PREGNANCY, URINE: Preg Test, Ur: NEGATIVE

## 2020-04-27 LAB — BRAIN NATRIURETIC PEPTIDE: B Natriuretic Peptide: 39.5 pg/mL (ref 0.0–100.0)

## 2020-04-27 MED ORDER — GABAPENTIN 100 MG PO CAPS: 100.0000 mg | ORAL_CAPSULE | Freq: Three times a day (TID) | ORAL | 0 refills | Status: DC

## 2020-04-27 NOTE — ED Triage Notes (Signed)
Pt c/o bilat leg swelling x a few weeks. Denies this happening to her before. Pt states that the swelling. C/o the swelling is from knee down bilat. States that the pain is better when she props her legs up but that they are still swelling. Pt aaox3, ambulatory with steady gait, VSS, GCS 15, NAD noted.

## 2020-04-27 NOTE — ED Provider Notes (Signed)
Emergency Department Provider Note   I have reviewed the triage vital signs and the nursing notes.   HISTORY  Chief Complaint Leg Swelling   HPI Hannah Orr is a 45 y.o. female with past medical history reviewed below presents to the emergency department with continued swelling in the legs along with neuropathy type pain and numbness on the bottom of the feet.  Patient was seen at [facility] by her PCP on 2/24.  She was started on Lasix 20 mg daily which she has been taking but states her swelling continues.  Is not feeling short of breath.  She denies any PND or orthopnea symptoms.  She has developed a burning pain in her legs and numbness on the bottom of the feet.  She sleeps in a recliner which is unchanged for her.  She does not feel like she is sleeping propped up further.  She is able to lie flat without significant issue.  The patient's sister-in-law is at bedside and states that she does elevate her legs but not above the level of her heart. No radiation of symptoms or other modifying factors.   Past Medical History:  Diagnosis Date  . Diabetes mellitus   . Hypertension     Patient Active Problem List   Diagnosis Date Noted  . Cervical paraspinal muscle spasm 10/08/2015  . Intramural leiomyoma of uterus 01/12/2014  . Pelvic mass in female 12/28/2013  . Nonspecific elevation of levels of transaminase or lactic acid dehydrogenase (LDH) 11/24/2013  . GERD (gastroesophageal reflux disease) 11/24/2013  . Other abnormal glucose 08/08/2013  . Severe obesity (BMI >= 40) ([place]) 08/08/2013  . Flow murmur 10/21/2012  . Routine general medical examination at a health care facility 10/17/2012  . Depression, acute 04/25/2011  . Essential hypertension, benign 12/26/2010    History reviewed. No pertinent surgical history.  Allergies Benazepril hcl  Family History  Problem Relation Age of Onset  . Hypertension Mother   . Hypertension Father   . Cancer Neg Hx   . Heart  disease Neg Hx   . Hyperlipidemia Neg Hx   . Kidney disease Neg Hx   . Diabetes Neg Hx   . Early death Neg Hx   . Hearing loss Neg Hx   . Stroke Neg Hx     Social History Social History   Tobacco Use  . Smoking status: Never Smoker  . Smokeless tobacco: Never Used  Substance Use Topics  . Alcohol use: No  . Drug use: No    Review of Systems  Constitutional: No fever/chills Eyes: No visual changes. ENT: No sore throat. Cardiovascular: Denies chest pain. Positive continued leg swelling.  Respiratory: Denies shortness of breath. Gastrointestinal: No abdominal pain.  No nausea, no vomiting.  No diarrhea.  No constipation. Genitourinary: Negative for dysuria. Musculoskeletal: Negative for back pain. Skin: Negative for rash. Neurological: Negative for headaches, focal weakness. Burning pain to the bilateral legs. Positive numbness to the bottom of both feet.   10-point ROS otherwise negative.  ____________________________________________   PHYSICAL EXAM:  VITAL SIGNS: ED Triage Vitals  Enc Vitals Group     BP 04/27/20 1909 (!) 164/88     Pulse Rate 04/27/20 1909 82     Resp 04/27/20 1909 20     Temp 04/27/20 1909 97.8 F (36.6 C)     Temp Source 04/27/20 1909 Oral     SpO2 04/27/20 1909 94 %     Weight 04/27/20 1909 (!) 345 lb (156.5 kg)  Height 04/27/20 1909 5\' 5"  (1.651 m)   Constitutional: Alert and oriented. Well appearing and in no acute distress. Eyes: Conjunctivae are normal.  Head: Atraumatic. Nose: No congestion/rhinnorhea. Mouth/Throat: Mucous membranes are moist.   Neck: No stridor.   Cardiovascular: Normal rate, regular rhythm. Good peripheral circulation. Grossly normal heart sounds.   Respiratory: Normal respiratory effort.  No retractions. Lungs CTAB. Gastrointestinal: Soft with mild diffuse tenderness. No focal tenderness specifically in the RUQ or Murphy's sign. No distention.  Musculoskeletal: No lower extremity tenderness with pitting  edema to the bilateral LEs w/o cellulitis or venous stasis dermatitis. No gross deformities of extremities. Neurologic:  Normal speech and language. No gross focal neurologic deficits are appreciated.  Skin:  Skin is warm, dry and intact. No rash noted.  ____________________________________________   LABS (all labs ordered are listed, but only abnormal results are displayed)  Labs Reviewed  COMPREHENSIVE METABOLIC PANEL - Abnormal; Notable for the following components:      Result Value   Sodium 134 (*)    CO2 21 (*)    Glucose, Bld 110 (*)    Calcium 8.8 (*)    Total Protein 8.9 (*)    AST 316 (*)    ALT 125 (*)    All other components within normal limits  CBC WITH DIFFERENTIAL/PLATELET - Abnormal; Notable for the following components:   WBC 2.3 (*)    RBC 3.44 (*)    MCV 104.7 (*)    MCH 34.9 (*)    Neutro Abs 1.0 (*)    All other components within normal limits  BRAIN NATRIURETIC PEPTIDE  PREGNANCY, URINE   ____________________________________________  RADIOLOGY  DG Chest Portable 1 View  Result Date: 04/27/2020 CLINICAL DATA:  Bilateral lower extremity edema for several weeks EXAM: PORTABLE CHEST 1 VIEW COMPARISON:  02/09/2016 FINDINGS: Frontal view of the chest was obtained, limited by portable technique and body habitus. Cardiac silhouette is enlarged. No airspace disease, effusion, or pneumothorax. No acute bony abnormalities. IMPRESSION: 1. Enlarged cardiac silhouette. 2. No acute airspace disease. Electronically Signed   By: Randa Ngo M.D.   On: 04/27/2020 20:36   US Abdomen Limited RUQ (LIVER/GB)  Result Date: 04/27/2020 CLINICAL DATA:  Elevated LFTs EXAM: ULTRASOUND ABDOMEN LIMITED RIGHT UPPER QUADRANT COMPARISON:  None. FINDINGS: Gallbladder: No gallstones or wall thickening visualized. No sonographic Murphy sign noted by sonographer. Common bile duct: Diameter: 3.8 mm Liver: Mild hepatomegaly with increased echogenicity seen throughout. No focal hepatic lesion  is seen prior portal vein is patent on color Doppler imaging with normal direction of blood flow towards the liver. Other: None. IMPRESSION: Normal appearing gallbladder. Hepatic steatosis and mild hepatomegaly Electronically Signed   By: Prudencio Pair M.D.   On: 04/27/2020 22:07    ____________________________________________   PROCEDURES  Procedure(s) performed:   Procedures  None  ____________________________________________   INITIAL IMPRESSION / ASSESSMENT AND PLAN / ED COURSE  Pertinent labs & imaging results that were available during my care of the patient were reviewed by me and considered in my medical decision making (see chart for details).   Patient presents to the emergency department for evaluation of continued lower extremity swelling.  No shortness of breath either at rest or exertion.  No PND or orthopnea.  Vital signs here show hypertension but no hypoxemia or tachypnea.  I am able to review the blood work from 2/24 in [place] showing normal kidney function at that time with mild LFT elevation.  Normal bilirubin.  The PCP  note from 2/24 describes chronic edema in the lower extremities secondary to body habitus and venous stasis.  Amlodipine dosing was changed and added 20 mg Lasix.  Will perform chest x-ray to evaluate for underlying pulmonary edema and follow her blood work including kidney function.  She may benefit from increased dose of Lasix temporarily.  She does see her PCP again on Friday. I do not appreciate any ulcers or other skin breakdown.   Kidney function is unchanged.  The patient does have some continued elevated LFTs.  They are increased from prior but bilirubin is normal.  Followed this with right upper quadrant ultrasound which showed no acute findings today.   BNP is normal.  Patient has follow-up with her PCP on Friday.  Will defer further testing until then.  Plan to double Lasix and will start her on low-dose gabapentin for neuropathy type  pain. Discussed ED return precautions.  ____________________________________________  FINAL CLINICAL IMPRESSION(S) / ED DIAGNOSES  Final diagnoses:  Elevated LFTs  Peripheral edema  Other polyneuropathy     Note:  This document was prepared using Dragon voice recognition software and may include unintentional dictation errors.  Nanda Quinton, MD, [facility]    Raylinn Kosar, [facility], MD 04/27/20 2352

## 2020-04-27 NOTE — Discharge Instructions (Signed)
You were seen in the emergency department today with continued swelling in the legs.  I have called in a medication to the pharmacy to help with your leg pain.  Your liver enzymes continue to move upward but only slightly.  Your ultrasound of the liver was largely unremarkable.  You can double your dose of Lasix for the next several days until you see your primary care doctor on Friday.   If you develop any new or suddenly worsening symptoms please return to the emergency department for reevaluation.

## 2020-04-27 NOTE — ED Notes (Signed)
ED Provider at bedside. 

## 2020-05-12 ENCOUNTER — Encounter (HOSPITAL_COMMUNITY): Payer: Self-pay

## 2020-05-12 ENCOUNTER — Inpatient Hospital Stay (HOSPITAL_COMMUNITY)
Admission: EM | Admit: 2020-05-12 | Discharge: 2020-05-14 | DRG: 305 | Disposition: A | Discharge status: Home / Self Care | Payer: BC Managed Care – PPO | Attending: Student | Admitting: Student

## 2020-05-12 ENCOUNTER — Other Ambulatory Visit: Payer: Self-pay

## 2020-05-12 ENCOUNTER — Emergency Department (HOSPITAL_COMMUNITY): Payer: BC Managed Care – PPO

## 2020-05-12 DIAGNOSIS — I160 Hypertensive urgency: Secondary | ICD-10-CM | POA: Diagnosis not present

## 2020-05-12 DIAGNOSIS — R0789 Other chest pain: Secondary | ICD-10-CM

## 2020-05-12 DIAGNOSIS — F419 Anxiety disorder, unspecified: Secondary | ICD-10-CM | POA: Diagnosis present

## 2020-05-12 DIAGNOSIS — Z888 Allergy status to other drugs, medicaments and biological substances status: Secondary | ICD-10-CM

## 2020-05-12 DIAGNOSIS — R0602 Shortness of breath: Secondary | ICD-10-CM

## 2020-05-12 DIAGNOSIS — Z20822 Contact with and (suspected) exposure to covid-19: Secondary | ICD-10-CM | POA: Diagnosis present

## 2020-05-12 DIAGNOSIS — Z6841 Body Mass Index (BMI) 40.0 and over, adult: Secondary | ICD-10-CM

## 2020-05-12 DIAGNOSIS — D72819 Decreased white blood cell count, unspecified: Secondary | ICD-10-CM

## 2020-05-12 DIAGNOSIS — K219 Gastro-esophageal reflux disease without esophagitis: Secondary | ICD-10-CM | POA: Diagnosis present

## 2020-05-12 DIAGNOSIS — E559 Vitamin D deficiency, unspecified: Secondary | ICD-10-CM | POA: Diagnosis present

## 2020-05-12 DIAGNOSIS — T502X5A Adverse effect of carbonic-anhydrase inhibitors, benzothiadiazides and other diuretics, initial encounter: Secondary | ICD-10-CM | POA: Diagnosis present

## 2020-05-12 DIAGNOSIS — R0902 Hypoxemia: Secondary | ICD-10-CM

## 2020-05-12 DIAGNOSIS — E8809 Other disorders of plasma-protein metabolism, not elsewhere classified: Secondary | ICD-10-CM | POA: Diagnosis present

## 2020-05-12 DIAGNOSIS — F32A Depression, unspecified: Secondary | ICD-10-CM | POA: Diagnosis present

## 2020-05-12 DIAGNOSIS — I10 Essential (primary) hypertension: Secondary | ICD-10-CM | POA: Diagnosis present

## 2020-05-12 DIAGNOSIS — I509 Heart failure, unspecified: Secondary | ICD-10-CM | POA: Diagnosis not present

## 2020-05-12 DIAGNOSIS — D709 Neutropenia, unspecified: Secondary | ICD-10-CM | POA: Diagnosis present

## 2020-05-12 DIAGNOSIS — E876 Hypokalemia: Secondary | ICD-10-CM | POA: Diagnosis present

## 2020-05-12 DIAGNOSIS — R042 Hemoptysis: Secondary | ICD-10-CM

## 2020-05-12 DIAGNOSIS — R609 Edema, unspecified: Secondary | ICD-10-CM

## 2020-05-12 DIAGNOSIS — Z8249 Family history of ischemic heart disease and other diseases of the circulatory system: Secondary | ICD-10-CM

## 2020-05-12 DIAGNOSIS — Z79899 Other long term (current) drug therapy: Secondary | ICD-10-CM

## 2020-05-12 DIAGNOSIS — E538 Deficiency of other specified B group vitamins: Secondary | ICD-10-CM | POA: Diagnosis present

## 2020-05-12 LAB — COMPREHENSIVE METABOLIC PANEL
ALT: 83 U/L — ABNORMAL HIGH (ref 0–44)
AST: 155 U/L — ABNORMAL HIGH (ref 15–41)
Albumin: 3.7 g/dL (ref 3.5–5.0)
Alkaline Phosphatase: 83 U/L (ref 38–126)
Anion gap: 12 (ref 5–15)
BUN: 8 mg/dL (ref 6–20)
CO2: 25 mmol/L (ref 22–32)
Calcium: 9.1 mg/dL (ref 8.9–10.3)
Chloride: 97 mmol/L — ABNORMAL LOW (ref 98–111)
Creatinine, Ser: 0.78 mg/dL (ref 0.44–1.00)
GFR, Estimated: >60 mL/min (ref >60)
Glucose, Bld: 101 mg/dL — ABNORMAL HIGH (ref 70–99)
Potassium: 3.6 mmol/L (ref 3.5–5.1)
Sodium: 134 mmol/L — ABNORMAL LOW (ref 135–145)
Total Bilirubin: 0.8 mg/dL (ref 0.3–1.2)
Total Protein: 8.6 g/dL — ABNORMAL HIGH (ref 6.5–8.1)

## 2020-05-12 LAB — CBC WITH DIFFERENTIAL/PLATELET
Abs Immature Granulocytes: 0.01 10*3/uL (ref 0.00–0.07)
Basophils Absolute: 0 10*3/uL (ref 0.0–0.1)
Basophils Relative: 1 %
Eosinophils Absolute: 0.1 10*3/uL (ref 0.0–0.5)
Eosinophils Relative: 5 %
HCT: 38.3 % (ref 36.0–46.0)
Hemoglobin: 12.3 g/dL (ref 12.0–15.0)
Immature Granulocytes: 0 %
Lymphocytes Relative: 40 %
Lymphs Abs: 1 10*3/uL (ref 0.7–4.0)
MCH: 34.5 pg — ABNORMAL HIGH (ref 26.0–34.0)
MCHC: 32.1 g/dL (ref 30.0–36.0)
MCV: 107.3 fL — ABNORMAL HIGH (ref 80.0–100.0)
Monocytes Absolute: 0.4 10*3/uL (ref 0.1–1.0)
Monocytes Relative: 16 %
Neutro Abs: 0.9 10*3/uL — ABNORMAL LOW (ref 1.7–7.7)
Neutrophils Relative %: 38 %
Platelets: 280 10*3/uL (ref 150–400)
RBC: 3.57 MIL/uL — ABNORMAL LOW (ref 3.87–5.11)
RDW: 14 % (ref 11.5–15.5)
WBC: 2.4 10*3/uL — ABNORMAL LOW (ref 4.0–10.5)
nRBC: 0 % (ref 0.0–0.2)

## 2020-05-12 LAB — BRAIN NATRIURETIC PEPTIDE: B Natriuretic Peptide: 49.4 pg/mL (ref 0.0–100.0)

## 2020-05-12 LAB — RESP PANEL BY RT-PCR (FLU A&B, COVID) ARPGX2
Influenza A by PCR: NEGATIVE
Influenza B by PCR: NEGATIVE
SARS Coronavirus 2 by RT PCR: NEGATIVE

## 2020-05-12 LAB — I-STAT BETA HCG BLOOD, ED (MC, WL, AP ONLY): I-stat hCG, quantitative: <5 m[IU]/mL (ref <5)

## 2020-05-12 LAB — D-DIMER, QUANTITATIVE: D-Dimer, Quant: 0.51 ug/mL-FEU — ABNORMAL HIGH (ref 0.00–0.50)

## 2020-05-12 LAB — TROPONIN I (HIGH SENSITIVITY): Troponin I (High Sensitivity): 3 ng/L (ref <18)

## 2020-05-12 NOTE — ED Triage Notes (Signed)
Pt reports bilateral leg swelling and leg pain for several weeks. Pt sts she has been compliant with lasix and gabapentin.

## 2020-05-12 NOTE — ED Provider Notes (Signed)
[name]   [name]: [id number] Arrival date & time: 05/12/20  1930     History Chief Complaint  Patient presents with  . Leg Swelling    Hannah Orr is a 45 y.o. female.  The history is provided by the patient and medical records. No language interpreter was used.  Shortness of Breath Severity:  Moderate Onset quality:  Gradual Timing:  Intermittent Progression:  Waxing and waning Chronicity:  New Context: not URI   Relieved by:  Sitting up Worsened by:  Exertion and deep breathing (layuing flat) Ineffective treatments:  None tried Associated symptoms: chest pain, cough, hemoptysis and sputum production   Associated symptoms: no abdominal pain, no claudication, no fever, no headaches, no neck pain, no rash, no swollen glands, no vomiting and no wheezing   Risk factors: obesity        Past Medical History:  Diagnosis Date  . Diabetes mellitus   . Hypertension     Patient Active Problem List   Diagnosis Date Noted  . Cervical paraspinal muscle spasm 10/08/2015  . Intramural leiomyoma of uterus 01/12/2014  . Pelvic mass in female 12/28/2013  . Nonspecific elevation of levels of transaminase or lactic acid dehydrogenase (LDH) 11/24/2013  . GERD (gastroesophageal reflux disease) 11/24/2013  . Other abnormal glucose 08/08/2013  . Severe obesity (BMI >= 40) ([place]) 08/08/2013  . Flow murmur 10/21/2012  . Routine general medical examination at a health care facility 10/17/2012  . Depression, acute 04/25/2011  . Essential hypertension, benign 12/26/2010    History reviewed. No pertinent surgical history.   OB History   No obstetric history on file.     Family History  Problem Relation Age of Onset  . Hypertension Mother   . Hypertension Father   . Cancer Neg Hx   . Heart disease Neg Hx   . Hyperlipidemia Neg Hx   . Kidney disease Neg Hx   . Diabetes Neg Hx   . Early death Neg Hx   . Hearing loss Neg Hx    . Stroke Neg Hx     Social History   Tobacco Use  . Smoking status: Never Smoker  . Smokeless tobacco: Never Used  Substance Use Topics  . Alcohol use: No  . Drug use: No    Home Medications Prior to Admission medications   Medication Sig Start Date End Date Taking? Authorizing Provider  albuterol (PROVENTIL HFA;VENTOLIN HFA) 108 (90 Base) MCG/ACT inhaler Inhale 2 puffs into the lungs every 6 (six) hours as needed for wheezing. 02/25/16   Nche, Charlene Brooke, NP  amLODipine (NORVASC) 10 MG tablet Take 1 tablet (10 mg total) by mouth daily. 07/07/16   Golden Circle, FNP  benzonatate (TESSALON PERLES) 100 MG capsule Take 1 capsule (100 mg total) by mouth 3 (three) times daily as needed for cough. 05/25/16   Lucretia Kern, DO  cefdinir (OMNICEF) 300 MG capsule Take 1 capsule (300 mg total) by mouth 2 (two) times daily. 02/09/16   Nche, Charlene Brooke, NP  Diclofenac Sodium (PENNSAID) 2 % SOLN Place 1 application onto the skin 2 times daily at 12 noon and 4 pm. 12/15/15   Nche, Charlene Brooke, NP  doxycycline (VIBRA-TABS) 100 MG tablet Take 1 tablet (100 mg total) by mouth 2 (two) times daily. 05/25/16   Lucretia Kern, DO  FLUoxetine (PROZAC) 20 MG capsule TAKE ONE CAPSULE BY MOUTH DAILY 03/13/16   Janith Lima, MD  fluticasone [facility]) 50  MCG/ACT nasal spray Place 2 sprays into both nostrils daily. 02/09/16   Nche, Charlene Brooke, NP  gabapentin (NEURONTIN) 100 MG capsule Take 1 capsule (100 mg total) by mouth 3 (three) times daily for 10 days. 04/27/20 05/07/20  Long, Wonda Olds, MD  guaiFENesin (MUCINEX) 600 MG 12 hr tablet Take 1 tablet (600 mg total) by mouth 2 (two) times daily as needed for cough or to loosen phlegm. 02/09/16   Nche, Charlene Brooke, NP  hydrochlorothiazide (HYDRODIURIL) 25 MG tablet TAKE ONE TABLET BY MOUTH ONCE DAILY 05/19/16   Janith Lima, MD  Multiple Vitamins-Calcium (ONE-A-DAY WOMENS PO) Take 1 each by mouth daily.      [provider]  oxymetazoline  (AFRIN NASAL SPRAY) 0.05 % nasal spray Place 1 spray into both nostrils 2 (two) times daily. Use only for 3days, then stop 02/09/16   Nche, Charlene Brooke, NP    Allergies    Benazepril hcl and Benazepril hcl  Review of Systems   Review of Systems  Constitutional: Positive for fatigue. Negative for chills and fever.  HENT: Negative for congestion.   Eyes: Negative for visual disturbance.  Respiratory: Positive for cough, hemoptysis, sputum production, chest tightness and shortness of breath. Negative for wheezing and stridor.   Cardiovascular: Positive for chest pain and leg swelling. Negative for palpitations and claudication.  Gastrointestinal: Negative for abdominal pain, constipation, diarrhea, nausea and vomiting.  Genitourinary: Negative for dysuria, flank pain and frequency.  Musculoskeletal: Negative for back pain, neck pain and neck stiffness.  Skin: Negative for rash and wound.  Neurological: Negative for speech difficulty, light-headedness, numbness and headaches.  Psychiatric/Behavioral: Negative for agitation and confusion.  All other systems reviewed and are negative.   Physical Exam Updated Vital Signs BP (!) 172/96 (BP Location: Left Arm)   Pulse 98   Temp 98.6 F (37 C) (Oral)   Resp 20   LMP  (LMP Unknown)   SpO2 92%   Physical Exam Vitals and nursing note reviewed.  Constitutional:      General: She is not in acute distress.    Appearance: She is well-developed. She is not ill-appearing, toxic-appearing or diaphoretic.  HENT:     Head: Normocephalic and atraumatic.     Nose: No congestion or rhinorrhea.     Mouth/Throat:     Mouth: Mucous membranes are moist.     Pharynx: No oropharyngeal exudate or posterior oropharyngeal erythema.  Eyes:     Extraocular Movements: Extraocular movements intact.     Conjunctiva/sclera: Conjunctivae normal.     Pupils: Pupils are equal, round, and reactive to light.  Cardiovascular:     Rate and Rhythm: Normal rate and  regular rhythm.     Heart sounds: No murmur heard.   Pulmonary:     Effort: Pulmonary effort is normal. No respiratory distress.     Breath sounds: Rales present. No wheezing or rhonchi.  Chest:     Chest wall: No tenderness.  Abdominal:     General: Abdomen is flat.     Palpations: Abdomen is soft.     Tenderness: There is no abdominal tenderness. There is no right CVA tenderness, left CVA tenderness, guarding or rebound.  Musculoskeletal:        General: No tenderness.     Cervical back: Neck supple. No tenderness.     Right lower leg: Edema present.     Left lower leg: Edema present.  Skin:    General: Skin is warm and dry.  Capillary Refill: Capillary refill takes less than 2 seconds.     Findings: No erythema or rash.  Neurological:     General: No focal deficit present.     Mental Status: She is alert.     Sensory: No sensory deficit.     Motor: No weakness.  Psychiatric:        Mood and Affect: Mood normal.     ED Results / Procedures / Treatments   Labs (all labs ordered are listed, but only abnormal results are displayed) Labs Reviewed  CBC WITH DIFFERENTIAL/PLATELET - Abnormal; Notable for the following components:      Result Value   WBC 2.4 (*)    RBC 3.57 (*)    MCV 107.3 (*)    MCH 34.5 (*)    Neutro Abs 0.9 (*)    All other components within normal limits  COMPREHENSIVE METABOLIC PANEL - Abnormal; Notable for the following components:   Sodium 134 (*)    Chloride 97 (*)    Glucose, Bld 101 (*)    Total Protein 8.6 (*)    AST 155 (*)    ALT 83 (*)    All other components within normal limits  D-DIMER, QUANTITATIVE - Abnormal; Notable for the following components:   D-Dimer, Quant 0.51 (*)    All other components within normal limits  RESP PANEL BY RT-PCR (FLU A&B, COVID) [id number]  URINE CULTURE  BRAIN NATRIURETIC PEPTIDE  URINALYSIS, ROUTINE W REFLEX MICROSCOPIC  I-STAT BETA HCG BLOOD, ED ([facility], [facility], [facility] ONLY)  TROPONIN I (HIGH SENSITIVITY)     EKG EKG Interpretation  Date/Time:  Wednesday May 12 2020 22:59:06 EDT Ventricular Rate:  94 PR Interval:    QRS Duration: 81 QT Interval:  374 QTC Calculation: 468 R Axis:   56 Text Interpretation: Sinus rhythm When compared to prior, similar to prior. No STEMI Confirmed by Antony Blackbird [phone]) on 05/12/2020 11:01:25 PM   Radiology DG Chest 2 View  Result Date: 05/12/2020 CLINICAL DATA:  Chest pain short of breath EXAM: CHEST - 2 VIEW COMPARISON:  04/27/2020 FINDINGS: Mild cardiomegaly. No focal opacity or pleural effusion. No pneumothorax. IMPRESSION: Mild cardiomegaly. Electronically Signed   By: Donavan Foil M.D.   On: 05/12/2020 23:26    Procedures Procedures   Medications Ordered in ED Medications - No data to display  ED Course  I have reviewed the triage vital signs and the nursing notes.  Pertinent labs & imaging results that were available during my care of the patient were reviewed by me and considered in my medical decision making (see chart for details).    MDM Rules/Calculators/A&P                          TIESHIA RETTINGER is a 45 y.o. female with a past medical history significant for diabetes, hypertension, obesity, and depression who presents with worsening bilateral lower extremity edema, exertional shortness of breath, orthopnea, chest discomfort, scant hemoptysis, and fatigue.  Patient reports that she was placed on a diuretic several weeks ago for peripheral edema.  She says that her edema has continued to subjectively worsen and now she is having exertional shortness of breath.  She reports she cannot lay flat which she was able to do before.  She also reports occasional hemoptysis and has chest discomfort that comes and goes.  She reports it is more of a tightness in her chest.  She reports that she gets very  winded when she tries to walk around and has never formally been diagnosed with heart failure.  She reports no significant dysuria or  hematuria.  She denies any trauma.  She reports her legs have been swollen bilaterally.  She reports that she recently had work-up including negative DVT ultrasounds but despite increasing her Lasix and taking more gabapentin for her leg pains, her symptoms worsen.  On exam, lungs are difficult to auscultate due to body habitus but there were some faint rales present.  I did not appreciate a murmur.  Chest and abdomen were nontender on my exam.  Legs are very edematous with pitting edema bilaterally.  She did have intact DP pulse bilaterally.  Intact sensation and strength in legs.  Intact radial pulses bilaterally.  EKG shows no stemi.   During my exam, patient became hypoxic while sitting up and talking to me.  Her oxygen saturation dropped to 86 with a good waveform just during conversation.  She will be placed on oxygen.  Clinically I suspect she is having fluid overload and potentially new heart failure.  With her hemoptysis, hypoxia, chest discomfort, shortness of breath, I will get a D-dimer to help rule out thromboembolic disease.  Due to the hypoxia with conversation, anticipate admission when work-up is completed.  12:02 AM D-dimer is elevated, will get CT PE study.  BNP is surprisingly not elevated.  Chest x-ray shows cardiomegaly.  She still denies a history of heart failure but that is my concern.  Initial troponin negative.,  Will trend.  Covid and flu test negative.  We will get the CT PE study and have the patient ambulate with pulse oximetry on room air to see if she gets hypoxic.  She was hypoxic with conversation earlier so I would not be surprised if it is low.  Patient did ambulate and was hypoxic in the 80s.  She was very symptomatic.  She is now on 2 L and she will need to be admitted after PE study is completed.  Care transferred to oncoming team awaiting for results of work-up.  Due to the hypoxic, anticipate admission.  Suspect fluid overload although patient does not have a  formal diagnosis of heart failure or evidence of prior cardiac echo in the chart.   Final Clinical Impression(s) / ED Diagnoses Final diagnoses:  Hypoxia  Hemoptysis  Peripheral edema  Exertional shortness of breath  Chest tightness     Clinical Impression: 1. Hypoxia   2. Hemoptysis   3. Peripheral edema   4. Exertional shortness of breath   5. Chest tightness     Disposition: Admit  This note was prepared with assistance of Dragon voice recognition software. Occasional wrong-word or sound-a-like substitutions may have occurred due to the inherent limitations of voice recognition software.      Ashrith Orr, Gwenyth Allegra, MD 05/13/20 [phone]

## 2020-05-13 ENCOUNTER — Encounter (HOSPITAL_COMMUNITY): Payer: Self-pay | Admitting: Internal Medicine

## 2020-05-13 ENCOUNTER — Observation Stay (HOSPITAL_COMMUNITY): Payer: BC Managed Care – PPO

## 2020-05-13 ENCOUNTER — Emergency Department (HOSPITAL_COMMUNITY): Payer: BC Managed Care – PPO

## 2020-05-13 DIAGNOSIS — R0602 Shortness of breath: Secondary | ICD-10-CM

## 2020-05-13 DIAGNOSIS — D709 Neutropenia, unspecified: Secondary | ICD-10-CM | POA: Diagnosis present

## 2020-05-13 DIAGNOSIS — R600 Localized edema: Secondary | ICD-10-CM | POA: Diagnosis not present

## 2020-05-13 DIAGNOSIS — F32A Depression, unspecified: Secondary | ICD-10-CM | POA: Diagnosis present

## 2020-05-13 DIAGNOSIS — E8809 Other disorders of plasma-protein metabolism, not elsewhere classified: Secondary | ICD-10-CM | POA: Diagnosis present

## 2020-05-13 DIAGNOSIS — R609 Edema, unspecified: Secondary | ICD-10-CM | POA: Diagnosis not present

## 2020-05-13 DIAGNOSIS — R748 Abnormal levels of other serum enzymes: Secondary | ICD-10-CM

## 2020-05-13 DIAGNOSIS — E538 Deficiency of other specified B group vitamins: Secondary | ICD-10-CM | POA: Diagnosis present

## 2020-05-13 DIAGNOSIS — I509 Heart failure, unspecified: Secondary | ICD-10-CM

## 2020-05-13 DIAGNOSIS — I160 Hypertensive urgency: Secondary | ICD-10-CM | POA: Diagnosis present

## 2020-05-13 DIAGNOSIS — Z20822 Contact with and (suspected) exposure to covid-19: Secondary | ICD-10-CM | POA: Diagnosis present

## 2020-05-13 DIAGNOSIS — Z8249 Family history of ischemic heart disease and other diseases of the circulatory system: Secondary | ICD-10-CM | POA: Diagnosis not present

## 2020-05-13 DIAGNOSIS — E876 Hypokalemia: Secondary | ICD-10-CM | POA: Diagnosis present

## 2020-05-13 DIAGNOSIS — Z79899 Other long term (current) drug therapy: Secondary | ICD-10-CM | POA: Diagnosis not present

## 2020-05-13 DIAGNOSIS — E559 Vitamin D deficiency, unspecified: Secondary | ICD-10-CM | POA: Diagnosis present

## 2020-05-13 DIAGNOSIS — I10 Essential (primary) hypertension: Secondary | ICD-10-CM | POA: Diagnosis present

## 2020-05-13 DIAGNOSIS — K219 Gastro-esophageal reflux disease without esophagitis: Secondary | ICD-10-CM | POA: Diagnosis present

## 2020-05-13 DIAGNOSIS — F419 Anxiety disorder, unspecified: Secondary | ICD-10-CM | POA: Diagnosis present

## 2020-05-13 DIAGNOSIS — J9601 Acute respiratory failure with hypoxia: Secondary | ICD-10-CM | POA: Diagnosis not present

## 2020-05-13 DIAGNOSIS — Z6841 Body Mass Index (BMI) 40.0 and over, adult: Secondary | ICD-10-CM | POA: Diagnosis not present

## 2020-05-13 DIAGNOSIS — D72819 Decreased white blood cell count, unspecified: Secondary | ICD-10-CM

## 2020-05-13 DIAGNOSIS — Z888 Allergy status to other drugs, medicaments and biological substances status: Secondary | ICD-10-CM | POA: Diagnosis not present

## 2020-05-13 DIAGNOSIS — R0600 Dyspnea, unspecified: Secondary | ICD-10-CM | POA: Diagnosis not present

## 2020-05-13 DIAGNOSIS — T502X5A Adverse effect of carbonic-anhydrase inhibitors, benzothiadiazides and other diuretics, initial encounter: Secondary | ICD-10-CM | POA: Diagnosis present

## 2020-05-13 LAB — COMPREHENSIVE METABOLIC PANEL
ALT: 94 U/L — ABNORMAL HIGH (ref 0–44)
AST: 196 U/L — ABNORMAL HIGH (ref 15–41)
Albumin: 3.8 g/dL (ref 3.5–5.0)
Alkaline Phosphatase: 87 U/L (ref 38–126)
Anion gap: 15 (ref 5–15)
BUN: 7 mg/dL (ref 6–20)
CO2: 24 mmol/L (ref 22–32)
Calcium: 8.8 mg/dL — ABNORMAL LOW (ref 8.9–10.3)
Chloride: 96 mmol/L — ABNORMAL LOW (ref 98–111)
Creatinine, Ser: 0.68 mg/dL (ref 0.44–1.00)
GFR, Estimated: >60 mL/min (ref >60)
Glucose, Bld: 95 mg/dL (ref 70–99)
Potassium: 3.3 mmol/L — ABNORMAL LOW (ref 3.5–5.1)
Sodium: 135 mmol/L (ref 135–145)
Total Bilirubin: 1 mg/dL (ref 0.3–1.2)
Total Protein: 9 g/dL — ABNORMAL HIGH (ref 6.5–8.1)

## 2020-05-13 LAB — CBC WITH DIFFERENTIAL/PLATELET
Abs Immature Granulocytes: 0 10*3/uL (ref 0.00–0.07)
Basophils Absolute: 0 10*3/uL (ref 0.0–0.1)
Basophils Relative: 1 %
Eosinophils Absolute: 0.1 10*3/uL (ref 0.0–0.5)
Eosinophils Relative: 6 %
HCT: 36.7 % (ref 36.0–46.0)
Hemoglobin: 12.2 g/dL (ref 12.0–15.0)
Immature Granulocytes: 0 %
Lymphocytes Relative: 37 %
Lymphs Abs: 0.8 10*3/uL (ref 0.7–4.0)
MCH: 35.5 pg — ABNORMAL HIGH (ref 26.0–34.0)
MCHC: 33.2 g/dL (ref 30.0–36.0)
MCV: 106.7 fL — ABNORMAL HIGH (ref 80.0–100.0)
Monocytes Absolute: 0.4 10*3/uL (ref 0.1–1.0)
Monocytes Relative: 18 %
Neutro Abs: 0.8 10*3/uL — ABNORMAL LOW (ref 1.7–7.7)
Neutrophils Relative %: 38 %
Platelets: 271 10*3/uL (ref 150–400)
RBC: 3.44 MIL/uL — ABNORMAL LOW (ref 3.87–5.11)
RDW: 14.1 % (ref 11.5–15.5)
WBC: 2 10*3/uL — ABNORMAL LOW (ref 4.0–10.5)
nRBC: 0 % (ref 0.0–0.2)

## 2020-05-13 LAB — URINALYSIS, ROUTINE W REFLEX MICROSCOPIC
Bilirubin Urine: NEGATIVE
Glucose, UA: NEGATIVE mg/dL
Hgb urine dipstick: NEGATIVE
Ketones, ur: NEGATIVE mg/dL
Leukocytes,Ua: NEGATIVE
Nitrite: NEGATIVE
Protein, ur: NEGATIVE mg/dL
Specific Gravity, Urine: 1.006 (ref 1.005–1.030)
pH: 7 (ref 5.0–8.0)

## 2020-05-13 LAB — RETICULOCYTES
Immature Retic Fract: 14.4 % (ref 2.3–15.9)
RBC.: 3.55 MIL/uL — ABNORMAL LOW (ref 3.87–5.11)
Retic Count, Absolute: 49.7 10*3/uL (ref 19.0–186.0)
Retic Ct Pct: 1.4 % (ref 0.4–3.1)

## 2020-05-13 LAB — VITAMIN B12: Vitamin B-12: 152 pg/mL — ABNORMAL LOW (ref 180–914)

## 2020-05-13 LAB — IRON AND TIBC
Iron: 68 ug/dL (ref 28–170)
Saturation Ratios: 15 % (ref 10.4–31.8)
TIBC: 439 ug/dL (ref 250–450)
UIBC: 371 ug/dL

## 2020-05-13 LAB — CK: Total CK: 53 U/L (ref 38–234)

## 2020-05-13 LAB — FOLATE: Folate: 5.6 ng/mL — ABNORMAL LOW (ref >5.9)

## 2020-05-13 LAB — TROPONIN I (HIGH SENSITIVITY): Troponin I (High Sensitivity): 3 ng/L (ref <18)

## 2020-05-13 LAB — ECHOCARDIOGRAM COMPLETE
Area-P 1/2: 4.89 cm2
S' Lateral: 2.97 cm

## 2020-05-13 LAB — MAGNESIUM: Magnesium: 1.8 mg/dL (ref 1.7–2.4)

## 2020-05-13 LAB — FERRITIN: Ferritin: 203 ng/mL (ref 11–307)

## 2020-05-13 LAB — TSH: TSH: 6.599 u[IU]/mL — ABNORMAL HIGH (ref 0.350–4.500)

## 2020-05-13 LAB — HIV ANTIBODY (ROUTINE TESTING W REFLEX): HIV Screen 4th Generation wRfx: NONREACTIVE

## 2020-05-13 MED ORDER — ALBUTEROL SULFATE HFA 108 (90 BASE) MCG/ACT IN AERS: 2.0000 | INHALATION_SPRAY | Freq: Four times a day (QID) | RESPIRATORY_TRACT | Status: DC | PRN

## 2020-05-13 MED ORDER — FOLIC ACID 1 MG PO TABS
1.0000 mg | ORAL_TABLET | Freq: Every day | ORAL | Status: DC
  Administered 2020-05-14: 1 mg via ORAL
  Filled 2020-05-13: qty 1

## 2020-05-13 MED ORDER — FLUOXETINE HCL 20 MG PO CAPS
40.0000 mg | ORAL_CAPSULE | Freq: Every day | ORAL | Status: DC
  Administered 2020-05-13 – 2020-05-14 (×2): 40 mg via ORAL
  Filled 2020-05-13 (×2): qty 2

## 2020-05-13 MED ORDER — FUROSEMIDE 10 MG/ML IJ SOLN
40.0000 mg | Freq: Once | INTRAMUSCULAR | Status: AC
  Administered 2020-05-13: 40 mg via INTRAVENOUS
  Filled 2020-05-13: qty 4

## 2020-05-13 MED ORDER — GABAPENTIN 100 MG PO CAPS
100.0000 mg | ORAL_CAPSULE | Freq: Three times a day (TID) | ORAL | Status: DC
  Administered 2020-05-13 – 2020-05-14 (×4): 100 mg via ORAL
  Filled 2020-05-13 (×4): qty 1

## 2020-05-13 MED ORDER — POTASSIUM CHLORIDE CRYS ER 20 MEQ PO TBCR
40.0000 meq | EXTENDED_RELEASE_TABLET | Freq: Once | ORAL | Status: AC
  Administered 2020-05-13: 40 meq via ORAL
  Filled 2020-05-13: qty 2

## 2020-05-13 MED ORDER — CYANOCOBALAMIN 1000 MCG/ML IJ SOLN: 1000.0000 ug | INTRAMUSCULAR | Status: DC

## 2020-05-13 MED ORDER — ENOXAPARIN SODIUM 80 MG/0.8ML ~~LOC~~ SOLN
80.0000 mg | SUBCUTANEOUS | Status: DC
  Administered 2020-05-13 – 2020-05-14 (×2): 80 mg via SUBCUTANEOUS
  Filled 2020-05-13 (×2): qty 0.8

## 2020-05-13 MED ORDER — ACETAMINOPHEN 325 MG PO TABS: 650.0000 mg | ORAL_TABLET | Freq: Four times a day (QID) | ORAL | Status: DC | PRN

## 2020-05-13 MED ORDER — IOHEXOL 350 MG/ML SOLN
100.0000 mL | Freq: Once | INTRAVENOUS | Status: AC | PRN
  Administered 2020-05-13: 100 mL via INTRAVENOUS

## 2020-05-13 MED ORDER — CYANOCOBALAMIN 1000 MCG/ML IJ SOLN
1000.0000 ug | Freq: Every day | INTRAMUSCULAR | Status: DC
  Administered 2020-05-14: 1000 ug via INTRAMUSCULAR
  Filled 2020-05-13 (×2): qty 1

## 2020-05-13 MED ORDER — CHLORTHALIDONE 25 MG PO TABS
25.0000 mg | ORAL_TABLET | Freq: Every day | ORAL | Status: DC
  Filled 2020-05-13: qty 1

## 2020-05-13 MED ORDER — ACETAMINOPHEN 650 MG RE SUPP: 650.0000 mg | Freq: Four times a day (QID) | RECTAL | Status: DC | PRN

## 2020-05-13 MED ORDER — POTASSIUM CHLORIDE CRYS ER 20 MEQ PO TBCR
40.0000 meq | EXTENDED_RELEASE_TABLET | ORAL | Status: AC
  Administered 2020-05-13 (×2): 40 meq via ORAL
  Filled 2020-05-13 (×2): qty 2

## 2020-05-13 MED ORDER — POTASSIUM CHLORIDE CRYS ER 20 MEQ PO TBCR: 20.0000 meq | EXTENDED_RELEASE_TABLET | Freq: Every day | ORAL | Status: DC

## 2020-05-13 MED ORDER — HYDRALAZINE HCL 20 MG/ML IJ SOLN: 10.0000 mg | INTRAMUSCULAR | Status: DC | PRN

## 2020-05-13 MED ORDER — FUROSEMIDE 10 MG/ML IJ SOLN
40.0000 mg | Freq: Two times a day (BID) | INTRAMUSCULAR | Status: DC
  Administered 2020-05-13 – 2020-05-14 (×3): 40 mg via INTRAVENOUS
  Filled 2020-05-13 (×3): qty 4

## 2020-05-13 MED ORDER — CARVEDILOL 12.5 MG PO TABS
12.5000 mg | ORAL_TABLET | Freq: Two times a day (BID) | ORAL | Status: DC
  Administered 2020-05-13 – 2020-05-14 (×3): 12.5 mg via ORAL
  Filled 2020-05-13 (×3): qty 1

## 2020-05-13 MED ORDER — ENOXAPARIN SODIUM 80 MG/0.8ML ~~LOC~~ SOLN
70.0000 mg | SUBCUTANEOUS | Status: DC
  Filled 2020-05-13: qty 0.7

## 2020-05-13 MED ORDER — METOPROLOL SUCCINATE ER 50 MG PO TB24: 50.0000 mg | ORAL_TABLET | Freq: Every day | ORAL | Status: DC

## 2020-05-13 MED ORDER — AMLODIPINE BESYLATE 10 MG PO TABS
10.0000 mg | ORAL_TABLET | Freq: Every day | ORAL | Status: DC
  Administered 2020-05-13 – 2020-05-14 (×2): 10 mg via ORAL
  Filled 2020-05-13: qty 1
  Filled 2020-05-13: qty 2

## 2020-05-13 NOTE — H&P (Signed)
History and Physical    Hannah Orr ASN:[id number] DOB: 12/09/1975 DOA: 05/12/2020  PCP: Katherina Mires, MD  Patient coming from: Home.  Chief Complaint: Lower extremity edema.  HPI: Hannah Orr is a 45 y.o. female with history of hypertension presents to the ER with complaints of increasing lower extremity edema.  Patient states she has been having increasing lower extremity edema over the last 2 to 3 weeks and was placed on Lasix despite taking which patient is edema is worsening.  Has some exertional shortness of breath.  Denies chest pain productive cough fever or chills.  ED Course: In the ER patient was getting short of breath on exertion and getting mildly hypoxic in exertion.  CT angiogram of the chest does show features concerning for pulmonary hypertension and CHF.  BNP I sensitive troponin EKG unremarkable Covid test was negative.  Patient was given Lasix 40 mg IV admitted for CHF.  Blood pressure is elevated with systolic more than 193.  Covid test was negative.  Review of Systems: As per HPI, rest all negative.   Past Medical History:  Diagnosis Date  . Diabetes mellitus   . Hypertension     History reviewed. No pertinent surgical history.   reports that she has never smoked. She has never used smokeless tobacco. She reports that she does not drink alcohol and does not use drugs.  Allergies  Allergen Reactions  . Benazepril Hcl Hypertension    cough  . Benazepril Hcl     cough    Family History  Problem Relation Age of Onset  . Hypertension Mother   . Hypertension Father   . Cancer Neg Hx   . Heart disease Neg Hx   . Hyperlipidemia Neg Hx   . Kidney disease Neg Hx   . Diabetes Neg Hx   . Early death Neg Hx   . Hearing loss Neg Hx   . Stroke Neg Hx     Prior to Admission medications   Medication Sig Start Date End Date Taking? Authorizing Provider  albuterol (PROVENTIL HFA;VENTOLIN HFA) 108 (90 Base) MCG/ACT inhaler Inhale 2 puffs into the  lungs every 6 (six) hours as needed for wheezing. 02/25/16  Yes Nche, Charlene Brooke, NP  amLODipine (NORVASC) 10 MG tablet Take 1 tablet (10 mg total) by mouth daily. 07/07/16  Yes Golden Circle, FNP  chlorthalidone (HYGROTON) 25 MG tablet Take 25 mg by mouth daily.   Yes [provider]  FLUoxetine (PROZAC) 40 MG capsule Take 40 mg by mouth daily.   Yes [provider]  furosemide (LASIX) 20 MG tablet Take 40 mg by mouth daily.   Yes [provider]  gabapentin (NEURONTIN) 100 MG capsule Take 1 capsule (100 mg total) by mouth 3 (three) times daily for 10 days. 04/27/20 05/07/20 Yes Long, Wonda Olds, MD  metoprolol succinate (TOPROL-XL) 50 MG 24 hr tablet Take 50 mg by mouth daily. Take with or immediately following a meal.   Yes [provider]  potassium chloride (MICRO-K) 10 MEQ CR capsule Take 10 mEq by mouth 2 (two) times daily.   Yes [provider]    Physical Exam: Constitutional: Moderately built and nourished. Vitals:   05/13/20 0000 05/13/20 0045 05/13/20 0130 05/13/20 0318  BP: 137/86 (!) 158/89 133/84 (!) 176/88  Pulse: 90 90 89 88  Resp: (!) 22 17 (!) 22 17  Temp:    98 F (36.7 C)  TempSrc:    Oral  SpO2:  99% 100% 96% 95%   Eyes: Anicteric no pallor. ENMT: No discharge from the ears eyes nose or mouth. Neck: No JVD appreciated no mass felt. Respiratory: No rhonchi or crepitations. Cardiovascular: S1-S2 heard. Abdomen: Soft nontender bowel sounds present. Musculoskeletal: Bilateral lower extreme edema present. Skin: No rash. Neurologic: Alert awake oriented to time place and person.  Moves all extremities. Psychiatric: Appears normal.  Normal affect.   Labs on Admission: I have personally reviewed following labs and imaging studies  CBC: Recent Labs  Lab 05/12/20 2241  WBC 2.4*  NEUTROABS 0.9*  HGB 12.3  HCT 38.3  MCV 107.3*  PLT 962   Basic Metabolic Panel: Recent Labs  Lab 05/12/20 2241  NA 134*  K 3.6  CL  97*  CO2 25  GLUCOSE 101*  BUN 8  CREATININE 0.78  CALCIUM 9.1   GFR: CrCl cannot be calculated (Unknown ideal weight.). Liver Function Tests: Recent Labs  Lab 05/12/20 2241  AST 155*  ALT 83*  ALKPHOS 83  BILITOT 0.8  PROT 8.6*  ALBUMIN 3.7   No results for input(s): LIPASE, AMYLASE in the last 168 hours. No results for input(s): AMMONIA in the last 168 hours. Coagulation Profile: No results for input(s): INR, PROTIME in the last 168 hours. Cardiac Enzymes: No results for input(s): CKTOTAL, CKMB, CKMBINDEX, TROPONINI in the last 168 hours. BNP (last 3 results) No results for input(s): PROBNP in the last 8760 hours. HbA1C: No results for input(s): HGBA1C in the last 72 hours. CBG: No results for input(s): GLUCAP in the last 168 hours. Lipid Profile: No results for input(s): CHOL, HDL, LDLCALC, TRIG, CHOLHDL, LDLDIRECT in the last 72 hours. Thyroid Function Tests: No results for input(s): TSH, T4TOTAL, FREET4, T3FREE, THYROIDAB in the last 72 hours. Anemia Panel: No results for input(s): VITAMINB12, FOLATE, FERRITIN, TIBC, IRON, RETICCTPCT in the last 72 hours. Urine analysis:    Component Value Date/Time   COLORURINE YELLOW 05/12/2020 2243   APPEARANCEUR CLEAR 05/12/2020 2243   LABSPEC 1.006 05/12/2020 2243   PHURINE 7.0 05/12/2020 2243   GLUCOSEU NEGATIVE 05/12/2020 2243   GLUCOSEU NEGATIVE 11/24/2013 1201   HGBUR NEGATIVE 05/12/2020 2243   BILIRUBINUR NEGATIVE 05/12/2020 2243   KETONESUR NEGATIVE 05/12/2020 2243   PROTEINUR NEGATIVE 05/12/2020 2243   UROBILINOGEN 1.0 11/24/2013 1201   NITRITE NEGATIVE 05/12/2020 2243   LEUKOCYTESUR NEGATIVE 05/12/2020 2243   Sepsis Labs: @LABRCNTIP (procalcitonin:4,lacticidven:4) ) Recent Results (from the past 240 hour(s))  Resp Panel by RT-PCR (Flu A&B, Covid) Nasopharyngeal Swab     Status: None   Collection Time: 05/12/20 10:42 PM   Specimen: Nasopharyngeal Swab; Nasopharyngeal(NP) swabs in vial transport medium   Result Value Ref Range Status   SARS Coronavirus 2 by RT PCR NEGATIVE NEGATIVE Final    Comment: (NOTE) SARS-CoV-2 target nucleic acids are NOT DETECTED.  The SARS-CoV-2 RNA is generally detectable in upper respiratory specimens during the acute phase of infection. The lowest concentration of SARS-CoV-2 viral copies this assay can detect is 138 copies/mL. A negative result does not preclude SARS-Cov-2 infection and should not be used as the sole basis for treatment or other patient management decisions. A negative result may occur with  improper specimen collection/handling, submission of specimen other than nasopharyngeal swab, presence of viral mutation(s) within the areas targeted by this assay, and inadequate number of viral copies(<138 copies/mL). A negative result must be combined with clinical observations, patient history, and epidemiological information. The expected result is Negative.  Fact Sheet for Patients:  EntrepreneurPulse.com.au  Fact Sheet for Healthcare Providers:  IncredibleEmployment.be  This test is no t yet approved or cleared by the [place] FDA and  has been authorized for detection and/or diagnosis of SARS-CoV-2 by FDA under an Emergency Use Authorization (EUA). This EUA will remain  in effect (meaning this test can be used) for the duration of the COVID-19 declaration under Section 564(b)(1) of the Act, 21 U.S.C.section 360bbb-3(b)(1), unless the authorization is terminated  or revoked sooner.       Influenza A by PCR NEGATIVE NEGATIVE Final   Influenza B by PCR NEGATIVE NEGATIVE Final    Comment: (NOTE) The Xpert Xpress SARS-CoV-2/FLU/RSV plus assay is intended as an aid in the diagnosis of influenza from Nasopharyngeal swab specimens and should not be used as a sole basis for treatment. Nasal washings and aspirates are unacceptable for Xpert Xpress SARS-CoV-2/FLU/RSV testing.  Fact Sheet for  Patients: EntrepreneurPulse.com.au  Fact Sheet for Healthcare Providers: IncredibleEmployment.be  This test is not yet approved or cleared by the [place] FDA and has been authorized for detection and/or diagnosis of SARS-CoV-2 by FDA under an Emergency Use Authorization (EUA). This EUA will remain in effect (meaning this test can be used) for the duration of the COVID-19 declaration under Section 564(b)(1) of the Act, 21 U.S.C. section 360bbb-3(b)(1), unless the authorization is terminated or revoked.  Performed at [facility], [place] [address] [place], [place] [address]      Radiological Exams on Admission: DG Chest 2 View  Result Date: 05/12/2020 CLINICAL DATA:  Chest pain short of breath EXAM: CHEST - 2 VIEW COMPARISON:  04/27/2020 FINDINGS: Mild cardiomegaly. No focal opacity or pleural effusion. No pneumothorax. IMPRESSION: Mild cardiomegaly. Electronically Signed   By: Donavan Foil M.D.   On: 05/12/2020 23:26   CT Angio Chest PE W and/or Wo Contrast  Result Date: 05/13/2020 CLINICAL DATA:  Bilateral leg swelling and pain elevated D-dimer EXAM: CT ANGIOGRAPHY CHEST WITH CONTRAST TECHNIQUE: Multidetector CT imaging of the chest was performed using the standard protocol during bolus administration of intravenous contrast. Multiplanar CT image reconstructions and MIPs were obtained to evaluate the vascular anatomy. CONTRAST:  145mL OMNIPAQUE IOHEXOL 350 MG/ML SOLN COMPARISON:  Chest x-ray 05/12/2020 FINDINGS: Cardiovascular: Satisfactory opacification of the pulmonary arteries to the segmental level. No evidence of pulmonary embolism. Dilated pulmonary trunk measuring up to 3.7 cm. Nonaneurysmal aorta. No dissection seen. Cardiomegaly. No significant pericardial effusion Mediastinum/Nodes: No enlarged mediastinal, hilar, or axillary lymph nodes. Thyroid gland, trachea, and esophagus demonstrate no significant findings.  Lungs/Pleura: Hazy pulmonary densities bilaterally. No pleural effusion or pneumothorax Upper Abdomen: No acute abnormality. Musculoskeletal: No chest wall abnormality. No acute or significant osseous findings. Review of the MIP images confirms the above findings. IMPRESSION: 1. Negative for acute pulmonary embolus or aortic dissection. 2. Cardiomegaly. Dilated pulmonary trunk, can be seen with pulmonary arterial hypertension. 3. Hazy pulmonary densities which could be secondary to mild edema or potential small airways disease. Electronically Signed   By: Donavan Foil M.D.   On: 05/13/2020 01:04    EKG: Independently reviewed.  Normal sinus rhythm.  Assessment/Plan Principal Problem:   Acute CHF (congestive heart failure) (HCC) Active Problems:   Hypertensive urgency    1. Acute CHF with possible pulmonary hypertension for which at this time patient has been placed on Lasix 40 mg IV every 12 will follow intake output metabolic panel.  Will check 2D echo. 2. Hypertensive urgency likely contributing to patient's symptoms presently on amlodipine and metoprolol and  chlorthalidone.  We will keep patient on as needed IV hydralazine patient is also on Lasix follow blood pressure trends.   DVT prophylaxis: Lovenox. Code Status: Full code. Family Communication: Discussed with patient. Disposition Plan: Home. Consults called: None. Admission status: Observation.   Rise Patience MD [facility] Pager [phone].  If 7PM-7AM, please contact night-coverage [email or web address] Password [facility]  05/13/2020, 3:40 AM

## 2020-05-13 NOTE — ED Notes (Signed)
Pt ambulated to restroom and oxygen saturation dropped down to 86%. Pt was complaining of tiredness and stopped both to and from the restroom to catch breath.

## 2020-05-13 NOTE — ED Provider Notes (Signed)
00:15: Assumed care of patient from Dr. Sherry Ruffing @ change of shift pending CTA & admission.   Please see prior provider note for full H&P.  Briefly patient is a 45 yo female with a hx of hypertension & diabetes mellitus who presented to the ED with complaints of bilateral LE swelling with dyspnea & orthopnea. Seen recently in the ED with increase in Lasix w/o improvement.   Today she is hypoxic to mid to upper 80s with ambulation.  Positive D-dimer.  Plan is to follow up on CTA & admit.   CTA negative for PE  1. Negative for acute pulmonary embolus or aortic dissection. 2. Cardiomegaly. Dilated pulmonary trunk, can be seen with pulmonary arterial hypertension. 3. Hazy pulmonary densities which could be secondary to mild edema or potential small airways disease  Will give IV lasix & discuss with hospitliast service.   01:35: CONSULT: Discussed with hospitalist Dr. Hal Hope- accepts admission.    Results for orders placed or performed during the hospital encounter of 05/12/20  Resp Panel by RT-PCR (Flu A&B, Covid) Nasopharyngeal Swab   Specimen: Nasopharyngeal Swab; Nasopharyngeal(NP) swabs in vial transport medium  Result Value Ref Range   SARS Coronavirus 2 by RT PCR NEGATIVE NEGATIVE   Influenza A by PCR NEGATIVE NEGATIVE   Influenza B by PCR NEGATIVE NEGATIVE  CBC with Differential  Result Value Ref Range   WBC 2.4 (L) 4.0 - 10.5 K/uL   RBC 3.57 (L) 3.87 - 5.11 MIL/uL   Hemoglobin 12.3 12.0 - 15.0 g/dL   HCT 38.3 36.0 - 46.0 %   MCV 107.3 (H) 80.0 - 100.0 fL   MCH 34.5 (H) 26.0 - 34.0 pg   MCHC 32.1 30.0 - 36.0 g/dL   RDW 14.0 11.5 - 15.5 %   Platelets 280 150 - 400 K/uL   nRBC 0.0 0.0 - 0.2 %   Neutrophils Relative % 38 %   Neutro Abs 0.9 (L) 1.7 - 7.7 K/uL   Lymphocytes Relative 40 %   Lymphs Abs 1.0 0.7 - 4.0 K/uL   Monocytes Relative 16 %   Monocytes Absolute 0.4 0.1 - 1.0 K/uL   Eosinophils Relative 5 %   Eosinophils Absolute 0.1 0.0 - 0.5 K/uL   Basophils  Relative 1 %   Basophils Absolute 0.0 0.0 - 0.1 K/uL   Immature Granulocytes 0 %   Abs Immature Granulocytes 0.01 0.00 - 0.07 K/uL  Comprehensive metabolic panel  Result Value Ref Range   Sodium 134 (L) 135 - 145 mmol/L   Potassium 3.6 3.5 - 5.1 mmol/L   Chloride 97 (L) 98 - 111 mmol/L   CO2 25 22 - 32 mmol/L   Glucose, Bld 101 (H) 70 - 99 mg/dL   BUN 8 6 - 20 mg/dL   Creatinine, Ser 0.78 0.44 - 1.00 mg/dL   Calcium 9.1 8.9 - 10.3 mg/dL   Total Protein 8.6 (H) 6.5 - 8.1 g/dL   Albumin 3.7 3.5 - 5.0 g/dL   AST 155 (H) 15 - 41 U/L   ALT 83 (H) 0 - 44 U/L   Alkaline Phosphatase 83 38 - 126 U/L   Total Bilirubin 0.8 0.3 - 1.2 mg/dL   GFR, Estimated >60 >60 mL/min   Anion gap 12 5 - 15  Brain natriuretic peptide  Result Value Ref Range   B Natriuretic Peptide 49.4 0.0 - 100.0 pg/mL  D-dimer, quantitative  Result Value Ref Range   D-Dimer, Quant 0.51 (H) 0.00 - 0.50 ug/mL-FEU  Urinalysis, Routine  w reflex microscopic  Result Value Ref Range   Color, Urine YELLOW YELLOW   APPearance CLEAR CLEAR   Specific Gravity, Urine 1.006 1.005 - 1.030   pH 7.0 5.0 - 8.0   Glucose, UA NEGATIVE NEGATIVE mg/dL   Hgb urine dipstick NEGATIVE NEGATIVE   Bilirubin Urine NEGATIVE NEGATIVE   Ketones, ur NEGATIVE NEGATIVE mg/dL   Protein, ur NEGATIVE NEGATIVE mg/dL   Nitrite NEGATIVE NEGATIVE   Leukocytes,Ua NEGATIVE NEGATIVE  I-Stat Beta hCG blood, ED ([facility], [facility], [facility] only)  Result Value Ref Range   I-stat hCG, quantitative <5.0 <5 mIU/mL   Comment 3          Troponin I (High Sensitivity)  Result Value Ref Range   Troponin I (High Sensitivity) 3 <18 ng/L   DG Chest 2 View  Result Date: 05/12/2020 CLINICAL DATA:  Chest pain short of breath EXAM: CHEST - 2 VIEW COMPARISON:  04/27/2020 FINDINGS: Mild cardiomegaly. No focal opacity or pleural effusion. No pneumothorax. IMPRESSION: Mild cardiomegaly. Electronically Signed   By: Donavan Foil M.D.   On: 05/12/2020 23:26   CT Angio Chest PE W and/or Wo  Contrast  Result Date: 05/13/2020 CLINICAL DATA:  Bilateral leg swelling and pain elevated D-dimer EXAM: CT ANGIOGRAPHY CHEST WITH CONTRAST TECHNIQUE: Multidetector CT imaging of the chest was performed using the standard protocol during bolus administration of intravenous contrast. Multiplanar CT image reconstructions and MIPs were obtained to evaluate the vascular anatomy. CONTRAST:  164mL OMNIPAQUE IOHEXOL 350 MG/ML SOLN COMPARISON:  Chest x-ray 05/12/2020 FINDINGS: Cardiovascular: Satisfactory opacification of the pulmonary arteries to the segmental level. No evidence of pulmonary embolism. Dilated pulmonary trunk measuring up to 3.7 cm. Nonaneurysmal aorta. No dissection seen. Cardiomegaly. No significant pericardial effusion Mediastinum/Nodes: No enlarged mediastinal, hilar, or axillary lymph nodes. Thyroid gland, trachea, and esophagus demonstrate no significant findings. Lungs/Pleura: Hazy pulmonary densities bilaterally. No pleural effusion or pneumothorax Upper Abdomen: No acute abnormality. Musculoskeletal: No chest wall abnormality. No acute or significant osseous findings. Review of the MIP images confirms the above findings. IMPRESSION: 1. Negative for acute pulmonary embolus or aortic dissection. 2. Cardiomegaly. Dilated pulmonary trunk, can be seen with pulmonary arterial hypertension. 3. Hazy pulmonary densities which could be secondary to mild edema or potential small airways disease. Electronically Signed   By: Donavan Foil M.D.   On: 05/13/2020 01:04   DG Chest Portable 1 View  Result Date: 04/27/2020 CLINICAL DATA:  Bilateral lower extremity edema for several weeks EXAM: PORTABLE CHEST 1 VIEW COMPARISON:  02/09/2016 FINDINGS: Frontal view of the chest was obtained, limited by portable technique and body habitus. Cardiac silhouette is enlarged. No airspace disease, effusion, or pneumothorax. No acute bony abnormalities. IMPRESSION: 1. Enlarged cardiac silhouette. 2. No acute airspace  disease. Electronically Signed   By: Randa Ngo M.D.   On: 04/27/2020 20:36   US Abdomen Limited RUQ (LIVER/GB)  Result Date: 04/27/2020 CLINICAL DATA:  Elevated LFTs EXAM: ULTRASOUND ABDOMEN LIMITED RIGHT UPPER QUADRANT COMPARISON:  None. FINDINGS: Gallbladder: No gallstones or wall thickening visualized. No sonographic Murphy sign noted by sonographer. Common bile duct: Diameter: 3.8 mm Liver: Mild hepatomegaly with increased echogenicity seen throughout. No focal hepatic lesion is seen prior portal vein is patent on color Doppler imaging with normal direction of blood flow towards the liver. Other: None. IMPRESSION: Normal appearing gallbladder. Hepatic steatosis and mild hepatomegaly Electronically Signed   By: Prudencio Pair M.D.   On: 04/27/2020 22:07       Kyra Laffey, Glynda Jaeger, PA-C  05/13/20 [place], Gwenyth Allegra, MD 05/14/20 [phone]

## 2020-05-13 NOTE — Progress Notes (Signed)
  Echocardiogram 2D Echocardiogram has been performed.  Jennette Dubin 05/13/2020, 4:15 PM

## 2020-05-13 NOTE — Progress Notes (Signed)
PROGRESS NOTE  Hannah Orr QVZ:[id number] DOB: 06-26-1975   PCP: Katherina Mires, MD  Patient is from: Home  DOA: 05/12/2020 LOS: 0  Chief complaints: Leg swelling  Brief Narrative / Interim history: 45 year old F with PMH of HTN and morbid obesity presented to ED with progressive lower extremity edema that did not improve with oral diuretics, and admitted for acute CHF and uncontrolled hypertension.  CTA negative for PE but concern for cardiomegaly and pulmonary vascular congestion.  Started on IV Lasix.  Subjective: Seen and examined earlier this morning.  No major events overnight of this morning.  She denies chest pain, dyspnea, GI or UTI symptoms.  Reports lower extremity edema and dyspnea on exertion.  She also have chronic orthopnea unchanged from baseline.  Recently started using cane for ambulation due to edema.  Objective: Vitals:   05/13/20 0645 05/13/20 0700 05/13/20 0945 05/13/20 0946  BP:   (!) 157/87 (!) 157/87  Pulse: 98 (!) 109 92 92  Resp: (!) 21 (!) 23 (!) 26 (!) 26  Temp:      TempSrc:      SpO2: 91% 90% 100% 100%   No intake or output data in the 24 hours ending 05/13/20 1252 There were no vitals filed for this visit.  Examination:  GENERAL: No apparent distress.  Nontoxic. HEENT: MMM.  Vision and hearing grossly intact.  NECK: Supple.  Difficult to assess JVD due to body habitus. RESP: On RA.  No IWOB.  Fair aeration bilaterally. CVS:  RRR. Heart sounds normal.  ABD/GI/GU: BS+. Abd soft, NTND.  MSK/EXT:  Moves extremities. No apparent deformity.  2+ pitting BLE edema.  SKIN: no apparent skin lesion or wound NEURO: Awake, alert and oriented appropriately.  No apparent focal neuro deficit. PSYCH: Calm. Normal affect.  Procedures:  None  Microbiology summarized: [id number] and influenza PCR nonreactive.  Assessment & Plan: Acute CHF/possible pulmonary HTN: new diagnosis.  Presents with progressive edema and DOE despite p.o. diuretics at home.   2+ pitting edema bilaterally.  She is at risk for sleep apnea -Continue IV Lasix 40 mg twice daily. -Discontinue amlodipine in the setting of CHF and edema. -Monitor fluid status, renal functions and electrolytes -Encourage leg elevation.  Patient did not tolerate compression socks in the past -Follow TTE.  Uncontrolled hypertension: SBP as high as 190s.  DBP as high as 112.  Improved. -IV Lasix as above -Change metoprolol to Coreg for better BP control -Discontinue amlodipine in setting of CHF and edema. -Hold chlorthalidone while on Lasix  Elevated liver enzymes: Pattern consistent with EtOH or rhabdo.  However, patient denies alcohol in the last 6 months.  CK within normal.  Due to CHF? -Continue monitoring -Further work-up if no improvement  Leukopenia: Seems to be chronic. -Check anemia panel  Hypokalemia: K3.3.  Mg 1.8.  Likely due to diuretics. -Replenish and recheck -May consider low-dose Aldactone if no improvement.  Hyperproteinemia: Total protein 9.0.  Albumin 3.8. -Check multiple myeloma panel -Check urinalysis  Anxiety and depression: Stable. -Continue Prozac  Morbid obesity -Check BMI       DVT prophylaxis:  Subcu Lovenox  Code Status: Full code Family Communication: Patient and/or RN. Available if any question.  Level of care: Telemetry Status is: Observation  The patient will require care spanning > 2 midnights and should be moved to inpatient because: Persistent severe electrolyte disturbances, Ongoing diagnostic testing needed not appropriate for outpatient work up, IV treatments appropriate due to intensity of illness or inability to  take PO and Inpatient level of care appropriate due to severity of illness  Dispo: The patient is from: Home              Anticipated d/c is to: Home              Patient currently is not medically stable to d/c.   Difficult to place patient No       Consultants:  None   Sch Meds:  Scheduled Meds: .  amLODipine  10 mg Oral Daily  . carvedilol  12.5 mg Oral BID WC  . enoxaparin (LOVENOX) injection  80 mg Subcutaneous Q24H  . FLUoxetine  40 mg Oral Daily  . furosemide  40 mg Intravenous Q12H  . gabapentin  100 mg Oral TID  . methylPREDNISolone acetate  80 mg Intramuscular Once  . potassium chloride  40 mEq Oral Q4H   Continuous Infusions: PRN Meds:.acetaminophen **OR** acetaminophen, albuterol, hydrALAZINE  Antimicrobials: Anti-infectives (From admission, onward)   None       I have personally reviewed the following labs and images: CBC: Recent Labs  Lab 05/12/20 2241 05/13/20 0410  WBC 2.4* 2.0*  NEUTROABS 0.9* 0.8*  HGB 12.3 12.2  HCT 38.3 36.7  MCV 107.3* 106.7*  PLT 280 271   BMP &GFR Recent Labs  Lab 05/12/20 2241 05/13/20 0410  NA 134* 135  K 3.6 3.3*  CL 97* 96*  CO2 25 24  GLUCOSE 101* 95  BUN 8 7  CREATININE 0.78 0.68  CALCIUM 9.1 8.8*  MG  --  1.8   CrCl cannot be calculated (Unknown ideal weight.). Liver & Pancreas: Recent Labs  Lab 05/12/20 2241 05/13/20 0410  AST 155* 196*  ALT 83* 94*  ALKPHOS 83 87  BILITOT 0.8 1.0  PROT 8.6* 9.0*  ALBUMIN 3.7 3.8   No results for input(s): LIPASE, AMYLASE in the last 168 hours. No results for input(s): AMMONIA in the last 168 hours. Diabetic: No results for input(s): HGBA1C in the last 72 hours. No results for input(s): GLUCAP in the last 168 hours. Cardiac Enzymes: Recent Labs  Lab 05/13/20 0410  CKTOTAL 53   No results for input(s): PROBNP in the last 8760 hours. Coagulation Profile: No results for input(s): INR, PROTIME in the last 168 hours. Thyroid Function Tests: Recent Labs    05/13/20 0339  TSH 6.599*   Lipid Profile: No results for input(s): CHOL, HDL, LDLCALC, TRIG, CHOLHDL, LDLDIRECT in the last 72 hours. Anemia Panel: No results for input(s): VITAMINB12, FOLATE, FERRITIN, TIBC, IRON, RETICCTPCT in the last 72 hours. Urine analysis:    Component Value Date/Time    COLORURINE YELLOW 05/12/2020 2243   [place] 05/12/2020 2243   LABSPEC 1.006 05/12/2020 2243   PHURINE 7.0 05/12/2020 [place] 05/12/2020 2243   GLUCOSEU NEGATIVE 11/24/2013 1201   [place] 05/12/2020 2243   BILIRUBINUR NEGATIVE 05/12/2020 2243   KETONESUR NEGATIVE 05/12/2020 2243   PROTEINUR NEGATIVE 05/12/2020 2243   UROBILINOGEN 1.0 11/24/2013 1201   NITRITE NEGATIVE 05/12/2020 2243   LEUKOCYTESUR NEGATIVE 05/12/2020 2243   Sepsis Labs: Invalid input(s): PROCALCITONIN, [place]  Microbiology: Recent Results (from the past 240 hour(s))  Resp Panel by RT-PCR (Flu A&B, Covid) Nasopharyngeal Swab     Status: None   Collection Time: 05/12/20 10:42 PM   Specimen: Nasopharyngeal Swab; Nasopharyngeal(NP) swabs in vial transport medium  Result Value Ref Range Status   SARS Coronavirus 2 by RT PCR NEGATIVE NEGATIVE Final    Comment: (  NOTE) SARS-CoV-2 target nucleic acids are NOT DETECTED.  The SARS-CoV-2 RNA is generally detectable in upper respiratory specimens during the acute phase of infection. The lowest concentration of SARS-CoV-2 viral copies this assay can detect is 138 copies/mL. A negative result does not preclude SARS-Cov-2 infection and should not be used as the sole basis for treatment or other patient management decisions. A negative result may occur with  improper specimen collection/handling, submission of specimen other than nasopharyngeal swab, presence of viral mutation(s) within the areas targeted by this assay, and inadequate number of viral copies(<138 copies/mL). A negative result must be combined with clinical observations, patient history, and epidemiological information. The expected result is Negative.  Fact Sheet for Patients:  EntrepreneurPulse.com.au  Fact Sheet for Healthcare Providers:  IncredibleEmployment.be  This test is no t yet approved or cleared by the [place] FDA  and  has been authorized for detection and/or diagnosis of SARS-CoV-2 by FDA under an Emergency Use Authorization (EUA). This EUA will remain  in effect (meaning this test can be used) for the duration of the COVID-19 declaration under Section 564(b)(1) of the Act, 21 U.S.C.section 360bbb-3(b)(1), unless the authorization is terminated  or revoked sooner.       Influenza A by PCR NEGATIVE NEGATIVE Final   Influenza B by PCR NEGATIVE NEGATIVE Final    Comment: (NOTE) The Xpert Xpress SARS-CoV-2/FLU/RSV plus assay is intended as an aid in the diagnosis of influenza from Nasopharyngeal swab specimens and should not be used as a sole basis for treatment. Nasal washings and aspirates are unacceptable for Xpert Xpress SARS-CoV-2/FLU/RSV testing.  Fact Sheet for Patients: EntrepreneurPulse.com.au  Fact Sheet for Healthcare Providers: IncredibleEmployment.be  This test is not yet approved or cleared by the [place] FDA and has been authorized for detection and/or diagnosis of SARS-CoV-2 by FDA under an Emergency Use Authorization (EUA). This EUA will remain in effect (meaning this test can be used) for the duration of the COVID-19 declaration under Section 564(b)(1) of the Act, 21 U.S.C. section 360bbb-3(b)(1), unless the authorization is terminated or revoked.  Performed at [facility], [place] [address] [place], [place] [address]     Radiology Studies: DG Chest 2 View  Result Date: 05/12/2020 CLINICAL DATA:  Chest pain short of breath EXAM: CHEST - 2 VIEW COMPARISON:  04/27/2020 FINDINGS: Mild cardiomegaly. No focal opacity or pleural effusion. No pneumothorax. IMPRESSION: Mild cardiomegaly. Electronically Signed   By: Donavan Foil M.D.   On: 05/12/2020 23:26   CT Angio Chest PE W and/or Wo Contrast  Result Date: 05/13/2020 CLINICAL DATA:  Bilateral leg swelling and pain elevated D-dimer EXAM: CT ANGIOGRAPHY CHEST WITH  CONTRAST TECHNIQUE: Multidetector CT imaging of the chest was performed using the standard protocol during bolus administration of intravenous contrast. Multiplanar CT image reconstructions and MIPs were obtained to evaluate the vascular anatomy. CONTRAST:  160m OMNIPAQUE IOHEXOL 350 MG/ML SOLN COMPARISON:  Chest x-ray 05/12/2020 FINDINGS: Cardiovascular: Satisfactory opacification of the pulmonary arteries to the segmental level. No evidence of pulmonary embolism. Dilated pulmonary trunk measuring up to 3.7 cm. Nonaneurysmal aorta. No dissection seen. Cardiomegaly. No significant pericardial effusion Mediastinum/Nodes: No enlarged mediastinal, hilar, or axillary lymph nodes. Thyroid gland, trachea, and esophagus demonstrate no significant findings. Lungs/Pleura: Hazy pulmonary densities bilaterally. No pleural effusion or pneumothorax Upper Abdomen: No acute abnormality. Musculoskeletal: No chest wall abnormality. No acute or significant osseous findings. Review of the MIP images confirms the above findings. IMPRESSION: 1. Negative for acute pulmonary embolus or aortic  dissection. 2. Cardiomegaly. Dilated pulmonary trunk, can be seen with pulmonary arterial hypertension. 3. Hazy pulmonary densities which could be secondary to mild edema or potential small airways disease. Electronically Signed   By: Donavan Foil M.D.   On: 05/13/2020 01:04   VAS [place] LOWER EXTREMITY VENOUS (DVT)  Result Date: 05/13/2020  Lower Venous DVT Study Indications: Increased BLE edema depsite Lasix treatment and increased SOB and hypoxia with exertion.  Comparison Study: No previous exams Performing Technologist: Rogelia Rohrer  Examination Guidelines: A complete evaluation includes B-mode imaging, spectral Doppler, color Doppler, and power Doppler as needed of all accessible portions of each vessel. Bilateral testing is considered an integral part of a complete examination. Limited examinations for reoccurring indications may be performed  as noted. The reflux portion of the exam is performed with the patient in reverse Trendelenburg.  +---------+---------------+---------+-----------+----------+--------------+ RIGHT    CompressibilityPhasicitySpontaneityPropertiesThrombus Aging +---------+---------------+---------+-----------+----------+--------------+ CFV      Full           Yes      Yes                                 +---------+---------------+---------+-----------+----------+--------------+ SFJ      Full                                                        +---------+---------------+---------+-----------+----------+--------------+ FV Prox  Full           Yes      Yes                                 +---------+---------------+---------+-----------+----------+--------------+ FV Mid   Full           Yes      Yes                                 +---------+---------------+---------+-----------+----------+--------------+ FV DistalFull           Yes      Yes                                 +---------+---------------+---------+-----------+----------+--------------+ PFV      Full                                                        +---------+---------------+---------+-----------+----------+--------------+ POP      Full           Yes      Yes                                 +---------+---------------+---------+-----------+----------+--------------+ PTV      Full                                                        +---------+---------------+---------+-----------+----------+--------------+  PERO     Full                                                        +---------+---------------+---------+-----------+----------+--------------+   +---------+---------------+---------+-----------+----------+--------------+ LEFT     CompressibilityPhasicitySpontaneityPropertiesThrombus Aging +---------+---------------+---------+-----------+----------+--------------+ CFV      Full            Yes      Yes                                 +---------+---------------+---------+-----------+----------+--------------+ SFJ      Full                                                        +---------+---------------+---------+-----------+----------+--------------+ FV Prox  Full           Yes      Yes                                 +---------+---------------+---------+-----------+----------+--------------+ FV Mid   Full           Yes      Yes                                 +---------+---------------+---------+-----------+----------+--------------+ FV DistalFull           Yes      Yes                                 +---------+---------------+---------+-----------+----------+--------------+ PFV      Full                                                        +---------+---------------+---------+-----------+----------+--------------+ POP      Full           Yes      Yes                                 +---------+---------------+---------+-----------+----------+--------------+ PTV      Full                                                        +---------+---------------+---------+-----------+----------+--------------+ PERO     Full                                                        +---------+---------------+---------+-----------+----------+--------------+  Summary: BILATERAL: - No evidence of deep vein thrombosis seen in the lower extremities, bilaterally. - No evidence of superficial venous thrombosis in the lower extremities, bilaterally. -No evidence of popliteal cyst, bilaterally.   *See table(s) above for measurements and observations.    Preliminary       Taye T. Pierce  If 7PM-7AM, please contact night-coverage [email or web address] 05/13/2020, 12:52 PM

## 2020-05-13 NOTE — ED Notes (Signed)
Pt ambulated to bathroom with no assistance.  

## 2020-05-13 NOTE — ED Notes (Signed)
Breakfast tray delivered

## 2020-05-13 NOTE — Plan of Care (Signed)

## 2020-05-14 DIAGNOSIS — E538 Deficiency of other specified B group vitamins: Secondary | ICD-10-CM

## 2020-05-14 DIAGNOSIS — R0600 Dyspnea, unspecified: Secondary | ICD-10-CM

## 2020-05-14 DIAGNOSIS — D709 Neutropenia, unspecified: Secondary | ICD-10-CM

## 2020-05-14 DIAGNOSIS — E559 Vitamin D deficiency, unspecified: Secondary | ICD-10-CM

## 2020-05-14 DIAGNOSIS — R600 Localized edema: Secondary | ICD-10-CM

## 2020-05-14 DIAGNOSIS — Z6841 Body Mass Index (BMI) 40.0 and over, adult: Secondary | ICD-10-CM

## 2020-05-14 LAB — CBC
HCT: 39 % (ref 36.0–46.0)
Hemoglobin: 12.9 g/dL (ref 12.0–15.0)
MCH: 35.1 pg — ABNORMAL HIGH (ref 26.0–34.0)
MCHC: 33.1 g/dL (ref 30.0–36.0)
MCV: 106 fL — ABNORMAL HIGH (ref 80.0–100.0)
Platelets: 268 10*3/uL (ref 150–400)
RBC: 3.68 MIL/uL — ABNORMAL LOW (ref 3.87–5.11)
RDW: 14.2 % (ref 11.5–15.5)
WBC: 1.8 10*3/uL — ABNORMAL LOW (ref 4.0–10.5)
nRBC: 0 % (ref 0.0–0.2)

## 2020-05-14 LAB — COMPREHENSIVE METABOLIC PANEL
ALT: 84 U/L — ABNORMAL HIGH (ref 0–44)
AST: 168 U/L — ABNORMAL HIGH (ref 15–41)
Albumin: 3.6 g/dL (ref 3.5–5.0)
Alkaline Phosphatase: 79 U/L (ref 38–126)
Anion gap: 15 (ref 5–15)
BUN: 10 mg/dL (ref 6–20)
CO2: 26 mmol/L (ref 22–32)
Calcium: 8.9 mg/dL (ref 8.9–10.3)
Chloride: 94 mmol/L — ABNORMAL LOW (ref 98–111)
Creatinine, Ser: 0.73 mg/dL (ref 0.44–1.00)
GFR, Estimated: >60 mL/min (ref >60)
Glucose, Bld: 97 mg/dL (ref 70–99)
Potassium: 3.2 mmol/L — ABNORMAL LOW (ref 3.5–5.1)
Sodium: 135 mmol/L (ref 135–145)
Total Bilirubin: 1.7 mg/dL — ABNORMAL HIGH (ref 0.3–1.2)
Total Protein: 8.4 g/dL — ABNORMAL HIGH (ref 6.5–8.1)

## 2020-05-14 LAB — URINE CULTURE: Culture: <10000 — AB

## 2020-05-14 LAB — T4, FREE: Free T4: 0.89 ng/dL (ref 0.61–1.12)

## 2020-05-14 LAB — MAGNESIUM: Magnesium: 1.9 mg/dL (ref 1.7–2.4)

## 2020-05-14 LAB — VITAMIN D 25 HYDROXY (VIT D DEFICIENCY, FRACTURES): Vit D, 25-Hydroxy: 21.37 ng/mL — ABNORMAL LOW (ref 30–100)

## 2020-05-14 LAB — PHOSPHORUS: Phosphorus: 4.6 mg/dL (ref 2.5–4.6)

## 2020-05-14 MED ORDER — CARVEDILOL 12.5 MG PO TABS: 12.5000 mg | ORAL_TABLET | Freq: Two times a day (BID) | ORAL | 1 refills | Status: DC

## 2020-05-14 MED ORDER — FUROSEMIDE 40 MG PO TABS: 40.0000 mg | ORAL_TABLET | Freq: Every day | ORAL | 1 refills | Status: DC

## 2020-05-14 MED ORDER — POTASSIUM CHLORIDE CRYS ER 20 MEQ PO TBCR
40.0000 meq | EXTENDED_RELEASE_TABLET | ORAL | Status: AC
  Administered 2020-05-14 (×2): 40 meq via ORAL
  Filled 2020-05-14 (×2): qty 2

## 2020-05-14 MED ORDER — FOLIC ACID 1 MG PO TABS: 1.0000 mg | ORAL_TABLET | Freq: Every day | ORAL | 1 refills | Status: DC

## 2020-05-14 MED ORDER — VITAMIN D3 50 MCG (2000 UT) PO CAPS: 2000.0000 [IU] | ORAL_CAPSULE | Freq: Every day | ORAL | 0 refills | Status: DC

## 2020-05-14 MED ORDER — VITAMIN B-12 1000 MCG PO TABS: 1000.0000 ug | ORAL_TABLET | Freq: Every day | ORAL | 1 refills | Status: DC

## 2020-05-14 NOTE — Discharge Summary (Signed)
Physician Discharge Summary  Hannah Orr EGB:[id number] DOB: November 29, 1975 DOA: 05/12/2020  PCP: Katherina Mires, MD  Admit date: 05/12/2020 Discharge date: 05/14/2020  Admitted From: Home Disposition: Home  Recommendations for Outpatient Follow-up:  1. Follow ups as below. 2. Please obtain CBC/BMP/Mag at follow up 3. Please follow up on the following pending results: Multiple myeloma panel  Home Health: None Equipment/Devices: None  Discharge Condition: Stable CODE STATUS: Full code   Hospital Course: 45 year old F with PMH of HTN and morbid obesity presented to ED with progressive lower extremity edema that did not improve with oral diuretics. CTA negative for PE but concern for cardiomegaly and pulmonary vascular congestion.   Troponin and BNP were within normal.  She was a started on IV Lasix and admitted for presumed acute CHF and uncontrolled hypertension.    Patient diuresed well with IV Lasix.  Echocardiogram was basically normal without systolic or diastolic dysfunction making CHF unlikely.  Blood pressure improved.  She was discharged on p.o. Lasix.  Home amlodipine discontinued and replaced by Coreg due to lower extremity edema.    Of note, patient has chronic leukopenia with neutropenia and slightly elevated serum protein level.  Anemia panel showed vitamin B12 deficiency and borderline folate deficiency.  Started on injectable vitamin B12 and discharged on p.o. B12 and folate.  She could benefit from monthly vitamin B12 injection.  She was also encouraged to take vitamin D over-the-counter for vitamin D insufficiency.  Multiple myeloma panel was pending at the time of discharge.  Ambulatory referral to hematology ordered.  See individual problem list below for more on hospital course.  Discharge Diagnoses:  Dyspnea on exertion/edema:  CTA suggested some degree of active CHF but TTE rules out CHF.  Edema could be due to amlodipine and some degree of venous insufficiency.   She is not anemic.  TSH slightly elevated but free T4 within normal. -Discontinued amlodipine.  Changed metoprolol to Coreg for better blood pressure control -Discharged on p.o. Lasix 40 mg daily with an option to take additional 40 mg as needed -Discontinued chlorthalidone while on Lasix. -Encourage leg elevation, compression socks, fluid and salt restriction -Consider sleep study to exclude sleep apnea  Uncontrolled hypertension: Normotensive at time of discharge.  Noncompliance? -Adjusted cardiac meds as above -Consider sleep study  Elevated liver enzymes: Pattern consistent with EtOH or rhabdo.  However, patient denies alcohol in the last 6 months.  CK within normal.   Improved.  -Recheck CMP at follow-up  Leukopenia: Seems to be chronic. -Ambulatory referral to hematology  Vitamin B12 deficiency/folate deficiency -Started on injectable vitamin B12 and discharged on p.o. vitamin 45 and folic acid -Could benefit from monthly vitamin B12 injection  Vitamin D insufficiency -Recommended taking vitamin D3 2000 units daily  Hypokalemia: Replenished prior to discharge  Hyperproteinemia: Total protein 9.0> 8.4.  Albumin 3.8> 3.4. -Follow multiple myeloma panel although less likely  Anxiety and depression: Stable. -Continue Prozac  Morbid obesity: Could be contributing to patient's DOE. Body mass index is 56.4 kg/m.  -Encourage lifestyle change to lose weight -Consider bariatric surgery          Discharge Exam: Vitals:   05/14/20 0110 05/14/20 0923  BP: (!) 143/79 123/66  Pulse: 93 83  Resp: 20 17  Temp: 98.4 F (36.9 C) 97.9 F (36.6 C)  SpO2: 95% 93%    GENERAL: No apparent distress.  Nontoxic. HEENT: MMM.  Vision and hearing grossly intact.  NECK: Supple.  No apparent JVD.  RESP:  On RA.  No IWOB.  Fair aeration bilaterally. CVS:  RRR. Heart sounds normal.  ABD/GI/GU: Bowel sounds present. Soft. Non tender.  MSK/EXT:  Moves extremities. No apparent  deformity.  1+ pitting edema bilaterally. SKIN: no apparent skin lesion or wound NEURO: Awake, alert and oriented appropriately.  No apparent focal neuro deficit. PSYCH: Calm. Normal affect.  Discharge Instructions  Discharge Instructions    (HEART FAILURE PATIENTS) Call MD:  Anytime you have any of the following symptoms: 1) 3 pound weight gain in 24 hours or 5 pounds in 1 week 2) shortness of breath, with or without a dry hacking cough 3) swelling in the hands, feet or stomach 4) if you have to sleep on extra pillows at night in order to breathe.   Complete by: As directed    Ambulatory referral to Hematology / Oncology   Complete by: As directed    Call MD for:  difficulty breathing, headache or visual disturbances   Complete by: As directed    Call MD for:  extreme fatigue   Complete by: As directed    Call MD for:  persistant dizziness or light-headedness   Complete by: As directed    Diet - low sodium heart healthy   Complete by: As directed    Discharge instructions   Complete by: As directed    It has been a pleasure taking care of you!  You were hospitalized due to leg swelling/edema. This is likely due to venous insufficiency/weak veins and medication such as amlodipine.  We have stopped amlodipine.  We have also changed your fluid medication.  In addition to those, we recommend leg elevation and compression socks.   In addition to taking your medications as prescribed, we also recommend you avoid alcohol or over-the-counter pain medication other than plain Tylenol, limit the amount of water/fluid you drink to less than 6 cups (1500 cc) a day,  limit your sodium (salt) intake to less than 2 g (2000 mg) a day and lifestyle change including diet and exercise to lose weight.     Take care,   Increase activity slowly   Complete by: As directed      Allergies as of 05/14/2020      Reactions   Benazepril Hcl Hypertension   cough   Benazepril Hcl    cough      Medication  List    STOP taking these medications   amLODipine 10 MG tablet Commonly known as: NORVASC   chlorthalidone 25 MG tablet Commonly known as: HYGROTON   metoprolol succinate 50 MG 24 hr tablet Commonly known as: TOPROL-XL     TAKE these medications   albuterol 108 (90 Base) MCG/ACT inhaler Commonly known as: VENTOLIN HFA Inhale 2 puffs into the lungs every 6 (six) hours as needed for wheezing.   carvedilol 12.5 MG tablet Commonly known as: COREG Take 1 tablet (12.5 mg total) by mouth 2 (two) times daily with a meal.   FLUoxetine 40 MG capsule Commonly known as: PROZAC Take 40 mg by mouth daily.   folic acid 1 MG tablet Commonly known as: FOLVITE Take 1 tablet (1 mg total) by mouth daily.   furosemide 40 MG tablet Commonly known as: LASIX Take 1 tablet (40 mg total) by mouth daily. May take additional dose in the afternoon for more swelling or shortness of breath What changed:   medication strength  additional instructions   gabapentin 100 MG capsule Commonly known as: Neurontin Take 1 capsule (100 mg  total) by mouth 3 (three) times daily for 10 days.   potassium chloride 10 MEQ CR capsule Commonly known as: MICRO-K Take 10 mEq by mouth 2 (two) times daily.   vitamin B-12 1000 MCG tablet Commonly known as: CYANOCOBALAMIN Take 1 tablet (1,000 mcg total) by mouth daily.   Vitamin D3 50 MCG (2000 UT) capsule Take 1 capsule (2,000 Units total) by mouth daily.       Consultations: None Procedures/Studies:  2D Echo on 05/13/2020 1. Left ventricular ejection fraction, by estimation, is 60 to 65%. The  left ventricle has normal function. The left ventricle has no regional  wall motion abnormalities. Left ventricular diastolic parameters were  normal.  2. Right ventricular systolic function is low normal. The right  ventricular size is normal.  3. The mitral valve is grossly normal. No evidence of mitral valve  regurgitation.  4. The aortic valve is normal  in structure. Aortic valve regurgitation is  not visualized.    DG Chest 2 View  Result Date: 05/12/2020 CLINICAL DATA:  Chest pain short of breath EXAM: CHEST - 2 VIEW COMPARISON:  04/27/2020 FINDINGS: Mild cardiomegaly. No focal opacity or pleural effusion. No pneumothorax. IMPRESSION: Mild cardiomegaly. Electronically Signed   By: Donavan Foil M.D.   On: 05/12/2020 23:26   CT Angio Chest PE W and/or Wo Contrast  Result Date: 05/13/2020 CLINICAL DATA:  Bilateral leg swelling and pain elevated D-dimer EXAM: CT ANGIOGRAPHY CHEST WITH CONTRAST TECHNIQUE: Multidetector CT imaging of the chest was performed using the standard protocol during bolus administration of intravenous contrast. Multiplanar CT image reconstructions and MIPs were obtained to evaluate the vascular anatomy. CONTRAST:  11mL OMNIPAQUE IOHEXOL 350 MG/ML SOLN COMPARISON:  Chest x-ray 05/12/2020 FINDINGS: Cardiovascular: Satisfactory opacification of the pulmonary arteries to the segmental level. No evidence of pulmonary embolism. Dilated pulmonary trunk measuring up to 3.7 cm. Nonaneurysmal aorta. No dissection seen. Cardiomegaly. No significant pericardial effusion Mediastinum/Nodes: No enlarged mediastinal, hilar, or axillary lymph nodes. Thyroid gland, trachea, and esophagus demonstrate no significant findings. Lungs/Pleura: Hazy pulmonary densities bilaterally. No pleural effusion or pneumothorax Upper Abdomen: No acute abnormality. Musculoskeletal: No chest wall abnormality. No acute or significant osseous findings. Review of the MIP images confirms the above findings. IMPRESSION: 1. Negative for acute pulmonary embolus or aortic dissection. 2. Cardiomegaly. Dilated pulmonary trunk, can be seen with pulmonary arterial hypertension. 3. Hazy pulmonary densities which could be secondary to mild edema or potential small airways disease. Electronically Signed   By: Donavan Foil M.D.   On: 05/13/2020 01:04   DG Chest Portable 1  View  Result Date: 04/27/2020 CLINICAL DATA:  Bilateral lower extremity edema for several weeks EXAM: PORTABLE CHEST 1 VIEW COMPARISON:  02/09/2016 FINDINGS: Frontal view of the chest was obtained, limited by portable technique and body habitus. Cardiac silhouette is enlarged. No airspace disease, effusion, or pneumothorax. No acute bony abnormalities. IMPRESSION: 1. Enlarged cardiac silhouette. 2. No acute airspace disease. Electronically Signed   By: Randa Ngo M.D.   On: 04/27/2020 20:36   ECHOCARDIOGRAM COMPLETE  Result Date: 05/13/2020    ECHOCARDIOGRAM REPORT   Patient Name:   Hannah Orr Date of Exam: 05/13/2020 Medical Rec #:  [id number]         Height:       65.0 in Accession #:    [id number]        Weight:       345.0 lb Date of Birth:  December 14, 1975  BSA:          2.494 m Patient Age:    85 years          BP:           157/90 mmHg Patient Gender: F                 HR:           85 bpm. Exam Location:  Inpatient Procedure: 2D Echo Indications:    Congestive Heart Failure I50.9  History:        Patient has no prior history of Echocardiogram examinations.                 Risk Factors:Hypertension and Diabetes.  Sonographer:    Mikki Santee RDCS (AE) Referring Phys: [place]  1. Left ventricular ejection fraction, by estimation, is 60 to 65%. The left ventricle has normal function. The left ventricle has no regional wall motion abnormalities. Left ventricular diastolic parameters were normal.  2. Right ventricular systolic function is low normal. The right ventricular size is normal.  3. The mitral valve is grossly normal. No evidence of mitral valve regurgitation.  4. The aortic valve is normal in structure. Aortic valve regurgitation is not visualized. FINDINGS  Left Ventricle: Left ventricular ejection fraction, by estimation, is 60 to 65%. The left ventricle has normal function. The left ventricle has no regional wall motion abnormalities. The left  ventricular internal cavity size was normal in size. There is  no left ventricular hypertrophy. Left ventricular diastolic parameters were normal. Right Ventricle: The right ventricular size is normal. Right vetricular wall thickness was not well visualized. Right ventricular systolic function is low normal. Left Atrium: Left atrial size was normal in size. Right Atrium: Right atrial size was normal in size. Pericardium: There is no evidence of pericardial effusion. Mitral Valve: The mitral valve is grossly normal. No evidence of mitral valve regurgitation. Tricuspid Valve: The tricuspid valve is grossly normal. Tricuspid valve regurgitation is trivial. Aortic Valve: The aortic valve is normal in structure. Aortic valve regurgitation is not visualized. Pulmonic Valve: The pulmonic valve was grossly normal. Pulmonic valve regurgitation is not visualized. Aorta: The aortic root and ascending aorta are structurally normal, with no evidence of dilitation. IAS/Shunts: The atrial septum is grossly normal.  LEFT VENTRICLE PLAX 2D LVIDd:         4.45 cm  Diastology LVIDs:         2.97 cm  LV e' medial:    7.61 cm/s LV PW:         0.99 cm  LV E/e' medial:  12.6 LV IVS:        0.95 cm  LV e' lateral:   8.08 cm/s LVOT diam:     1.90 cm  LV E/e' lateral: 11.9 LV SV:         56 LV SV Index:   22 LVOT Area:     2.84 cm  RIGHT VENTRICLE RV S prime:     12.70 cm/s TAPSE (M-mode): 1.6 cm LEFT ATRIUM         Index LA diam:    3.80 cm 1.52 cm/m  AORTIC VALVE LVOT Vmax:   85.80 cm/s LVOT Vmean:  60.500 cm/s LVOT VTI:    0.197 m  AORTA Ao Root diam: 2.60 cm MITRAL VALVE MV Area (PHT): 4.89 cm     SHUNTS MV Decel Time: 155 msec     Systemic VTI:  0.20  m MV E velocity: 96.10 cm/s   Systemic Diam: 1.90 cm MV A velocity: 122.00 cm/s MV E/A ratio:  0.79 Mertie Moores MD Electronically signed by Mertie Moores MD Signature Date/Time: 05/13/2020/4:33:09 PM    Final    VAS [place] LOWER EXTREMITY VENOUS (DVT)  Result Date: 05/13/2020  Lower  Venous DVT Study Indications: Increased BLE edema depsite Lasix treatment and increased SOB and hypoxia with exertion.  Comparison Study: No previous exams Performing Technologist: Rogelia Rohrer  Examination Guidelines: A complete evaluation includes B-mode imaging, spectral Doppler, color Doppler, and power Doppler as needed of all accessible portions of each vessel. Bilateral testing is considered an integral part of a complete examination. Limited examinations for reoccurring indications may be performed as noted. The reflux portion of the exam is performed with the patient in reverse Trendelenburg.  +---------+---------------+---------+-----------+----------+--------------+ RIGHT    CompressibilityPhasicitySpontaneityPropertiesThrombus Aging +---------+---------------+---------+-----------+----------+--------------+ CFV      Full           Yes      Yes                                 +---------+---------------+---------+-----------+----------+--------------+ SFJ      Full                                                        +---------+---------------+---------+-----------+----------+--------------+ FV Prox  Full           Yes      Yes                                 +---------+---------------+---------+-----------+----------+--------------+ FV Mid   Full           Yes      Yes                                 +---------+---------------+---------+-----------+----------+--------------+ FV DistalFull           Yes      Yes                                 +---------+---------------+---------+-----------+----------+--------------+ PFV      Full                                                        +---------+---------------+---------+-----------+----------+--------------+ POP      Full           Yes      Yes                                 +---------+---------------+---------+-----------+----------+--------------+ PTV      Full                                                         +---------+---------------+---------+-----------+----------+--------------+  PERO     Full                                                        +---------+---------------+---------+-----------+----------+--------------+   +---------+---------------+---------+-----------+----------+--------------+ LEFT     CompressibilityPhasicitySpontaneityPropertiesThrombus Aging +---------+---------------+---------+-----------+----------+--------------+ CFV      Full           Yes      Yes                                 +---------+---------------+---------+-----------+----------+--------------+ SFJ      Full                                                        +---------+---------------+---------+-----------+----------+--------------+ FV Prox  Full           Yes      Yes                                 +---------+---------------+---------+-----------+----------+--------------+ FV Mid   Full           Yes      Yes                                 +---------+---------------+---------+-----------+----------+--------------+ FV DistalFull           Yes      Yes                                 +---------+---------------+---------+-----------+----------+--------------+ PFV      Full                                                        +---------+---------------+---------+-----------+----------+--------------+ POP      Full           Yes      Yes                                 +---------+---------------+---------+-----------+----------+--------------+ PTV      Full                                                        +---------+---------------+---------+-----------+----------+--------------+ PERO     Full                                                        +---------+---------------+---------+-----------+----------+--------------+  Summary: BILATERAL: - No evidence of deep vein thrombosis seen in the lower extremities,  bilaterally. - No evidence of superficial venous thrombosis in the lower extremities, bilaterally. -No evidence of popliteal cyst, bilaterally.   *See table(s) above for measurements and observations. Electronically signed by Servando Snare MD on 05/13/2020 at 6:00:34 PM.    Final    US Abdomen Limited RUQ (LIVER/GB)  Result Date: 04/27/2020 CLINICAL DATA:  Elevated LFTs EXAM: ULTRASOUND ABDOMEN LIMITED RIGHT UPPER QUADRANT COMPARISON:  None. FINDINGS: Gallbladder: No gallstones or wall thickening visualized. No sonographic Murphy sign noted by sonographer. Common bile duct: Diameter: 3.8 mm Liver: Mild hepatomegaly with increased echogenicity seen throughout. No focal hepatic lesion is seen prior portal vein is patent on color Doppler imaging with normal direction of blood flow towards the liver. Other: None. IMPRESSION: Normal appearing gallbladder. Hepatic steatosis and mild hepatomegaly Electronically Signed   By: Prudencio Pair M.D.   On: 04/27/2020 22:07        The results of significant diagnostics from this hospitalization (including imaging, microbiology, ancillary and laboratory) are listed below for reference.     Microbiology: Recent Results (from the past 240 hour(s))  Resp Panel by RT-PCR (Flu A&B, Covid) Nasopharyngeal Swab     Status: None   Collection Time: 05/12/20 10:42 PM   Specimen: Nasopharyngeal Swab; Nasopharyngeal(NP) swabs in vial transport medium  Result Value Ref Range Status   SARS Coronavirus 2 by RT PCR NEGATIVE NEGATIVE Final    Comment: (NOTE) SARS-CoV-2 target nucleic acids are NOT DETECTED.  The SARS-CoV-2 RNA is generally detectable in upper respiratory specimens during the acute phase of infection. The lowest concentration of SARS-CoV-2 viral copies this assay can detect is 138 copies/mL. A negative result does not preclude SARS-Cov-2 infection and should not be used as the sole basis for treatment or other patient management decisions. A negative result  may occur with  improper specimen collection/handling, submission of specimen other than nasopharyngeal swab, presence of viral mutation(s) within the areas targeted by this assay, and inadequate number of viral copies(<138 copies/mL). A negative result must be combined with clinical observations, patient history, and epidemiological information. The expected result is Negative.  Fact Sheet for Patients:  EntrepreneurPulse.com.au  Fact Sheet for Healthcare Providers:  IncredibleEmployment.be  This test is no t yet approved or cleared by the [place] FDA and  has been authorized for detection and/or diagnosis of SARS-CoV-2 by FDA under an Emergency Use Authorization (EUA). This EUA will remain  in effect (meaning this test can be used) for the duration of the COVID-19 declaration under Section 564(b)(1) of the Act, 21 U.S.C.section 360bbb-3(b)(1), unless the authorization is terminated  or revoked sooner.       Influenza A by PCR NEGATIVE NEGATIVE Final   Influenza B by PCR NEGATIVE NEGATIVE Final    Comment: (NOTE) The Xpert Xpress SARS-CoV-2/FLU/RSV plus assay is intended as an aid in the diagnosis of influenza from Nasopharyngeal swab specimens and should not be used as a sole basis for treatment. Nasal washings and aspirates are unacceptable for Xpert Xpress SARS-CoV-2/FLU/RSV testing.  Fact Sheet for Patients: EntrepreneurPulse.com.au  Fact Sheet for Healthcare Providers: IncredibleEmployment.be  This test is not yet approved or cleared by the [place] FDA and has been authorized for detection and/or diagnosis of SARS-CoV-2 by FDA under an Emergency Use Authorization (EUA). This EUA will remain in effect (meaning this test can be used) for the duration of the COVID-19 declaration under Section 564(b)(1) of the Act,  21 U.S.C. section 360bbb-3(b)(1), unless the authorization is terminated  or revoked.  Performed at [facility], [place] [address] [place], [place] [address]   Urine culture     Status: Abnormal   Collection Time: 05/12/20 10:43 PM   Specimen: Urine, Clean Catch  Result Value Ref Range Status   Specimen Description   Final    URINE, CLEAN CATCH Performed at [facility], [place] [address], [place], [place] [address]    Special Requests   Final    NONE Performed at [facility], [place] [address] [place], [place] [address]    Culture (A)  Final    <10,000 COLONIES/mL INSIGNIFICANT GROWTH Performed at [place] [address] [place],  [address]    Report Status 05/14/2020 FINAL  Final     Labs:  CBC: Recent Labs  Lab 05/12/20 2241 05/13/20 0410 05/14/20 0910  WBC 2.4* 2.0* 1.8*  NEUTROABS 0.9* 0.8*  --   HGB 12.3 12.2 12.9  HCT 38.3 36.7 39.0  MCV 107.3* 106.7* 106.0*  PLT 280 271 268   BMP &GFR Recent Labs  Lab 05/12/20 2241 05/13/20 0410 05/14/20 0411  NA 134* 135 135  K 3.6 3.3* 3.2*  CL 97* 96* 94*  CO2 $Re'25 24 26  'pTB$ GLUCOSE 101* 95 97  BUN $Re'8 7 10  'ksl$ CREATININE 0.78 0.68 0.73  CALCIUM 9.1 8.8* 8.9  MG  --  1.8 1.9  PHOS  --   --  4.6   Estimated Creatinine Clearance: 135.6 mL/min (by C-G formula based on SCr of 0.73 mg/dL). Liver & Pancreas: Recent Labs  Lab 05/12/20 2241 05/13/20 0410 05/14/20 0411  AST 155* 196* 168*  ALT 83* 94* 84*  ALKPHOS 83 87 79  BILITOT 0.8 1.0 1.7*  PROT 8.6* 9.0* 8.4*  ALBUMIN 3.7 3.8 3.6   No results for input(s): LIPASE, AMYLASE in the last 168 hours. No results for input(s): AMMONIA in the last 168 hours. Diabetic: No results for input(s): HGBA1C in the last 72 hours. No results for input(s): GLUCAP in the last 168 hours. Cardiac Enzymes: Recent Labs  Lab 05/13/20 0410  CKTOTAL 53   No results for input(s): PROBNP in the last 8760 hours. Coagulation Profile: No results for input(s): INR, PROTIME in the last 168  hours. Thyroid Function Tests: Recent Labs    05/13/20 0339 05/14/20 0411  TSH 6.599*  --   FREET4  --  0.89   Lipid Profile: No results for input(s): CHOL, HDL, LDLCALC, TRIG, CHOLHDL, LDLDIRECT in the last 72 hours. Anemia Panel: Recent Labs    05/13/20 1331  VITAMINB12 152*  FOLATE 5.6*  FERRITIN 203  TIBC 439  IRON 68  RETICCTPCT 1.4   Urine analysis:    Component Value Date/Time   COLORURINE YELLOW 05/12/2020 2243   APPEARANCEUR CLEAR 05/12/2020 2243   LABSPEC 1.006 05/12/2020 2243   PHURINE 7.0 05/12/2020 [place] 05/12/2020 2243   GLUCOSEU NEGATIVE 11/24/2013 1201   [place] 05/12/2020 2243   BILIRUBINUR NEGATIVE 05/12/2020 2243   KETONESUR NEGATIVE 05/12/2020 2243   PROTEINUR NEGATIVE 05/12/2020 2243   UROBILINOGEN 1.0 11/24/2013 1201   NITRITE NEGATIVE 05/12/2020 2243   LEUKOCYTESUR NEGATIVE 05/12/2020 2243   Sepsis Labs: Invalid input(s): PROCALCITONIN, LACTICIDVEN   Time coordinating discharge: 35 minutes  SIGNED:  Mercy Riding, MD  [facility] 05/14/2020, 8:51 PM  If 7PM-7AM, please contact night-coverage [email or web address]

## 2020-05-14 NOTE — Plan of Care (Signed)

## 2020-05-14 NOTE — Plan of Care (Signed)
  Problem: Education: Goal: Ability to demonstrate management of disease process will improve Outcome: Progressing Goal: Ability to verbalize understanding of medication therapies will improve Outcome: Progressing   Problem: Activity: Goal: Capacity to carry out activities will improve Outcome: Progressing   Problem: Cardiac: Goal: Ability to achieve and maintain adequate cardiopulmonary perfusion will improve Outcome: Progressing   Problem: Clinical Measurements: Goal: Will remain free from infection Outcome: Progressing Goal: Diagnostic test results will improve Outcome: Progressing Goal: Respiratory complications will improve Outcome: Progressing Goal: Cardiovascular complication will be avoided Outcome: Progressing   Problem: Activity: Goal: Risk for activity intolerance will decrease Outcome: Progressing   Problem: Nutrition: Goal: Adequate nutrition will be maintained Outcome: Progressing   Problem: Pain Managment: Goal: General experience of comfort will improve Outcome: Progressing   Problem: Safety: Goal: Ability to remain free from injury will improve Outcome: Progressing   Problem: Skin Integrity: Goal: Risk for impaired skin integrity will decrease Outcome: Progressing

## 2020-05-17 LAB — MULTIPLE MYELOMA PANEL, SERUM
Albumin SerPl Elph-Mcnc: 3.6 g/dL (ref 2.9–4.4)
Albumin/Glob SerPl: 0.8 (ref 0.7–1.7)
Alpha 1: 0.3 g/dL (ref 0.0–0.4)
Alpha2 Glob SerPl Elph-Mcnc: 1 g/dL (ref 0.4–1.0)
B-Globulin SerPl Elph-Mcnc: 1.2 g/dL (ref 0.7–1.3)
Gamma Glob SerPl Elph-Mcnc: 2.6 g/dL — ABNORMAL HIGH (ref 0.4–1.8)
Globulin, Total: 5 g/dL — ABNORMAL HIGH (ref 2.2–3.9)
IgA: 510 mg/dL — ABNORMAL HIGH (ref 87–352)
IgG (Immunoglobin G), Serum: 2380 mg/dL — ABNORMAL HIGH (ref 586–1602)
IgM (Immunoglobulin M), Srm: 167 mg/dL (ref 26–217)
Total Protein ELP: 8.6 g/dL — ABNORMAL HIGH (ref 6.0–8.5)

## 2020-06-15 ENCOUNTER — Encounter: Payer: Self-pay | Admitting: Pulmonary Disease

## 2020-06-15 ENCOUNTER — Ambulatory Visit (INDEPENDENT_AMBULATORY_CARE_PROVIDER_SITE_OTHER): Payer: BC Managed Care – PPO | Admitting: Pulmonary Disease

## 2020-06-15 ENCOUNTER — Other Ambulatory Visit: Payer: Self-pay

## 2020-06-15 VITALS — BP 148/82 | HR 77 | Temp 97.0°F | Ht 65.0 in | Wt 341.4 lb

## 2020-06-15 DIAGNOSIS — R0600 Dyspnea, unspecified: Secondary | ICD-10-CM

## 2020-06-15 DIAGNOSIS — I2729 Other secondary pulmonary hypertension: Secondary | ICD-10-CM | POA: Diagnosis not present

## 2020-06-15 DIAGNOSIS — G4733 Obstructive sleep apnea (adult) (pediatric): Secondary | ICD-10-CM | POA: Diagnosis not present

## 2020-06-15 DIAGNOSIS — D709 Neutropenia, unspecified: Secondary | ICD-10-CM

## 2020-06-15 DIAGNOSIS — D72819 Decreased white blood cell count, unspecified: Secondary | ICD-10-CM | POA: Insufficient documentation

## 2020-06-15 DIAGNOSIS — R0609 Other forms of dyspnea: Secondary | ICD-10-CM

## 2020-06-15 NOTE — Patient Instructions (Signed)
  Ambulatory saturation. Epworth sleepiness score. Schedule home sleep test  Your shortness of breath is related to weight and fluid retention. Continue Lasix twice daily.  Low blood counts and nerve pain/numbness could be related to B12 deficiency  Defer these issues and disability determination to your primary care provider

## 2020-06-15 NOTE — Assessment & Plan Note (Signed)
B12 deficiency was found and this is being treated. She still complains of leg pains, numbness suggesting neuro pathic etiology, defer to PCP

## 2020-06-15 NOTE — Assessment & Plan Note (Addendum)
She seems to be severely deconditioned and is confirmed by increase of heart rate from 85-1 34 on walking 2 laps, 400 feet.  She is not significantly hypoxic so diuresis is helped. I am sure she has significant restrictive lung disease from obesity but she does not carry a history of asthma and is a never smoker so doubt obstructive lung disease.  I offered PFTs but doubt that this will be helpful. Pulmonary hypertension is possible based on dilated pulmonary trunk noted on CT angiogram but not confirmed on echo.  If further evaluation is desired, she will need a right heart cath and I think we can defer that for now while she is being adequately diuresed.  Interestingly BNP level was low on admit  I explained to her that I do not have a pulmonary diagnosis to take her out of work but clearly she has severe deconditioning, gait imbalance and would be unable to work.  I would defer to her PCP to take her out of work for these reasons till we complete our evaluation

## 2020-06-15 NOTE — Progress Notes (Signed)
Subjective:    Patient ID: Hannah Orr, female    DOB: 1975/04/06, 45 y.o.   MRN: [id number]  HPI   Chief Complaint  Patient presents with  . Consult    Pain right side lower back since february, swelling of bilateral legs, ankles and feet, intermittent productive cough with yellow and sometimes has a little bit of red colored blood   45 year old morbidly obese woman presents for evaluation of abnormal CT scan and OSA/obesity hypoventilation after hospitalization for hypoxia  She reports increasing pedal edema since February 22 and worsening dyspnea on exertion to the point where she cannot walk to her car.  She was hospitalized 3/16-3/18/22 with the symptoms and found to be mildly hypoxic.  CT angiogram chest did not show any evidence of pulm embolism, showed hazy bilateral pulmonary densities, no effusions.  BNP and troponin level was normal, bicarbonate ranged from 21-25, venous duplex was negative for DVT. She was diuresed with IV Lasix and then transition to oral Lasix. Amlodipine was changed to Coreg. She was also found to be leukopenic to 1.8, B12 and folate levels were low, myeloma panel was negative-she was given B12 shots and transition to oral B12  She arrives with her sister today and continues to report shortness of breath.  She is compliant with Lasix twice a day and is urinating well.  Weight is 340 pounds with a BMI of 56.  She works as a Child psychotherapist at [employer], lives with her mother and is currently out on disability. On ambulation oxygen saturation dropped from 99% to 94% on 2 labs, heart rate increased from 85-1 34.  Epworth Sleepiness Scale is 14 and she reports sleepiness while lying down to rest in the afternoons, watching TV, sitting and reading or as a passenger in a car. Bedtime is around 9:30 PM, sleep latency is variable, she sleeps on her back with 4 pillows, reports multiple nocturnal awakenings due to nocturia and is out of bed by 5 AM feeling tired with  dryness of mouth but denies headaches. She naps for 1 to 2 hours daily and naps are refreshing. There is no history suggestive of cataplexy, sleep paralysis or parasomnias    Significant tests/ events reviewed  CT angiogram chest 05/10/2020 negative PE , dilated pulmonary trunk 3.7 cm, hazy pulmonary densities bilateral, no effusions  Echo 04/2020 normal LVEF, normal RV SF. Venous duplex 04/2020 negative  Past Medical History:  Diagnosis Date  . Diabetes mellitus   . Hypertension     History reviewed. No pertinent surgical history.  Allergies  Allergen Reactions  . Benazepril Hcl Hypertension    cough  . Benazepril Hcl     cough    Social History   Socioeconomic History  . Marital status: Single    Spouse name: Not on file  . Number of children: Not on file  . Years of education: Not on file  . Highest education level: Not on file  Occupational History  . Not on file  Tobacco Use  . Smoking status: Never Smoker  . Smokeless tobacco: Never Used  Vaping Use  . Vaping Use: Never used  Substance and Sexual Activity  . Alcohol use: No  . Drug use: No  . Sexual activity: Yes    Birth control/protection: Condom  Other Topics Concern  . Not on file  Social History Narrative  . Not on file   Social Determinants of Health   Financial Resource Strain: Not on file  Food Insecurity:  Not on file  Transportation Needs: Not on file  Physical Activity: Not on file  Stress: Not on file  Social Connections: Not on file  Intimate Partner Violence: Not on file    Family History  Problem Relation Age of Onset  . Hypertension Mother   . Hypertension Father   . Cancer Neg Hx   . Heart disease Neg Hx   . Hyperlipidemia Neg Hx   . Kidney disease Neg Hx   . Diabetes Neg Hx   . Early death Neg Hx   . Hearing loss Neg Hx   . Stroke Neg Hx      Review of Systems  Reports loss of balance, ambulates with a cane  Constitutional: negative for anorexia, fevers and sweats   Eyes: negative for irritation, redness and visual disturbance  Ears, nose, mouth, throat, and face: negative for earaches, epistaxis, nasal congestion and sore throat  Respiratory: negative for cough,  sputum and wheezing  Cardiovascular: negative for chest pain,  palpitations and syncope  Gastrointestinal: negative for abdominal pain, constipation, diarrhea, melena, nausea and vomiting  Genitourinary:negative for dysuria, frequency and hematuria  Hematologic/lymphatic: negative for bleeding, easy bruising and lymphadenopathy  Musculoskeletal:negative for arthralgias, muscle weakness and stiff joints  Neurological: negative for  headaches and weakness  Endocrine: negative for diabetic symptoms including polydipsia, polyuria and weight loss     Objective:   Physical Exam  Gen. Pleasant, obese, in no distress, normal affect ENT - no pallor,icterus, no post nasal drip, class 2 airway Neck: No JVD, no thyromegaly, no carotid bruits Lungs: no use of accessory muscles, no dullness to percussion, decreased without rales or rhonchi  Cardiovascular: Rhythm regular, heart sounds  normal, no murmurs or gallops, 1+ peripheral edema Abdomen: soft and non-tender, no hepatosplenomegaly, BS normal. Musculoskeletal: No deformities, no cyanosis or clubbing Neuro:  alert, non focal, no tremors       Assessment & Plan:

## 2020-06-15 NOTE — Assessment & Plan Note (Signed)
Given excessive daytime somnolence, narrow pharyngeal exam, witnessed apneas & loud snoring, obstructive sleep apnea is very likely & an overnight polysomnogram will be scheduled as a homestudy. The pathophysiology of obstructive sleep apnea , it's cardiovascular consequences & modes of treatment including CPAP were discused with the patient in detail & they evidenced understanding.  No ABG was performed during her hospitalization. Serum bicarbonate was low, so obesity hypoventilation is less likely. If she has significant OSA, would perform in lab CPAP titration

## 2020-06-21 ENCOUNTER — Telehealth: Payer: Self-pay | Admitting: Physician Assistant

## 2020-06-21 DIAGNOSIS — R7989 Other specified abnormal findings of blood chemistry: Secondary | ICD-10-CM | POA: Insufficient documentation

## 2020-06-21 DIAGNOSIS — R202 Paresthesia of skin: Secondary | ICD-10-CM | POA: Insufficient documentation

## 2020-06-21 NOTE — Telephone Encounter (Signed)
Hannah Orr cld to schedule an appt w/Irene on 4/29 at 11am for leukopenia. Hannah Orr is aware to arrive 20 minutes early.

## 2020-06-22 ENCOUNTER — Telehealth: Payer: Self-pay

## 2020-06-22 NOTE — Telephone Encounter (Signed)
NOTES ON FILE FROM  Kim briscoe [phone], SENT REFERRAL TO SCHEDULING

## 2020-06-25 ENCOUNTER — Inpatient Hospital Stay: Payer: BC Managed Care – PPO | Attending: Physician Assistant | Admitting: Physician Assistant

## 2020-06-25 ENCOUNTER — Encounter: Payer: Self-pay | Admitting: *Deleted

## 2020-06-25 ENCOUNTER — Inpatient Hospital Stay: Payer: BC Managed Care – PPO

## 2020-06-25 ENCOUNTER — Other Ambulatory Visit: Payer: Self-pay

## 2020-06-25 ENCOUNTER — Encounter: Payer: Self-pay | Admitting: Physician Assistant

## 2020-06-25 VITALS — BP 162/77 | HR 80 | Temp 97.2°F | Resp 19 | Wt 343.4 lb

## 2020-06-25 DIAGNOSIS — E559 Vitamin D deficiency, unspecified: Secondary | ICD-10-CM | POA: Diagnosis not present

## 2020-06-25 DIAGNOSIS — D890 Polyclonal hypergammaglobulinemia: Secondary | ICD-10-CM

## 2020-06-25 DIAGNOSIS — E669 Obesity, unspecified: Secondary | ICD-10-CM | POA: Insufficient documentation

## 2020-06-25 DIAGNOSIS — K219 Gastro-esophageal reflux disease without esophagitis: Secondary | ICD-10-CM | POA: Insufficient documentation

## 2020-06-25 DIAGNOSIS — R768 Other specified abnormal immunological findings in serum: Secondary | ICD-10-CM | POA: Diagnosis not present

## 2020-06-25 DIAGNOSIS — G4733 Obstructive sleep apnea (adult) (pediatric): Secondary | ICD-10-CM | POA: Insufficient documentation

## 2020-06-25 DIAGNOSIS — R748 Abnormal levels of other serum enzymes: Secondary | ICD-10-CM

## 2020-06-25 DIAGNOSIS — E538 Deficiency of other specified B group vitamins: Secondary | ICD-10-CM | POA: Insufficient documentation

## 2020-06-25 DIAGNOSIS — I10 Essential (primary) hypertension: Secondary | ICD-10-CM | POA: Diagnosis not present

## 2020-06-25 DIAGNOSIS — D72819 Decreased white blood cell count, unspecified: Secondary | ICD-10-CM

## 2020-06-25 DIAGNOSIS — D708 Other neutropenia: Secondary | ICD-10-CM

## 2020-06-25 LAB — CMP (CANCER CENTER ONLY)
ALT: 98 U/L — ABNORMAL HIGH (ref 0–44)
AST: 227 U/L (ref 15–41)
Albumin: 3.8 g/dL (ref 3.5–5.0)
Alkaline Phosphatase: 82 U/L (ref 38–126)
Anion gap: 12 (ref 5–15)
BUN: 6 mg/dL (ref 6–20)
CO2: 26 mmol/L (ref 22–32)
Calcium: 9.3 mg/dL (ref 8.9–10.3)
Chloride: 101 mmol/L (ref 98–111)
Creatinine: 0.75 mg/dL (ref 0.44–1.00)
GFR, Estimated: >60 mL/min (ref >60)
Glucose, Bld: 93 mg/dL (ref 70–99)
Potassium: 4.3 mmol/L (ref 3.5–5.1)
Sodium: 139 mmol/L (ref 135–145)
Total Bilirubin: 0.7 mg/dL (ref 0.3–1.2)
Total Protein: 9.3 g/dL — ABNORMAL HIGH (ref 6.5–8.1)

## 2020-06-25 LAB — SEDIMENTATION RATE: Sed Rate: 72 mm/hr — ABNORMAL HIGH (ref 0–22)

## 2020-06-25 LAB — CBC WITH DIFFERENTIAL (CANCER CENTER ONLY)
Abs Immature Granulocytes: 0 10*3/uL (ref 0.00–0.07)
Basophils Absolute: 0 10*3/uL (ref 0.0–0.1)
Basophils Relative: 1 %
Eosinophils Absolute: 0.1 10*3/uL (ref 0.0–0.5)
Eosinophils Relative: 7 %
HCT: 38.4 % (ref 36.0–46.0)
Hemoglobin: 12.4 g/dL (ref 12.0–15.0)
Immature Granulocytes: 0 %
Lymphocytes Relative: 43 %
Lymphs Abs: 1 10*3/uL (ref 0.7–4.0)
MCH: 34.6 pg — ABNORMAL HIGH (ref 26.0–34.0)
MCHC: 32.3 g/dL (ref 30.0–36.0)
MCV: 107.3 fL — ABNORMAL HIGH (ref 80.0–100.0)
Monocytes Absolute: 0.4 10*3/uL (ref 0.1–1.0)
Monocytes Relative: 17 %
Neutro Abs: 0.7 10*3/uL — ABNORMAL LOW (ref 1.7–7.7)
Neutrophils Relative %: 32 %
Platelet Count: 243 10*3/uL (ref 150–400)
RBC: 3.58 MIL/uL — ABNORMAL LOW (ref 3.87–5.11)
RDW: 12.9 % (ref 11.5–15.5)
WBC Count: 2.2 10*3/uL — ABNORMAL LOW (ref 4.0–10.5)
nRBC: 0 % (ref 0.0–0.2)

## 2020-06-25 LAB — FOLATE: Folate: 10.8 ng/mL (ref >5.9)

## 2020-06-25 LAB — SAVE SMEAR(SSMR), FOR PROVIDER SLIDE REVIEW

## 2020-06-25 LAB — HEPATITIS B SURFACE ANTIBODY,QUALITATIVE: Hep B S Ab: REACTIVE — AB

## 2020-06-25 LAB — TSH: TSH: 3.534 u[IU]/mL (ref 0.308–3.960)

## 2020-06-25 LAB — HEPATITIS B CORE ANTIBODY, TOTAL: Hep B Core Total Ab: NONREACTIVE

## 2020-06-25 LAB — C-REACTIVE PROTEIN: CRP: 2.5 mg/dL — ABNORMAL HIGH (ref <1.0)

## 2020-06-25 LAB — HEPATITIS B SURFACE ANTIGEN: Hepatitis B Surface Ag: NONREACTIVE

## 2020-06-25 LAB — HEPATITIS C ANTIBODY: HCV Ab: NONREACTIVE

## 2020-06-25 LAB — VITAMIN B12: Vitamin B-12: 450 pg/mL (ref 180–914)

## 2020-06-25 NOTE — Progress Notes (Signed)
[place] lab called to report to me an abnormal lab.  The following abnormalities are noted:  AST 227.   Dede Query, PA notified of this result.   Gardiner Rhyme, RN 06/25/2020 12:48 PM

## 2020-06-25 NOTE — Progress Notes (Addendum)
[facility] Telephone:(336) [phone]   Fax:(336) [place] NOTE  Patient Care Team: Katherina Mires, MD as PCP - General (Family Medicine)  Hematological/Oncological History 1) Labs from prior:   Ref. Range 10/17/2012 10:38 11/24/2013 12:01 04/27/2020 19:53 05/12/2020 22:41 05/13/2020 04:10 05/14/2020 09:10  WBC Latest Ref Range: 4.0 - 10.5 K/uL 4.4 (L) 3.2 (L) 2.3 (L) 2.4 (L) 2.0 (L) 1.8 (L)   2) 05/12/2020-05/14/2020: Admitted for dyspnea on exertion and edema. CTA rule out PE but suggested some degree of active CHF but TTE ruled out CHF. Labs revealed vitamin B12 and borderline folate deficiency. Patient was initiated on Vitamin B12 injections and started on oral folic acid supplementation. Labs did show elevated liver enzymes, CK normal.  3) 06/25/2020: Establish care with Dede Query PA-C   CHIEF COMPLAINTS/PURPOSE OF CONSULTATION:  "Leukopenia"  HISTORY OF PRESENTING ILLNESS:  Hannah Orr 45 y.o. female with medical history significant for obesity, hypertension, OSA and GERD  On review of the previous records, Hannah Orr is noted to have chronic leukopenia as far back as 2014. WBC levels have progressively worsened until most recent level of 1.8 in March 2022. Patient was recently admitted from 05/12/2020 until 05/14/2020 due to dyspnea on exertion and worsening lower extremity edema. CTA ruled out PE but was suggestive of CHF. However, TTE rule out CHF. Labs obtained revealed persistent leukopenia, elevated liver enzymes, vitamin B12 deficiency, folate deficiency and vitamin D deficiency. SPEP revealed Polyclonal increase multiple immunoglobulins.Patient was started on B12 injections and discharged with folic acid and vitamin D oral supplementations.   On exam today, Hannah Orr, Hannah Orr reports feeling fatigued but is able to complete her ADLs on her own. She notes decreased mobility due to lower extremity edema. The edema has improved some with Lasix she takes  2-3 times per day. She has a great appetite without any dietary restrictions. Patient denies any nausea, vomiting or abdominal pain. She has daily bowel movements without any diarrhea or constipation. Patient denies any easy bruising or signs of bleeding.  This includes hematochezia, melena, hematuria, hemoptysis, epistaxis or gingival bleeding. She has persistent shortness of breath with exertion but none at rest.  Patient denies any fevers, chills, chest pain, night sweats, neuropathy or other skin changes.  Rest of the 10 point ROS is below.  MEDICAL HISTORY:  Past Medical History:  Diagnosis Date  . Hypertension     SURGICAL HISTORY: History reviewed. No pertinent surgical history.  SOCIAL HISTORY: Social History   Socioeconomic History  . Marital status: Single    Spouse name: Not on file  . Number of children: Not on file  . Years of education: Not on file  . Highest education level: Not on file  Occupational History  . Not on file  Tobacco Use  . Smoking status: Never Smoker  . Smokeless tobacco: Never Used  Vaping Use  . Vaping Use: Never used  Substance and Sexual Activity  . Alcohol use: Not Currently    Comment: Stopped drinking December 2021.   . Drug use: No  . Sexual activity: Yes    Birth control/protection: Condom  Other Topics Concern  . Not on file  Social History Narrative  . Not on file   Social Determinants of Health   Financial Resource Strain: Not on file  Food Insecurity: Not on file  Transportation Needs: Not on file  Physical Activity: Not on file  Stress: Not on file  Social Connections: Not on file  Intimate  Partner Violence: Not on file    FAMILY HISTORY: Family History  Problem Relation Age of Onset  . Hypertension Mother   . Breast cancer Mother   . Hypertension Father   . Prostate cancer Father   . Cancer Neg Hx   . Heart disease Neg Hx   . Hyperlipidemia Neg Hx   . Kidney disease Neg Hx   . Diabetes Neg Hx   . Early death  Neg Hx   . Hearing loss Neg Hx   . Stroke Neg Hx     ALLERGIES:  is allergic to benazepril hcl and benazepril hcl.  MEDICATIONS:  Current Outpatient Medications  Medication Sig Dispense Refill  . albuterol (PROVENTIL HFA;VENTOLIN HFA) 108 (90 Base) MCG/ACT inhaler Inhale 2 puffs into the lungs every 6 (six) hours as needed for wheezing. (Patient not taking: Reported on 06/15/2020) 1 Inhaler 0  . carvedilol (COREG) 12.5 MG tablet Take 1 tablet (12.5 mg total) by mouth 2 (two) times daily with a meal. 180 tablet 1  . Cholecalciferol (VITAMIN D3) 50 MCG (2000 UT) capsule Take 1 capsule (2,000 Units total) by mouth daily. 90 capsule 0  . FLUoxetine (PROZAC) 40 MG capsule Take 40 mg by mouth daily.    . folic acid (FOLVITE) 1 MG tablet Take 1 tablet (1 mg total) by mouth daily. 90 tablet 1  . furosemide (LASIX) 40 MG tablet Take 1 tablet (40 mg total) by mouth daily. May take additional dose in the afternoon for more swelling or shortness of breath 180 tablet 1  . gabapentin (NEURONTIN) 300 MG capsule Take 300 mg by mouth at bedtime.    . potassium chloride (MICRO-K) 10 MEQ CR capsule Take 10 mEq by mouth 2 (two) times daily.    . vitamin B-12 (CYANOCOBALAMIN) 1000 MCG tablet Take 1 tablet (1,000 mcg total) by mouth daily. 90 tablet 1   Current Facility-Administered Medications  Medication Dose Route Frequency Provider Last Rate Last Admin  . methylPREDNISolone acetate (DEPO-MEDROL) injection 80 mg  80 mg Intramuscular Once Nche, Charlene Brooke, NP        REVIEW OF SYSTEMS:   Constitutional: ( - ) fevers, ( - )  chills , ( - ) night sweats Eyes: ( - ) blurriness of vision, ( - ) double vision, ( - ) watery eyes Ears, nose, mouth, throat, and face: ( - ) mucositis, ( - ) sore throat Respiratory: ( - ) cough, ( + ) dyspnea, ( - ) wheezes Cardiovascular: ( - ) palpitation, ( - ) chest discomfort, ( + ) lower extremity swelling Gastrointestinal:  ( - ) nausea, ( - ) heartburn, ( - ) change in  bowel habits Skin: ( - ) abnormal skin rashes Lymphatics: ( - ) new lymphadenopathy, ( - ) easy bruising Neurological: ( - ) numbness, ( - ) tingling, ( - ) new weaknesses Behavioral/Psych: ( - ) mood change, ( - ) new changes  All other systems were reviewed with the patient and are negative.  PHYSICAL EXAMINATION: ECOG PERFORMANCE STATUS: 1 - Symptomatic but completely ambulatory  Vitals:   06/25/20 1043  BP: (!) 162/77  Pulse: 80  Resp: 19  Temp: (!) 97.2 F (36.2 C)  SpO2: 96%   Filed Weights   06/25/20 1043  Weight: (!) 343 lb 6.4 oz (155.8 kg)    GENERAL: female, obese in NAD  SKIN: skin color, texture, turgor are normal, no rashes or significant lesions EYES: conjunctiva are pink and non-injected, sclera clear  OROPHARYNX: no exudate, no erythema; lips, buccal mucosa, and tongue normal  NECK: supple, non-tender LYMPH:  no palpable lymphadenopathy in the cervical, axillary or supraclavicular lymph nodes.  LUNGS: clear to auscultation and percussion with normal breathing effort HEART: regular rate & rhythm and no murmurs and no lower extremity edema ABDOMEN: soft, non-tender, non-distended, normal bowel sounds Musculoskeletal: no cyanosis of digits and no clubbing  PSYCH: alert & oriented x 3, fluent speech NEURO: no focal motor/sensory deficits  LABORATORY DATA:  I have reviewed the data as listed CBC Latest Ref Rng & Units 06/25/2020 05/14/2020 05/13/2020  WBC 4.0 - 10.5 K/uL 2.2(L) 1.8(L) 2.0(L)  Hemoglobin 12.0 - 15.0 g/dL 12.4 12.9 12.2  Hematocrit 36.0 - 46.0 % 38.4 39.0 36.7  Platelets 150 - 400 K/uL 243 268 271    CMP Latest Ref Rng & Units 06/25/2020 05/14/2020 05/13/2020  Glucose 70 - 99 mg/dL 93 97 95  BUN 6 - 20 mg/dL 6 10 7   Creatinine 0.44 - 1.00 mg/dL 0.75 0.73 0.68  Sodium 135 - 145 mmol/L 139 135 135  Potassium 3.5 - 5.1 mmol/L 4.3 3.2(L) 3.3(L)  Chloride 98 - 111 mmol/L 101 94(L) 96(L)  CO2 22 - 32 mmol/L 26 26 24   Calcium 8.9 - 10.3 mg/dL 9.3 8.9  8.8(L)  Total Protein 6.5 - 8.1 g/dL 9.3(H) 8.4(H) 9.0(H)  Total Bilirubin 0.3 - 1.2 mg/dL 0.7 1.7(H) 1.0  Alkaline Phos 38 - 126 U/L 82 79 87  AST 15 - 41 U/L 227(HH) 168(H) 196(H)  ALT 0 - 44 U/L 98(H) 84(H) 94(H)    ASSESSMENT & PLAN Hannah Orr is a 45 y.o. female presents to the clinic for evaluation for leukopenia.  I reviewed common etiologies including infectious processes, nutritional deficiencies, liver disease and bone marrow disorders.  Upon review of outside records, this is a neutrophilic predominance.  I recommend further work-up with labs today to check CBC, CMP, Hepatitis B and C serologies, vitamin B12, folate, ESR, CRP and Save smear.   #Leukopenia, neutrophilic predominant --Patient was recently diagnosed with B12 and folate deficiencies which can cause leukopenia.  --Need to evaluate for underlying liver disease due to recent labs that showed elevated liver enzymes. Patient denies alcohol intake for the last 6 months. Recent US from 04/27/2020 shows hepatic steatosis and mild hepatomegaly, no evidence of cirrhosis. Recommend referral to gastroenterology for further recommendations.  --Labs today to check CBC, CMP, Hepatitis B and C serologies, vitamin B12, folate, ESR, CRP and Save smear.  --Recent HIV serology was nonreactive.  --RTC based on above workup.   # B12 deficiency: --Given B12 injection during admission on 3/16-3/18/2022 and d/c with oral Vitamin B12 1000 mcg daily.  # Folate deficiency: --Currently on folic acid 1 mg daily.   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 --Underlying etiology includes liver disease, autoimmune conditions, infectious processes, bone marrow disorders or other malignancies.  --Will check ESR and CRP to evaluate for inflammatory conditions.  --Referral to GI to evaluate for liver disease.     Orders Placed This Encounter  Procedures  . CBC with Differential (Cancer Center Only)     Standing Status:   Future    Number of Occurrences:   1    Standing Expiration Date:   06/24/2021  . CMP ([facility] only)    Standing Status:   Future    Number of Occurrences:   1    Standing Expiration Date:   06/24/2021  .  Folate, Serum    Standing Status:   Future    Number of Occurrences:   1    Standing Expiration Date:   06/24/2021  . Vitamin B12    Standing Status:   Future    Number of Occurrences:   1    Standing Expiration Date:   06/24/2021  . Hepatitis B core antibody, total    Standing Status:   Future    Number of Occurrences:   1    Standing Expiration Date:   06/24/2021  . Hepatitis B surface antibody    Standing Status:   Future    Number of Occurrences:   1    Standing Expiration Date:   06/24/2021  . Hepatitis B surface antigen    Standing Status:   Future    Number of Occurrences:   1    Standing Expiration Date:   06/24/2021  . Hepatitis C antibody    Standing Status:   Future    Number of Occurrences:   1    Standing Expiration Date:   06/24/2021  . Save Smear ([facility])    Standing Status:   Future    Number of Occurrences:   1    Standing Expiration Date:   06/24/2021  . Sedimentation rate    Standing Status:   Future    Number of Occurrences:   1    Standing Expiration Date:   06/24/2021  . C-reactive protein    Standing Status:   Future    Number of Occurrences:   1    Standing Expiration Date:   06/24/2021  . TSH    Standing Status:   Future    Number of Occurrences:   1    Standing Expiration Date:   06/25/2021  . Ambulatory referral to Gastroenterology    Referral Priority:   Urgent    Referral Type:   Consultation    Referral Reason:   Specialty Services Required    Number of Visits Requested:   1    All questions were answered. The patient knows to call the clinic with any problems, questions or concerns.  A total of more than 60 minutes were spent on this encounter and over half of that time was spent on counseling and coordination of  care as outlined above.    Dede Query, PA-C Department of Hematology/Oncology [place] at [facility] Phone: [phone]

## 2020-06-26 DIAGNOSIS — D890 Polyclonal hypergammaglobulinemia: Secondary | ICD-10-CM | POA: Insufficient documentation

## 2020-06-28 ENCOUNTER — Telehealth: Payer: Self-pay | Admitting: Gastroenterology

## 2020-06-28 NOTE — Telephone Encounter (Signed)
Records reviewed. I don't think the treating MD at the [facility] was aware that she was seen for abnormal liver enzymes in March. Please have her follow-up with her doctor at [facility] who was fully evaluating her. Thanks.

## 2020-06-28 NOTE — Telephone Encounter (Signed)
Hey Dr Tarri Glenn this pt is being referred to [place] from the [facility] for Elevated Liver Enzymes but it looks like the pt was seen by [facility] on 3/22, notes are in epic for review, please advise on schedulng

## 2020-06-30 ENCOUNTER — Telehealth: Payer: Self-pay | Admitting: Physician Assistant

## 2020-06-30 NOTE — Telephone Encounter (Signed)
Scheduled follow-up appointment per 5/4 staff message. Patient is aware.

## 2020-07-01 ENCOUNTER — Telehealth: Payer: Self-pay | Admitting: Physician Assistant

## 2020-07-01 NOTE — Telephone Encounter (Signed)
I spoke to Ms. Arnold Long to review lab results. Labs results were notable for persistent neutropenia with ANC of 700, elevated sed rate of 72, and elevated liver enzymes. Hepatitis, HIV serologies were unremarkable. Recommend to follow up with [facility] team to further evaluate for underlying liver disease. Patient is scheduled to see GI next week. We will see patient back in clinic on 07/28/20 and decide if further diagnostic testing is required including a bone marrow biopsy. Patient expressed understanding and satisfaction with the plan provided.

## 2020-07-06 NOTE — Telephone Encounter (Signed)
Patient is aware of recommendations °

## 2020-07-12 DIAGNOSIS — E538 Deficiency of other specified B group vitamins: Secondary | ICD-10-CM | POA: Insufficient documentation

## 2020-07-21 ENCOUNTER — Telehealth: Payer: Self-pay | Admitting: Pulmonary Disease

## 2020-07-21 NOTE — Telephone Encounter (Signed)
lmam for pt to call back

## 2020-07-23 NOTE — Telephone Encounter (Signed)
I will close this note since I have this order

## 2020-07-27 ENCOUNTER — Other Ambulatory Visit: Payer: Self-pay | Admitting: Physician Assistant

## 2020-07-27 DIAGNOSIS — D708 Other neutropenia: Secondary | ICD-10-CM

## 2020-07-28 ENCOUNTER — Inpatient Hospital Stay: Payer: 59

## 2020-07-28 ENCOUNTER — Telehealth: Payer: Self-pay

## 2020-07-28 ENCOUNTER — Encounter: Payer: Self-pay | Admitting: Physician Assistant

## 2020-07-28 ENCOUNTER — Other Ambulatory Visit: Payer: Self-pay

## 2020-07-28 ENCOUNTER — Inpatient Hospital Stay: Payer: 59 | Attending: Physician Assistant | Admitting: Physician Assistant

## 2020-07-28 DIAGNOSIS — E538 Deficiency of other specified B group vitamins: Secondary | ICD-10-CM | POA: Insufficient documentation

## 2020-07-28 DIAGNOSIS — K7581 Nonalcoholic steatohepatitis (NASH): Secondary | ICD-10-CM | POA: Diagnosis not present

## 2020-07-28 DIAGNOSIS — D708 Other neutropenia: Secondary | ICD-10-CM

## 2020-07-28 DIAGNOSIS — I10 Essential (primary) hypertension: Secondary | ICD-10-CM | POA: Diagnosis not present

## 2020-07-28 DIAGNOSIS — D709 Neutropenia, unspecified: Secondary | ICD-10-CM | POA: Insufficient documentation

## 2020-07-28 DIAGNOSIS — D72819 Decreased white blood cell count, unspecified: Secondary | ICD-10-CM | POA: Diagnosis not present

## 2020-07-28 LAB — VITAMIN B12: Vitamin B-12: 260 pg/mL (ref 180–914)

## 2020-07-28 LAB — CMP (CANCER CENTER ONLY)
ALT: 120 U/L — ABNORMAL HIGH (ref 0–44)
AST: 295 U/L (ref 15–41)
Albumin: 3.7 g/dL (ref 3.5–5.0)
Alkaline Phosphatase: 82 U/L (ref 38–126)
Anion gap: 14 (ref 5–15)
BUN: 4 mg/dL — ABNORMAL LOW (ref 6–20)
CO2: 24 mmol/L (ref 22–32)
Calcium: 9.2 mg/dL (ref 8.9–10.3)
Chloride: 102 mmol/L (ref 98–111)
Creatinine: 0.75 mg/dL (ref 0.44–1.00)
GFR, Estimated: >60 mL/min (ref >60)
Glucose, Bld: 89 mg/dL (ref 70–99)
Potassium: 4.2 mmol/L (ref 3.5–5.1)
Sodium: 140 mmol/L (ref 135–145)
Total Bilirubin: 1 mg/dL (ref 0.3–1.2)
Total Protein: 9 g/dL — ABNORMAL HIGH (ref 6.5–8.1)

## 2020-07-28 LAB — CBC WITH DIFFERENTIAL (CANCER CENTER ONLY)
Abs Immature Granulocytes: 0 10*3/uL (ref 0.00–0.07)
Basophils Absolute: 0 10*3/uL (ref 0.0–0.1)
Basophils Relative: 1 %
Eosinophils Absolute: 0.1 10*3/uL (ref 0.0–0.5)
Eosinophils Relative: 4 %
HCT: 35.8 % — ABNORMAL LOW (ref 36.0–46.0)
Hemoglobin: 12 g/dL (ref 12.0–15.0)
Immature Granulocytes: 0 %
Lymphocytes Relative: 32 %
Lymphs Abs: 0.6 10*3/uL — ABNORMAL LOW (ref 0.7–4.0)
MCH: 35.5 pg — ABNORMAL HIGH (ref 26.0–34.0)
MCHC: 33.5 g/dL (ref 30.0–36.0)
MCV: 105.9 fL — ABNORMAL HIGH (ref 80.0–100.0)
Monocytes Absolute: 0.3 10*3/uL (ref 0.1–1.0)
Monocytes Relative: 16 %
Neutro Abs: 0.9 10*3/uL — ABNORMAL LOW (ref 1.7–7.7)
Neutrophils Relative %: 47 %
Platelet Count: 208 10*3/uL (ref 150–400)
RBC: 3.38 MIL/uL — ABNORMAL LOW (ref 3.87–5.11)
RDW: 13.7 % (ref 11.5–15.5)
WBC Count: 1.9 10*3/uL — ABNORMAL LOW (ref 4.0–10.5)
nRBC: 0 % (ref 0.0–0.2)

## 2020-07-28 NOTE — Telephone Encounter (Signed)
CRITICAL VALUE STICKER  CRITICAL VALUE: AST 295  RECEIVER (on-site recipient of call): Aura Fey, RN  [place] NOTIFIED: 07/28/20 at 1:03 pm  MESSENGER (representative from lab): Lelan Pons  MD NOTIFIED: Dede Query, PA  TIME OF NOTIFICATION: 1:04 pm  RESPONSE: Critical lab communicated to Dede Query, PA per secure chat for further recommendations.

## 2020-07-28 NOTE — Progress Notes (Addendum)
[place] [phone]   [phone]  PROGRESS NOTE  Patient Care Team: Lucianne Lei, MD as PCP - General (Family Medicine)  Hematological/Oncological History 1) Labs from prior:   Ref. Range 10/17/2012 10:38 11/24/2013 12:01 04/27/2020 19:53 05/12/2020 22:41 05/13/2020 04:10 05/14/2020 09:10  WBC Latest Ref Range: 4.0 - 10.5 K/uL 4.4 (L) 3.2 (L) 2.3 (L) 2.4 (L) 2.0 (L) 1.8 (L)   2) 05/12/2020-05/14/2020: Admitted for dyspnea on exertion and edema. CTA rule out PE but suggested some degree of active CHF but TTE ruled out CHF. Labs revealed vitamin B12 and borderline folate deficiency. Patient was initiated on Vitamin B12 injections and started on oral folic acid supplementation. Labs did show elevated liver enzymes, CK normal.  3) 06/25/2020: Establish care with Dede Query PA-C Labs revealed WBC 2.2 (L), Hgb 12.4, MCV 107.3 (H), Plt 243, Neut # 0.7 (L), CRP 2.5 (H), Sed Rate 72 (H), AST 227 (H), ALT 98, Tbili 0.7  4) 07/13/2020: Underwent US guided liver biopsy. Pathology revealed steatohepatitis.   INTERVAL HISTORY:  Hannah Orr returns today for a follow up to evaluate for neutropenia. Patient is unaccompanied for this visit. Since the last visit, she underwent liver biopsy on 07/13/2020. She notes some discomfort at the biopsy site that is slowly improving. Patient reports that her energy levels are overall stable. She is fatigue but can complete her ADLs on her own. She denies any changes to her appetite but continues to gain weight. Patient denies any nausea, vomiting or abdominal pain. Her bowel movements are regular without ay diarrhea or constipation. She denies any easy bruising or signs of bleeding.  She notes right sided back pain that is intermittent but rates it as 8 out of 10 on a pain scale. She takes Tylenol 650 mg BID which improves the pain. Patient continues to have bilateral lower extremity edema. She notes that lower extremity edema does  interfere with ambulation. She currently takes Lasix 40 mg daily with minimal improvement. She is unable to wear compression stockings. There isn't improvement of edema with elevation of her legs. She has persistent shortness of breath with exertion but none at rest.  Patient denies any fevers, chills, chest pain, night sweats, neuropathy or other skin changes.  Rest of the 10 point ROS is below.  MEDICAL HISTORY:  Past Medical History:  Diagnosis Date  . Hypertension     SURGICAL HISTORY: No past surgical history on file.  SOCIAL HISTORY: Social History   Socioeconomic History  . Marital status: Single    Spouse name: Not on file  . Number of children: Not on file  . Years of education: Not on file  . Highest education level: Not on file  Occupational History  . Not on file  Tobacco Use  . Smoking status: Never Smoker  . Smokeless tobacco: Never Used  Vaping Use  . Vaping Use: Never used  Substance and Sexual Activity  . Alcohol use: Not Currently    Comment: Stopped drinking December 2021.   . Drug use: No  . Sexual activity: Yes    Birth control/protection: Condom  Other Topics Concern  . Not on file  Social History Narrative  . Not on file   Social Determinants of Health   Financial Resource Strain: Not on file  Food Insecurity: Not on file  Transportation Needs: Not on file  Physical Activity: Not on file  Stress: Not on file  Social Connections: Not on file  Intimate Partner Violence:  Not on file    FAMILY HISTORY: Family History  Problem Relation Age of Onset  . Hypertension Mother   . Breast cancer Mother   . Hypertension Father   . Prostate cancer Father   . Cancer Neg Hx   . Heart disease Neg Hx   . Hyperlipidemia Neg Hx   . Kidney disease Neg Hx   . Diabetes Neg Hx   . Early death Neg Hx   . Hearing loss Neg Hx   . Stroke Neg Hx     ALLERGIES:  is allergic to benazepril hcl and benazepril hcl.  MEDICATIONS:  Current Outpatient  Medications  Medication Sig Dispense Refill  . albuterol (PROVENTIL HFA;VENTOLIN HFA) 108 (90 Base) MCG/ACT inhaler Inhale 2 puffs into the lungs every 6 (six) hours as needed for wheezing. 1 Inhaler 0  . carvedilol (COREG) 12.5 MG tablet Take 1 tablet (12.5 mg total) by mouth 2 (two) times daily with a meal. 180 tablet 1  . Cholecalciferol (VITAMIN D3) 50 MCG (2000 UT) capsule Take 1 capsule (2,000 Units total) by mouth daily. 90 capsule 0  . FLUoxetine (PROZAC) 40 MG capsule Take 40 mg by mouth daily.    . folic acid (FOLVITE) 1 MG tablet Take 1 tablet (1 mg total) by mouth daily. 90 tablet 1  . furosemide (LASIX) 40 MG tablet Take 1 tablet (40 mg total) by mouth daily. May take additional dose in the afternoon for more swelling or shortness of breath 180 tablet 1  . gabapentin (NEURONTIN) 300 MG capsule Take 300 mg by mouth at bedtime.    . potassium chloride (MICRO-K) 10 MEQ CR capsule Take 10 mEq by mouth 2 (two) times daily.    . vitamin B-12 (CYANOCOBALAMIN) 1000 MCG tablet Take 1 tablet (1,000 mcg total) by mouth daily. 90 tablet 1   Current Facility-Administered Medications  Medication Dose Route Frequency Provider Last Rate Last Admin  . methylPREDNISolone acetate (DEPO-MEDROL) injection 80 mg  80 mg Intramuscular Once Nche, Charlene Brooke, NP        REVIEW OF SYSTEMS:   Constitutional: ( - ) fevers, ( - )  chills , ( - ) night sweats Eyes: ( - ) blurriness of vision, ( - ) double vision, ( - ) watery eyes Ears, nose, mouth, throat, and face: ( - ) mucositis, ( - ) sore throat Respiratory: ( - ) cough, ( + ) dyspnea, ( - ) wheezes Cardiovascular: ( - ) palpitation, ( - ) chest discomfort, ( + ) lower extremity swelling Gastrointestinal:  ( - ) nausea, ( - ) heartburn, ( - ) change in bowel habits Skin: ( - ) abnormal skin rashes Lymphatics: ( - ) new lymphadenopathy, ( - ) easy bruising Neurological: ( - ) numbness, ( - ) tingling, ( - ) new weaknesses Behavioral/Psych: ( - ) mood  change, ( - ) new changes  All other systems were reviewed with the patient and are negative.  PHYSICAL EXAMINATION: ECOG PERFORMANCE STATUS: 1 - Symptomatic but completely ambulatory  Vitals:   07/28/20 1240  BP: (!) 189/98  Pulse: 96  Resp: 20  Temp: 97.9 F (36.6 C)  SpO2: 94%   Filed Weights   07/28/20 1240  Weight: (!) 351 lb 4.8 oz (159.3 kg)    GENERAL: female, obese in NAD  SKIN: skin color, texture, turgor are normal, no rashes or significant lesions EYES: conjunctiva are pink and non-injected, sclera clear OROPHARYNX: no exudate, no erythema; lips, buccal mucosa, and  tongue normal  NECK: supple, non-tender LYMPH:  no palpable lymphadenopathy in the cervical, axillary or supraclavicular lymph nodes.  LUNGS: clear to auscultation and percussion with normal breathing effort HEART: regular rate & rhythm and no murmurs. Bilateral lower extremity edema.  ABDOMEN: soft, non-tender, non-distended, normal bowel sounds Musculoskeletal: no cyanosis of digits and no clubbing  PSYCH: alert & oriented x 3, fluent speech NEURO: no focal motor/sensory deficits  LABORATORY DATA:  I have reviewed the data as listed CBC Latest Ref Rng & Units 07/28/2020 06/25/2020 05/14/2020  WBC 4.0 - 10.5 K/uL 1.9(L) 2.2(L) 1.8(L)  Hemoglobin 12.0 - 15.0 g/dL 12.0 12.4 12.9  Hematocrit 36.0 - 46.0 % 35.8(L) 38.4 39.0  Platelets 150 - 400 K/uL 208 243 268    CMP Latest Ref Rng & Units 07/28/2020 06/25/2020 05/14/2020  Glucose 70 - 99 mg/dL 89 93 97  BUN 6 - 20 mg/dL 4(L) 6 10  Creatinine 0.44 - 1.00 mg/dL 0.75 0.75 0.73  Sodium 135 - 145 mmol/L 140 139 135  Potassium 3.5 - 5.1 mmol/L 4.2 4.3 3.2(L)  Chloride 98 - 111 mmol/L 102 101 94(L)  CO2 22 - 32 mmol/L 24 26 26   Calcium 8.9 - 10.3 mg/dL 9.2 9.3 8.9  Total Protein 6.5 - 8.1 g/dL 9.0(H) 9.3(H) 8.4(H)  Total Bilirubin 0.3 - 1.2 mg/dL 1.0 0.7 1.7(H)  Alkaline Phos 38 - 126 U/L 82 82 79  AST 15 - 41 U/L 295(HH) 227(HH) 168(H)  ALT 0 - 44 U/L  120(H) 98(H) 84(H)    ASSESSMENT & PLAN Hannah Orr is a 45 y.o. female presents to the clinic for a follow up for neutropenia.  #Leukopenia, neutrophilic predominant --Likely secondary to underlying liver disease +/- B12 deficiency.  -- Recent US guided liver biopsy on 07/13/2020 confirmed steatohepatitis.  --Labs today show stable neutropenia with ANC of 0.9, AST is 295 and ALT is 120.  --No further intervention is recommended at this time and needs management of liver disease.Patient is under the care of [facility] and is scheduled for a follow up on 08/04/2020.  --RTC pending unless there is persistent neutropenia without liver dysfunction and/or evidence of additional hematological abnormalities (I.e. anemia, thrombocytopenia, etc).   # B12 deficiency: --Currently on oral Vitamin B12 1000 mcg daily. --Patient has started receiving monthly B12 injections through her PCP, most recent injection given on 07/12/2020.  --Today's vitamin B12 level is 260.  --Recommend to continue with monthly B12 injections.   # Folate deficiency: --Currently on folic acid 1 mg daily. Recommend to continue.    # Hypertension: --Initial BP was 205/103. Repeat check, it was 189/98. Patient is asymptomatic but notes that she didn't take her BP medication this morning.  --Advised to take medication today and check BP daily. --She will follow up with her PCP.   No orders of the defined types were placed in this encounter.   All questions were answered. The patient knows to call the clinic with any problems, questions or concerns.  Case was discussed with Dr. Lorenso Courier.   I have spent a total of 25 minutes minutes of face-to-face and non-face-to-face time, preparing to see the patient, obtaining and/or reviewing separately obtained history, performing a medically appropriate examination, counseling and educating the patient, documenting clinical information in the electronic health record, and care  coordination.    Dede Query, PA-C Department of Hematology/Oncology [facility] Phone: [phone]

## 2020-07-29 LAB — HOMOCYSTEINE: Homocysteine: 16.8 umol/L — ABNORMAL HIGH (ref 0.0–14.5)

## 2020-07-31 LAB — METHYLMALONIC ACID, SERUM: Methylmalonic Acid, Quantitative: 97 nmol/L (ref 0–378)

## 2020-08-09 ENCOUNTER — Encounter: Payer: Self-pay | Admitting: Pulmonary Disease

## 2020-08-12 ENCOUNTER — Encounter: Payer: Self-pay | Admitting: *Deleted

## 2020-09-02 DIAGNOSIS — M7989 Other specified soft tissue disorders: Secondary | ICD-10-CM | POA: Insufficient documentation

## 2020-09-02 NOTE — Progress Notes (Signed)
Cardiology Office Note   Date:  09/03/2020   ID:  Hannah Orr, DOB [birth year], MRN [id number]  PCP:  Lucianne Lei, MD  Cardiologist:   None Referring:  Lucianne Lei, MD  Chief Complaint  Patient presents with   Edema      History of Present Illness: Hannah Orr is a 45 y.o. female who is referred by Lucianne Lei, MD for evaluation of edema and difficult to control HTN.   She was in the hospital in March for this.  She had IV diuresis.  I did review these records for this appointment.  An echocardiogram was basically unremarkable.  She was taken off of amlodipine because of the swelling and started on carvedilol.  She was to have a sleep study home test but she canceled this.  She is here for management of both issues.  She gets around slowly with a cane because of her swelling causing leg pain.  She also has joint pain.  She does get mildly short of breath with activities but she is not describing new PND or orthopnea.  She is not having any chest pressure, neck or arm discomfort.  She has been on low-carb diet and losing some weight.  She does snore.  She has not had any prior cardiac work-up or history.  She said she has been getting this swelling since about February.  She does not in particular use salt but her diet may include this.  She is not excessive with fluid.   Past Medical History:  Diagnosis Date   Depression    Hypertension     History reviewed. No pertinent surgical history.   Current Outpatient Medications  Medication Sig Dispense Refill   albuterol (PROVENTIL HFA;VENTOLIN HFA) 108 (90 Base) MCG/ACT inhaler Inhale 2 puffs into the lungs every 6 (six) hours as needed for wheezing. 1 Inhaler 0   carvedilol (COREG) 12.5 MG tablet Take 1 tablet (12.5 mg total) by mouth 2 (two) times daily with a meal. 180 tablet 1   Cholecalciferol (VITAMIN D3) 50 MCG (2000 UT) capsule Take 1 capsule (2,000 Units total) by mouth daily. 90 capsule 0   FLUoxetine  (PROZAC) 40 MG capsule Take 40 mg by mouth daily.     folic acid (FOLVITE) 1 MG tablet Take 1 tablet (1 mg total) by mouth daily. 90 tablet 1   furosemide (LASIX) 40 MG tablet Take 1 tablet (40 mg total) by mouth daily. May take additional dose in the afternoon for more swelling or shortness of breath 180 tablet 1   gabapentin (NEURONTIN) 300 MG capsule Take 300 mg by mouth at bedtime.     spironolactone (ALDACTONE) 50 MG tablet Take 1 tablet (50 mg total) by mouth daily. 90 tablet 1   vitamin B-12 (CYANOCOBALAMIN) 1000 MCG tablet Take 1 tablet (1,000 mcg total) by mouth daily. 90 tablet 1   Current Facility-Administered Medications  Medication Dose Route Frequency Provider Last Rate Last Admin   methylPREDNISolone acetate (DEPO-MEDROL) injection 80 mg  80 mg Intramuscular Once Nche, Charlene Brooke, NP        Allergies:   Benazepril hcl and Benazepril hcl    Social History:  The patient  reports that she has never smoked. She has never used smokeless tobacco. She reports previous alcohol use. She reports that she does not use drugs.   Family History:  The patient's family history includes Breast cancer in her mother; Hypertension in her father and mother; Prostate cancer in her  father.    ROS:  Please see the history of present illness.   Otherwise, review of systems are positive for none.   All other systems are reviewed and negative.    PHYSICAL EXAM: VS:  BP (!) 180/80 (BP Location: Right Arm)   Pulse 92   Ht 5\' 5"  (1.651 m)   Wt (!) 337 lb 6.4 oz (153 kg)   SpO2 93%   BMI 56.15 kg/m  , BMI Body mass index is 56.15 kg/m. GENERAL:  Well appearing HEENT:  Pupils equal round and reactive, fundi not visualized, oral mucosa unremarkable NECK:  No jugular venous distention, waveform within normal limits, carotid upstroke brisk and symmetric, no bruits, no thyromegaly LYMPHATICS:  No cervical, inguinal adenopathy LUNGS:  Clear to auscultation bilaterally BACK:  No CVA  tenderness CHEST:  Unremarkable HEART:  PMI not displaced or sustained,S1 and S2 within normal limits, no S3, no S4, no clicks, no rubs, no murmurs ABD:  Flat, positive bowel sounds normal in frequency in pitch, no bruits, no rebound, no guarding, no midline pulsatile mass, no hepatomegaly, no splenomegaly EXT:  2 plus pulses throughout, no edema, no cyanosis no clubbing SKIN:  No rashes no nodules NEURO:  Cranial nerves II through XII grossly intact, motor grossly intact throughout PSYCH:  Cognitively intact, oriented to person place and time    EKG:  EKG is ordered today. The ekg ordered today demonstrates sinus rhythm, rate 92, axis within normal limits, intervals within normal limits, no acute ST-T wave changes.   Recent Labs: 05/12/2020: B Natriuretic Peptide 49.4 05/14/2020: Magnesium 1.9 06/25/2020: TSH 3.534 07/28/2020: ALT 120; BUN 4; Creatinine 0.75; Hemoglobin 12.0; Platelet Count 208; Potassium 4.2; Sodium 140    Lipid Panel    Component Value Date/Time   CHOL 186 10/17/2012 1038   TRIG 115.0 10/17/2012 1038   HDL 72.00 10/17/2012 1038   CHOLHDL 3 10/17/2012 1038   VLDL 23.0 10/17/2012 1038   LDLCALC 91 10/17/2012 1038      Wt Readings from Last 3 Encounters:  09/03/20 (!) 337 lb 6.4 oz (153 kg)  07/28/20 (!) 351 lb 4.8 oz (159.3 kg)  06/25/20 (!) 343 lb 6.4 oz (155.8 kg)      Other studies Reviewed: Additional studies/ records that were reviewed today include: Hospital records. Review of the above records demonstrates:  Please see elsewhere in the note.     ASSESSMENT AND PLAN:  EDEMA: I think this is multifactorial.  There is currently some relationship to undiagnosed sleep apnea, and her morbid obesity, decreased mobility, venous insufficiency and possibly some lymphadenopathy.  I am going to start by adding spironolactone.  We talked about salt and fluid restriction, we talked about keeping the fluid elevated.  Weight loss is critical as below.   HTN:  I  am going to add spironolactone 50 mg daily and check a basic metabolic profile in about 1 week.  We will titrate her meds further.  She should keep a blood pressure diary.  I will stop her potassium.   MORBID OBESITY:  I will refer her to [facility].      Current medicines are reviewed at length with the patient today.  The patient does not have concerns regarding medicines.  The following changes have been made:  As above  Labs/ tests ordered today include:   Orders Placed This Encounter  Procedures   Basic metabolic panel   Ambulatory referral to [facility]   EKG 12-Lead  Disposition:   FU with APP in one month or so.      Signed, Minus Breeding, MD  09/03/2020 9:29 AM    [facility]

## 2020-09-03 ENCOUNTER — Other Ambulatory Visit: Payer: Self-pay

## 2020-09-03 ENCOUNTER — Encounter: Payer: Self-pay | Admitting: *Deleted

## 2020-09-03 ENCOUNTER — Telehealth: Payer: Self-pay | Admitting: *Deleted

## 2020-09-03 ENCOUNTER — Encounter: Payer: Self-pay | Admitting: Cardiology

## 2020-09-03 ENCOUNTER — Ambulatory Visit (INDEPENDENT_AMBULATORY_CARE_PROVIDER_SITE_OTHER): Payer: 59 | Admitting: Cardiology

## 2020-09-03 VITALS — BP 180/80 | HR 92 | Ht 65.0 in | Wt 337.4 lb

## 2020-09-03 DIAGNOSIS — M7989 Other specified soft tissue disorders: Secondary | ICD-10-CM

## 2020-09-03 DIAGNOSIS — I10 Essential (primary) hypertension: Secondary | ICD-10-CM | POA: Diagnosis not present

## 2020-09-03 DIAGNOSIS — Z5181 Encounter for therapeutic drug level monitoring: Secondary | ICD-10-CM | POA: Diagnosis not present

## 2020-09-03 MED ORDER — SPIRONOLACTONE 50 MG PO TABS: 50.0000 mg | ORAL_TABLET | Freq: Every day | ORAL | 1 refills | Status: DC

## 2020-09-03 NOTE — Telephone Encounter (Signed)
Prior Authorization for HST sent to [facility] via web portal. Tracking Number [id number].

## 2020-09-03 NOTE — Patient Instructions (Addendum)
Medication Instructions:  START SPIRONOLACTONE 50 MG DAILY   STOP YOUR POTASSIUM  *If you need a refill on your cardiac medications before your next appointment, please call your pharmacy*  Lab Work: BMET IN 1 WEEK   If you have labs (blood work) drawn today and your tests are completely normal, you will receive your results only by: [place] (if you have MyChart) OR A paper copy in the mail If you have any lab test that is abnormal or we need to change your treatment, we will call you to review the results.  Testing/Procedures: WANDA IS WORKING ON YOUR SLEEP STUDY   Follow-Up: At [facility], you and your health needs are our priority.  As part of our continuing mission to provide you with exceptional heart care, we have created designated Provider Care Teams.  These Care Teams include your primary Cardiologist (physician) and Advanced Practice Providers (APPs -  Physician Assistants and Nurse Practitioners) who all work together to provide you with the care you need, when you need it.  We recommend signing up for the patient portal called "MyChart".  Sign up information is provided on this After Visit Summary.  MyChart is used to connect with patients for Virtual Visits (Telemedicine).  Patients are able to view lab/test results, encounter notes, upcoming appointments, etc.  Non-urgent messages can be sent to your provider as well.   To learn more about what you can do with MyChart, go to [email or web address].    Your next appointment:   10/14/2020 AT 10:30 AM WITH DR [place] have been referred to    [place] [id number] Phone:[phone]  IF YOU DO NOT HEAR FROM THE OFFICE YOU CAN CALL THEM DIRECTLY AT THE NUMBER ABOVE   MONITOR AND LOG YOUR BLOOD PRESSURE DAILY, BRING READINGS TO YOUR FOLLOW UP

## 2020-09-09 ENCOUNTER — Telehealth: Payer: Self-pay | Admitting: *Deleted

## 2020-09-09 NOTE — Telephone Encounter (Signed)
LMTCB to discuss HST appointment details.

## 2020-09-09 NOTE — Telephone Encounter (Signed)
PA received from [facility] for HST. Auth # [id number]. Valid dates 09/26/20 to 10/27/20.

## 2020-09-21 ENCOUNTER — Encounter: Payer: Self-pay | Admitting: *Deleted

## 2020-09-22 LAB — BASIC METABOLIC PANEL
BUN/Creatinine Ratio: 16 (ref 9–23)
BUN: 11 mg/dL (ref 6–24)
CO2: 17 mmol/L — ABNORMAL LOW (ref 20–29)
Calcium: 9.4 mg/dL (ref 8.7–10.2)
Chloride: 95 mmol/L — ABNORMAL LOW (ref 96–106)
Creatinine, Ser: 0.67 mg/dL (ref 0.57–1.00)
Glucose: 113 mg/dL — ABNORMAL HIGH (ref 65–99)
Potassium: 4.9 mmol/L (ref 3.5–5.2)
Sodium: 136 mmol/L (ref 134–144)
eGFR: 110 mL/min/{1.73_m2} (ref >59)

## 2020-09-23 ENCOUNTER — Encounter: Payer: Self-pay | Admitting: *Deleted

## 2020-10-01 ENCOUNTER — Ambulatory Visit (HOSPITAL_BASED_OUTPATIENT_CLINIC_OR_DEPARTMENT_OTHER): Payer: 59 | Attending: Pulmonary Disease | Admitting: Cardiovascular Disease

## 2020-10-06 ENCOUNTER — Telehealth: Payer: Self-pay | Admitting: Cardiology

## 2020-10-06 NOTE — Telephone Encounter (Signed)
Pt is currently out of work as advised by Dr. Percival Spanish, for her cardiac condition and lower extremity edema. Pt was hospitalized back in March and a work note was written by Dr. Percival Spanish on 7/8, for the pt to remain out of work until 8/18.   Pt states her employer advised her to reach back out to Dr. Percival Spanish to request another letter of extension to be written, but in the letter include what he is treating the pt for and why she cannot return to work for this issue.  The letter should be an extension from previous return to work letter for 8/18.  Pt states she still cannot walk, and has to use a cane for assistance due to continuous lower extremity edema.  Pt states her legs are still very swollen and she still gets very sob with any exertion.  Pt states her job requires her to walk a long distances and walk up numerous stairs, which pt states she still cannot do. Pt states she is in the process of applying for long term disability, but this will take several months.  Pt states [employer] advised her to have Dr. Percival Spanish to write another work note in the meantime, reflecting as to why she cannot fulfill her work duties, due to her cardiac and physical limitations.  Pt request that once the letter is written, she would like this faxed to the number mentioned in this message.  She would also like a copy of this letter to be sent to her active mychart account.  Informed the pt that I will route this message to Dr. Percival Spanish to further review and advise on this matter, and a triage nurse will follow-up with her accordingly thereafter. Pt verbalized understanding and agrees with this plan.

## 2020-10-06 NOTE — Telephone Encounter (Signed)
Patient is following up--she stressed she would like to ensure that the new note highlights exactly how long she needs to be out of work.  See below.

## 2020-10-06 NOTE — Telephone Encounter (Signed)
Requesting another work note be sent to [employer] with what exactly was done due to them wanting her to be on disability. Fax number [phone].

## 2020-10-06 NOTE — Telephone Encounter (Signed)
Pt called back to endorse to Dr. Percival Spanish that he also needs to include exactly how long she should be out of work due to her cardiac condition, as well as all the other information she requested he place in the note, when she previously spoke with triage nursing this morning. This additional message was sent as an fyi to Dr. Percival Spanish and RN, by scheduler/operator.

## 2020-10-07 ENCOUNTER — Telehealth: Payer: Self-pay | Admitting: Cardiology

## 2020-10-07 NOTE — Telephone Encounter (Signed)
Returned call to patient, advised patient of the following regarding message received on Monday 8/9.   October 07, 2020 Minus Breeding, MD to Hannah Orr E     1:37 PM  She has hypertension and edema.  I cannot comment on either of these issues keeping her out of work unless her edema is causing such a mobility issue that she cannot work.  She probably then should have that assessed by her primary care to determine if she needs to see PT to address this and her disability from this.  Her HTN should not keep her out of work.   Patient states that Dr. Percival Spanish placed her out of work over a month ago due to her swelling in her lower extremities. Patient states she still has the swelling and cannot get her shoes on. Patient states "how am I supposed to work without being able to walk or wear shoes". Advised patient of Dr. Rosezella Florida recommendation of being assessed by  her PCP regarding this and if she needs PT. Patient is reluctant to this. Patient states that she also does not have insurance because she can't work therefore she can't afford sleep study, and states that she cannot afford to see healthy weight and wellness as well due to paying for everything out of pocket. Patient also reports that she is having "panic attacks" due to the stress of this. Patient states that she would like this message sent to Dr. Percival Spanish to see what he advises on her getting a letter to stay out of work longer. Advised I would forward to him. Patient verbalized understanding.

## 2020-10-07 NOTE — Telephone Encounter (Signed)
The patient called to speak with a nurse about  about a letter that was talked about Tuesday with a nurse from [facility].

## 2020-10-08 NOTE — Telephone Encounter (Signed)
Spoke with pt, aware of dr hochrein's recommendations. She has a follow up appointment next week.

## 2020-10-08 NOTE — Telephone Encounter (Signed)
Patient has a follow up appointment next week to discuss

## 2020-10-13 NOTE — Progress Notes (Signed)
Cardiology Office Note   Date:  10/14/2020   ID:  Hannah Orr, DOB Jul 18, 1975, MRN [id number]  PCP:  Lucianne Lei, MD  Cardiologist:   None Referring:  Lucianne Lei, MD  Chief Complaint  Patient presents with   Edema       History of Present Illness: Hannah Orr is a 45 y.o. female who is referred by Lucianne Lei, MD for evaluation of edema and difficult to control HTN.   She was in the hospital in March for this.  She had IV diuresis.  I did review these records for this appointment.  An echocardiogram was basically unremarkable.  She was taken off of amlodipine because of the swelling and started on carvedilol.  She was to have a sleep study home test but she canceled this.  She is here for management of both issues.  I saw her previously for this.  At the last visit I added spironolactone.  She has been okay with that.  Her blood pressure has been a little bit better.  Her weight seems to be stable and the lower extremity swelling unchanged.  She has not had any new shortness of breath, PND or orthopnea.  Not having any new chest pressure, neck or arm discomfort.  She does have difficulty getting around and uses a cane because of this limited mobility.   Past Medical History:  Diagnosis Date   Depression    Hypertension     History reviewed. No pertinent surgical history.   Current Outpatient Medications  Medication Sig Dispense Refill   albuterol (PROVENTIL HFA;VENTOLIN HFA) 108 (90 Base) MCG/ACT inhaler Inhale 2 puffs into the lungs every 6 (six) hours as needed for wheezing. 1 Inhaler 0   Cholecalciferol (VITAMIN D3) 50 MCG (2000 UT) capsule Take 1 capsule (2,000 Units total) by mouth daily. 90 capsule 0   FLUoxetine (PROZAC) 40 MG capsule Take 40 mg by mouth daily.     folic acid (FOLVITE) 1 MG tablet Take 1 tablet (1 mg total) by mouth daily. 90 tablet 1   gabapentin (NEURONTIN) 300 MG capsule Take 300 mg by mouth at bedtime.     spironolactone  (ALDACTONE) 50 MG tablet Take 1 tablet (50 mg total) by mouth daily. 90 tablet 1   torsemide (DEMADEX) 20 MG tablet Take 2 tablets (40 mg total) by mouth daily. 180 tablet 3   vitamin B-12 (CYANOCOBALAMIN) 1000 MCG tablet Take 1 tablet (1,000 mcg total) by mouth daily. 90 tablet 1   carvedilol (COREG) 25 MG tablet Take 1 tablet (25 mg total) by mouth 2 (two) times daily with a meal. 180 tablet 3   Current Facility-Administered Medications  Medication Dose Route Frequency Provider Last Rate Last Admin   methylPREDNISolone acetate (DEPO-MEDROL) injection 80 mg  80 mg Intramuscular Once Nche, Charlene Brooke, NP        Allergies:   Benazepril hcl and Benazepril hcl    ROS:  Please see the history of present illness.   Otherwise, review of systems are positive for none.   All other systems are reviewed and negative.    PHYSICAL EXAM: VS:  BP (!) 154/81   Pulse 95   Ht '5\' 5"'$  (1.651 m)   Wt (!) 337 lb 12.8 oz (153.2 kg)   SpO2 95%   BMI 56.21 kg/m  , BMI Body mass index is 56.21 kg/m. GENERAL:  Well appearing NECK:  No jugular venous distention, waveform within normal limits, carotid upstroke brisk and  symmetric, no bruits, no thyromegaly LUNGS:  Clear to auscultation bilaterally CHEST:  Unremarkable HEART:  PMI not displaced or sustained,S1 and S2 within normal limits, no S3, no S4, no clicks, no rubs, no murmurs ABD:  Flat, positive bowel sounds normal in frequency in pitch, no bruits, no rebound, no guarding, no midline pulsatile mass, no hepatomegaly, no splenomegaly EXT:  2 plus pulses throughout, severe bilateral lower extremity edema, no cyanosis no clubbing    EKG:  EKG is not ordered today.    Recent Labs: 05/12/2020: B Natriuretic Peptide 49.4 05/14/2020: Magnesium 1.9 06/25/2020: TSH 3.534 07/28/2020: ALT 120; Hemoglobin 12.0; Platelet Count 208 09/21/2020: BUN 11; Creatinine, Ser 0.67; Potassium 4.9; Sodium 136    Lipid Panel    Component Value Date/Time   CHOL 186  10/17/2012 1038   TRIG 115.0 10/17/2012 1038   HDL 72.00 10/17/2012 1038   CHOLHDL 3 10/17/2012 1038   VLDL 23.0 10/17/2012 1038   LDLCALC 91 10/17/2012 1038      Wt Readings from Last 3 Encounters:  10/14/20 (!) 337 lb 12.8 oz (153.2 kg)  09/03/20 (!) 337 lb 6.4 oz (153 kg)  07/28/20 (!) 351 lb 4.8 oz (159.3 kg)      Other studies Reviewed: Additional studies/ records that were reviewed today include: None  Review of the above records demonstrates:  Please see elsewhere in the note.     ASSESSMENT AND PLAN:  EDEMA:    I am going to stop the Lasix and start torsemide 40 mg daily.  She will get a basic metabolic profile in 10 days.  He understands salt and fluid restriction.  Of note I do not think that this is lymphedema so I would not suggest a PT referral.  HTN: Not at target.  I will be made change as above and also I will increase her carvedilol to 25 mg twice daily.   MORBID OBESITY:   We called today again to make sure that our referral into [facility] is there and it is.  She is on the waiting list.  WORK: With her decreased mobility I did agree to write another work excuse until she is seen back by our APP.     Current medicines are reviewed at length with the patient today.  The patient does not have concerns regarding medicines.  The following changes have been made: As above  Labs/ tests ordered today include:   Orders Placed This Encounter  Procedures   Basic Metabolic Panel (BMET)     Disposition:   FU with APP in 4 weeks.   Signed, Minus Breeding, MD  10/14/2020 11:30 AM    [facility]

## 2020-10-14 ENCOUNTER — Encounter: Payer: Self-pay | Admitting: *Deleted

## 2020-10-14 ENCOUNTER — Ambulatory Visit (INDEPENDENT_AMBULATORY_CARE_PROVIDER_SITE_OTHER): Payer: 59 | Admitting: Cardiology

## 2020-10-14 ENCOUNTER — Encounter: Payer: Self-pay | Admitting: Cardiology

## 2020-10-14 ENCOUNTER — Other Ambulatory Visit: Payer: Self-pay

## 2020-10-14 VITALS — BP 154/81 | HR 95 | Ht 65.0 in | Wt 337.8 lb

## 2020-10-14 DIAGNOSIS — M7989 Other specified soft tissue disorders: Secondary | ICD-10-CM

## 2020-10-14 DIAGNOSIS — I10 Essential (primary) hypertension: Secondary | ICD-10-CM

## 2020-10-14 MED ORDER — CARVEDILOL 25 MG PO TABS: 25.0000 mg | ORAL_TABLET | Freq: Two times a day (BID) | ORAL | 3 refills | Status: DC

## 2020-10-14 MED ORDER — TORSEMIDE 20 MG PO TABS: 40.0000 mg | ORAL_TABLET | Freq: Every day | ORAL | 3 refills | Status: DC

## 2020-10-14 NOTE — Patient Instructions (Signed)
Medication Instructions:   STOP FUROSEMIDE  START TORSEMIDE 40 MG ONCE DAILY= 2 OF THE 20 MG TABLETS ONCE DAILY  INCREASE CARVEDILOL TO 25 MG TWICE DAILY= 2 OF THE 12.5 MG TABLETS TWICE DAILY  *If you need a refill on your cardiac medications before your next appointment, please call your pharmacy*   Lab Work:  Your physician recommends that you return for lab work in: 10 DAYS=AUGUST 29TH  If you have labs (blood work) drawn today and your tests are completely normal, you will receive your results only by: MyChart Message (if you have MyChart) OR A paper copy in the mail If you have any lab test that is abnormal or we need to change your treatment, we will call you to review the results.  Follow-Up: At [facility], you and your health needs are our priority.  As part of our continuing mission to provide you with exceptional heart care, we have created designated Provider Care Teams.  These Care Teams include your primary Cardiologist (physician) and Advanced Practice Providers (APPs -  Physician Assistants and Nurse Practitioners) who all work together to provide you with the care you need, when you need it.  We recommend signing up for the patient portal called "MyChart".  Sign up information is provided on this After Visit Summary.  MyChart is used to connect with patients for Virtual Visits (Telemedicine).  Patients are able to view lab/test results, encounter notes, upcoming appointments, etc.  Non-urgent messages can be sent to your provider as well.   To learn more about what you can do with MyChart, go to [email or web address].    Your next appointment:   1 month(s)  The format for your next appointment:   In Person  Provider:   You will see one of the following Advanced Practice Providers on your designated Care Team:   Rosaria Ferries, PA-C Caron Presume, PA-C Jory Sims, DNP, ANP

## 2020-10-28 ENCOUNTER — Ambulatory Visit: Payer: BC Managed Care – PPO | Admitting: Neurology

## 2020-11-08 ENCOUNTER — Encounter: Payer: Self-pay | Admitting: Family Medicine

## 2020-11-08 ENCOUNTER — Ambulatory Visit (INDEPENDENT_AMBULATORY_CARE_PROVIDER_SITE_OTHER): Payer: 59 | Admitting: Family Medicine

## 2020-11-08 ENCOUNTER — Other Ambulatory Visit: Payer: Self-pay

## 2020-11-08 VITALS — BP 148/96 | HR 92 | Temp 97.2°F | Ht 65.0 in | Wt 344.4 lb

## 2020-11-08 DIAGNOSIS — Z6841 Body Mass Index (BMI) 40.0 and over, adult: Secondary | ICD-10-CM

## 2020-11-08 DIAGNOSIS — R600 Localized edema: Secondary | ICD-10-CM

## 2020-11-08 DIAGNOSIS — E039 Hypothyroidism, unspecified: Secondary | ICD-10-CM | POA: Diagnosis not present

## 2020-11-08 DIAGNOSIS — E559 Vitamin D deficiency, unspecified: Secondary | ICD-10-CM | POA: Insufficient documentation

## 2020-11-08 DIAGNOSIS — Z0000 Encounter for general adult medical examination without abnormal findings: Secondary | ICD-10-CM

## 2020-11-08 DIAGNOSIS — K760 Fatty (change of) liver, not elsewhere classified: Secondary | ICD-10-CM

## 2020-11-08 DIAGNOSIS — D890 Polyclonal hypergammaglobulinemia: Secondary | ICD-10-CM

## 2020-11-08 DIAGNOSIS — Z23 Encounter for immunization: Secondary | ICD-10-CM | POA: Diagnosis not present

## 2020-11-08 DIAGNOSIS — I10 Essential (primary) hypertension: Secondary | ICD-10-CM

## 2020-11-08 LAB — HEPATIC FUNCTION PANEL
ALT: 31 U/L (ref 0–35)
AST: 53 U/L — ABNORMAL HIGH (ref 0–37)
Albumin: 4.1 g/dL (ref 3.5–5.2)
Alkaline Phosphatase: 65 U/L (ref 39–117)
Bilirubin, Direct: 0.2 mg/dL (ref 0.0–0.3)
Total Bilirubin: 0.9 mg/dL (ref 0.2–1.2)
Total Protein: 8.1 g/dL (ref 6.0–8.3)

## 2020-11-08 LAB — BASIC METABOLIC PANEL
BUN: 9 mg/dL (ref 6–23)
CO2: 26 mEq/L (ref 19–32)
Calcium: 9.6 mg/dL (ref 8.4–10.5)
Chloride: 98 mEq/L (ref 96–112)
Creatinine, Ser: 0.79 mg/dL (ref 0.40–1.20)
GFR: 90.44 mL/min (ref >60.00)
Glucose, Bld: 96 mg/dL (ref 70–99)
Potassium: 4.6 mEq/L (ref 3.5–5.1)
Sodium: 136 mEq/L (ref 135–145)

## 2020-11-08 LAB — URINALYSIS, ROUTINE W REFLEX MICROSCOPIC
Bilirubin Urine: NEGATIVE
Hgb urine dipstick: NEGATIVE
Ketones, ur: NEGATIVE
Leukocytes,Ua: NEGATIVE
Nitrite: NEGATIVE
RBC / HPF: NONE SEEN (ref 0–?)
Specific Gravity, Urine: 1.015 (ref 1.000–1.030)
Total Protein, Urine: NEGATIVE
Urine Glucose: NEGATIVE
Urobilinogen, UA: 1 (ref 0.0–1.0)
pH: 7.5 (ref 5.0–8.0)

## 2020-11-08 LAB — CBC
HCT: 37.9 % (ref 36.0–46.0)
Hemoglobin: 12.2 g/dL (ref 12.0–15.0)
MCHC: 32.2 g/dL (ref 30.0–36.0)
MCV: 108.8 fl — ABNORMAL HIGH (ref 78.0–100.0)
Platelets: 347 10*3/uL (ref 150.0–400.0)
RBC: 3.48 Mil/uL — ABNORMAL LOW (ref 3.87–5.11)
RDW: 14.6 % (ref 11.5–15.5)
WBC: 2.6 10*3/uL — ABNORMAL LOW (ref 4.0–10.5)

## 2020-11-08 LAB — LIPID PANEL
Cholesterol: 228 mg/dL — ABNORMAL HIGH (ref 0–200)
HDL: 78 mg/dL (ref >39.00)
LDL Cholesterol: 138 mg/dL — ABNORMAL HIGH (ref 0–99)
NonHDL: 149.79
Total CHOL/HDL Ratio: 3
Triglycerides: 59 mg/dL (ref 0.0–149.0)
VLDL: 11.8 mg/dL (ref 0.0–40.0)

## 2020-11-08 LAB — VITAMIN D 25 HYDROXY (VIT D DEFICIENCY, FRACTURES): VITD: 24.12 ng/mL — ABNORMAL LOW (ref 30.00–100.00)

## 2020-11-08 LAB — TSH: TSH: 5.29 u[IU]/mL (ref 0.35–5.50)

## 2020-11-08 LAB — LDL CHOLESTEROL, DIRECT: Direct LDL: 132 mg/dL

## 2020-11-08 LAB — MICROALBUMIN / CREATININE URINE RATIO
Creatinine,U: 116.5 mg/dL
Microalb Creat Ratio: 0.6 mg/g (ref 0.0–30.0)
Microalb, Ur: <0.7 mg/dL (ref 0.0–1.9)

## 2020-11-08 MED ORDER — AMLODIPINE BESYLATE 5 MG PO TABS: 5.0000 mg | ORAL_TABLET | Freq: Every day | ORAL | 0 refills | Status: DC

## 2020-11-08 NOTE — Progress Notes (Addendum)
New Patient Office Visit  Subjective:  Patient ID: Hannah Orr, female    DOB: 10-10-1975  Age: 45 y.o. MRN: [id number]  CC:  Chief Complaint  Patient presents with   Establish Care    NP/establish care, concerns about swollen legs x 6 months. Patient fasting.     HPI Hannah Orr presents for establishment of care and follow-up for medical medical issues to include hypertension, lower extremity edema, history of CHF, hepatic steatosis with elevated liver enzymes, hypothyroidism, depression, vitamin D deficiency and morbid obesity.  Blood pressure typically runs in the 140s over 80s.  Recently switched to [facility].  Has been taking spironolactone.  Recent echocardiogram was essentially normal.  History of hepatic steatosis with elevated liver enzymes.  Renal function has been normal.  Lower extremity edema has been uncomfortable for her.  She is unable to wear compression stockings because they do not fit well.  She is out of work right now because it is difficult for her to stand.  She has been taking Prozac 40 mg for depression and anxiety.  She continues to worry because of her work situation.  Past Medical History:  Diagnosis Date   Depression    Hypertension     Past Surgical History:  Procedure Laterality Date   MANDIBLE SURGERY Right 12/11/1993    Family History  Problem Relation Age of Onset   Hypertension Mother    Breast cancer Mother    Hypertension Father    Prostate cancer Father    Cancer Neg Hx    Heart disease Neg Hx    Hyperlipidemia Neg Hx    Kidney disease Neg Hx    Diabetes Neg Hx    Early death Neg Hx    Hearing loss Neg Hx    Stroke Neg Hx     Social History   Socioeconomic History   Marital status: Single    Spouse name: Not on file   Number of children: Not on file   Years of education: Not on file   Highest education level: Not on file  Occupational History   Not on file  Tobacco Use   Smoking status: Never   Smokeless  tobacco: Never  Vaping Use   Vaping Use: Never used  Substance and Sexual Activity   Alcohol use: Not Currently    Comment: Stopped drinking December 2021.    Drug use: No   Sexual activity: Yes    Birth control/protection: Condom  Other Topics Concern   Not on file  Social History Narrative   Lives at home with mom.   Social Determinants of Health   Financial Resource Strain: Not on file  Food Insecurity: Not on file  Transportation Needs: Not on file  Physical Activity: Not on file  Stress: Not on file  Social Connections: Not on file  Intimate Partner Violence: Not on file    ROS Review of Systems  Constitutional:  Negative for diaphoresis, fatigue, fever and unexpected weight change.  HENT: Negative.    Eyes:  Negative for photophobia and visual disturbance.  Respiratory:  Negative for chest tightness and stridor.   Cardiovascular:  Positive for leg swelling. Negative for chest pain.  Gastrointestinal: Negative.   Endocrine: Negative for polyphagia and polyuria.  Genitourinary: Negative.   Musculoskeletal:  Positive for gait problem.  Skin:  Negative for color change and pallor.  Neurological:  Negative for speech difficulty and weakness.   Objective:   Today's Vitals: BP (!) 148/96 (BP  Location: Right Arm, Patient Position: Sitting, Cuff Size: Large)   Pulse 92   Temp (!) 97.2 F (36.2 C) (Temporal)   Ht '5\' 5"'$  (1.651 m)   Wt (!) 344 lb 6.4 oz (156.2 kg)   SpO2 94%   BMI 57.31 kg/m   Physical Exam  Assessment & Plan:   Problem List Items Addressed This Visit       Cardiovascular and Mediastinum   Essential hypertension - Primary   Relevant Medications   amLODipine (NORVASC) 5 MG tablet   Other Relevant Orders   Basic metabolic panel (Completed)   Urinalysis, Routine w reflex microscopic (Completed)   Microalbumin / creatinine urine ratio (Completed)     Digestive   Hepatic steatosis   Relevant Orders   LDL cholesterol, direct (Completed)    Lipid panel (Completed)   Hepatic function panel (Completed)     Endocrine   Acquired hypothyroidism   Relevant Medications   EUTHYROX 100 MCG tablet   Other Relevant Orders   Hepatic function panel (Completed)   TSH (Completed)     Other   Healthcare maintenance   Relevant Orders   LDL cholesterol, direct (Completed)   Lipid panel (Completed)   Flu Vaccine QUAD 6+ mos PF IM (Fluarix Quad PF) (Completed)   Morbid obesity with BMI of 50.0-59.9, adult (HCC)   Relevant Orders   Ambulatory referral to General Surgery   Polyclonal gammopathy   Relevant Orders   CBC (Completed)   Bilateral lower extremity edema   Relevant Orders   Ambulatory referral to Vascular Surgery   Vitamin D deficiency   Relevant Medications   Cholecalciferol (VITAMIN D3) 125 MCG (5000 UT) CAPS   Other Relevant Orders   VITAMIN D 25 Hydroxy (Vit-D Deficiency, Fractures) (Completed)    Outpatient Encounter Medications as of 11/08/2020  Medication Sig   amLODipine (NORVASC) 5 MG tablet Take 1 tablet (5 mg total) by mouth daily.   carvedilol (COREG) 25 MG tablet Take 1 tablet (25 mg total) by mouth 2 (two) times daily with a meal.   Cholecalciferol (VITAMIN D3) 125 MCG (5000 UT) CAPS Take 1 capsule (5,000 Units total) by mouth daily.   EUTHYROX 100 MCG tablet Take 100 mcg by mouth daily.   FLUoxetine (PROZAC) 40 MG capsule Take 40 mg by mouth daily.   folic acid (FOLVITE) 1 MG tablet Take 1 tablet (1 mg total) by mouth daily.   gabapentin (NEURONTIN) 300 MG capsule Take 300 mg by mouth at bedtime.   spironolactone (ALDACTONE) 50 MG tablet Take 1 tablet (50 mg total) by mouth daily.   torsemide (DEMADEX) 20 MG tablet Take 2 tablets (40 mg total) by mouth daily.   vitamin B-12 (CYANOCOBALAMIN) 1000 MCG tablet Take 1 tablet (1,000 mcg total) by mouth daily.   [DISCONTINUED] albuterol (PROVENTIL HFA;VENTOLIN HFA) 108 (90 Base) MCG/ACT inhaler Inhale 2 puffs into the lungs every 6 (six) hours as needed for  wheezing.   [DISCONTINUED] Cholecalciferol (VITAMIN D3) 50 MCG (2000 UT) capsule Take 1 capsule (2,000 Units total) by mouth daily.   Facility-Administered Encounter Medications as of 11/08/2020  Medication   methylPREDNISolone acetate (DEPO-MEDROL) injection 80 mg    Follow-up: Return in about 3 months (around 02/07/2021).  Have added amlodipine for further blood pressure control.  Realize that he could add to her lower extremity swelling but will follow.  Hepatic steatosis with significantly elevated liver enzymes is a concern.  Could be contributing to her lower extremity edema.  She is on  maximal medical therapy for her lower extremity edema.  Believe that she would qualify for bariatric surgery.  We will consider GI consultation for her steatosis.  Vascular and vein specialist consultation for help with lower extremity edema.  Libby Maw, MD

## 2020-11-09 ENCOUNTER — Other Ambulatory Visit: Payer: Self-pay

## 2020-11-09 DIAGNOSIS — M7989 Other specified soft tissue disorders: Secondary | ICD-10-CM

## 2020-11-09 MED ORDER — VITAMIN D3 125 MCG (5000 UT) PO CAPS: 5000.0000 [IU] | ORAL_CAPSULE | Freq: Every day | ORAL | 1 refills | Status: DC

## 2020-11-09 NOTE — Addendum Note (Signed)
Addended by: Jon Billings on: 11/09/2020 07:50 AM   Modules accepted: Orders

## 2020-11-10 ENCOUNTER — Other Ambulatory Visit: Payer: Self-pay

## 2020-11-10 ENCOUNTER — Ambulatory Visit (HOSPITAL_COMMUNITY)
Admission: RE | Admit: 2020-11-10 | Discharge: 2020-11-10 | Disposition: A | Discharge status: Home / Self Care | Payer: 59 | Source: Ambulatory Visit | Attending: Vascular Surgery | Admitting: Vascular Surgery

## 2020-11-10 ENCOUNTER — Encounter: Payer: Self-pay | Admitting: Vascular Surgery

## 2020-11-10 DIAGNOSIS — M7989 Other specified soft tissue disorders: Secondary | ICD-10-CM | POA: Diagnosis not present

## 2020-11-12 ENCOUNTER — Telehealth: Payer: Self-pay | Admitting: Family Medicine

## 2020-11-12 NOTE — Telephone Encounter (Signed)
Please advise message below  °

## 2020-11-15 NOTE — Telephone Encounter (Signed)
Called to inform patient that she will need to be seen for concerns that she has about swelling in her legs and medication that she was put on. No answer LMTCB and schedule appointment.

## 2020-11-15 NOTE — Progress Notes (Signed)
[facility] [place]  ASSESSMENT / PLAN: [name] is a 45 y.o. morbidly obese female with lymphedema and chronic venous insufficiency of bilateral lower extremities.  Her weight is the main contributing factor to her swelling and makes her a poor candidate for intervention.  Will refer to bariatric surgery.  Recommend compression / elevation for symptomatic relief.  Follow up with me as needed.  CHIEF COMPLAINT: leg swelling  HISTORY OF PRESENT ILLNESS: Hannah Orr is a 45 y.o. female who presents to clinic for evaluation of bilateral lower extremity swelling.  The patient is morbidly obese and has had increasing difficulty over the past several months ambulating.  She is unsteady on her feet and reports occasional falls at home.  She has to ambulate with a cane.  She has significant discomfort in her bilateral calves, ankles, and feet.  She cannot find shoes that fit her appropriately.  She has not been able to work because of her symptoms.  Past Medical History:  Diagnosis Date   Depression    Hypertension     Past Surgical History:  Procedure Laterality Date   MANDIBLE SURGERY Right 12/11/1993    Family History  Problem Relation Age of Onset   Hypertension Mother    Breast cancer Mother    Hypertension Father    Prostate cancer Father    Cancer Neg Hx    Heart disease Neg Hx    Hyperlipidemia Neg Hx    Kidney disease Neg Hx    Diabetes Neg Hx    Early death Neg Hx    Hearing loss Neg Hx    Stroke Neg Hx     Social History   Socioeconomic History   Marital status: Single    Spouse name: Not on file   Number of children: Not on file   Years of education: Not on file   Highest education level: Not on file  Occupational History   Not on file  Tobacco Use   Smoking status: Never   Smokeless tobacco: Never  Vaping Use   Vaping Use: Never used  Substance and Sexual Activity   Alcohol use: Not Currently    Comment: Stopped  drinking December 2021.    Drug use: No   Sexual activity: Yes    Birth control/protection: Condom  Other Topics Concern   Not on file  Social History Narrative   Lives at home with mom.   Social Determinants of Health   Financial Resource Strain: Not on file  Food Insecurity: Not on file  Transportation Needs: Not on file  Physical Activity: Not on file  Stress: Not on file  Social Connections: Not on file  Intimate Partner Violence: Not on file    Allergies  Allergen Reactions   Benazepril Hcl Hypertension    cough   Benazepril Hcl     cough    Current Outpatient Medications  Medication Sig Dispense Refill   amLODipine (NORVASC) 5 MG tablet Take 1 tablet (5 mg total) by mouth daily. 90 tablet 0   carvedilol (COREG) 25 MG tablet Take 1 tablet (25 mg total) by mouth 2 (two) times daily with a meal. 180 tablet 3   Cholecalciferol (VITAMIN D3) 125 MCG (5000 UT) CAPS Take 1 capsule (5,000 Units total) by mouth daily. 90 capsule 1   EUTHYROX 100 MCG tablet Take 100 mcg by mouth daily.     FLUoxetine (PROZAC) 40 MG capsule Take 40 mg by mouth daily.  folic acid (FOLVITE) 1 MG tablet Take 1 tablet (1 mg total) by mouth daily. 90 tablet 1   gabapentin (NEURONTIN) 300 MG capsule Take 300 mg by mouth at bedtime.     spironolactone (ALDACTONE) 50 MG tablet Take 1 tablet (50 mg total) by mouth daily. 90 tablet 1   torsemide (DEMADEX) 20 MG tablet Take 2 tablets (40 mg total) by mouth daily. 180 tablet 3   vitamin B-12 (CYANOCOBALAMIN) 1000 MCG tablet Take 1 tablet (1,000 mcg total) by mouth daily. 90 tablet 1   No current facility-administered medications for this visit.    REVIEW OF SYSTEMS:  '[X]'$  denotes positive finding, '[ ]'$  denotes negative finding Cardiac  Comments:  Chest pain or chest pressure:    Shortness of breath upon exertion:    Short of breath when lying flat:    Irregular heart rhythm:        Vascular    Pain in calf, thigh, or hip brought on by ambulation:  x   Pain in feet at night that wakes you up from your sleep:  x   Blood clot in your veins:    Leg swelling:  x       Pulmonary    Oxygen at home:    Productive cough:     Wheezing:         Neurologic    Sudden weakness in arms or legs:     Sudden numbness in arms or legs:  x   Sudden onset of difficulty speaking or slurred speech:    Temporary loss of vision in one eye:     Problems with dizziness:         Gastrointestinal    Blood in stool:     Vomited blood:  x       Genitourinary    Burning when urinating:     Blood in urine:        Psychiatric    Major depression:         Hematologic    Bleeding problems:    Problems with blood clotting too easily:        Skin    Rashes or ulcers:        Constitutional    Fever or chills:      PHYSICAL EXAM Vitals:   11/16/20 0833  BP: (!) 177/95  Pulse: 85  Resp: 20  Temp: 98.1 F (36.7 C)  SpO2: 95%  Weight: (!) 347 lb (157.4 kg)  Height: '5\' 5"'$  (1.651 m)    Constitutional: Morbidly obese. No distress.  Neurologic: CN intact. no focal findings. no sensory loss. Psychiatric:  Mood and affect symmetric and appropriate. Eyes:  No icterus. No conjunctival pallor. Ears, nose, throat:  mucous membranes moist. Midline trachea.  Cardiac: regular rate and rhythm.  Respiratory:  unlabored. Abdominal: morbidly obese, soft, non-tender, non-distended.  Peripheral vascular: no palpable pedal pulses 2/2 edema. Feet are warm and well perfused. Extremity: Mixed venous and lymphatic edema bilateral lower extremities (toes to knees). No cyanosis. No pallor.  Skin: No gangrene. No ulceration.  Lymphatic: Positive Stemmer's sign. No palpable lymphadenopathy.  PERTINENT LABORATORY AND RADIOLOGIC DATA  Most recent CBC CBC Latest Ref Rng & Units 11/08/2020 07/28/2020 06/25/2020  WBC 4.0 - 10.5 K/uL 2.6(L) 1.9(L) 2.2(L)  Hemoglobin 12.0 - 15.0 g/dL 12.2 12.0 12.4  Hematocrit 36.0 - 46.0 % 37.9 35.8(L) 38.4  Platelets 150.0 - 400.0  K/uL 347.0 208 243     Most recent CMP CMP  Latest Ref Rng & Units 11/08/2020 09/21/2020 07/28/2020  Glucose 70 - 99 mg/dL 96 113(H) 89  BUN 6 - 23 mg/dL 9 11 4(L)  Creatinine 0.40 - 1.20 mg/dL 0.79 0.67 0.75  Sodium 135 - 145 mEq/L 136 136 140  Potassium 3.5 - 5.1 mEq/L 4.6 4.9 4.2  Chloride 96 - 112 mEq/L 98 95(L) 102  CO2 19 - 32 mEq/L 26 17(L) 24  Calcium 8.4 - 10.5 mg/dL 9.6 9.4 9.2  Total Protein 6.0 - 8.3 g/dL 8.1 - 9.0(H)  Total Bilirubin 0.2 - 1.2 mg/dL 0.9 - 1.0  Alkaline Phos 39 - 117 U/L 65 - 82  AST 0 - 37 U/L 53(H) - 295(HH)  ALT 0 - 35 U/L 31 - 120(H)    Renal function Estimated Creatinine Clearance: 136.3 mL/min (by C-G formula based on SCr of 0.79 mg/dL).  Hgb A1c MFr Bld (%)  Date Value  08/08/2013 5.3    LDL Cholesterol  Date Value Ref Range Status  11/08/2020 138 (H) 0 - 99 mg/dL Final   Direct LDL  Date Value Ref Range Status  11/08/2020 132.0 mg/dL Final    Comment:    Optimal:  <100 mg/dLNear or Above Optimal:  100-129 mg/dLBorderline High:  130-159 mg/dLHigh:  160-189 mg/dLVery High:  >190 mg/dL     Lower Venous Reflux Study   Patient Name:  VALLA SUI  Date of Exam:   11/10/2020  Medical Rec #: [id number]          Accession #:    [id number]  Date of Birth: 05-18-1975          Patient Gender: F  Patient Age:   31 years  Exam Location:  [facility]  Procedure:      VAS [place] LOWER EXTREMITY VENOUS REFLUX  Referring Phys: Marcello Moores Rheta Hemmelgarn    ---------------------------------------------------------------------------  -----     Indications: LLE swelling.     Limitations: Body habitus.  Performing Technologist: June Leap RDMS, RVT      Examination Guidelines: A complete evaluation includes B-mode imaging,  spectral  Doppler, color Doppler, and power Doppler as needed of all accessible  portions  of each vessel. Bilateral testing is considered an integral part of a  complete  examination. Limited examinations for  reoccurring indications may be  performed  as noted. The reflux portion of the exam is performed with the patient in  reverse Trendelenburg.  Significant venous reflux is defined as >500 ms in the superficial venous  system, and >1 second in the deep venous system.      +--------------+---------+------+-----------+------------+--------+  LEFT          Reflux NoRefluxReflux TimeDiameter cmsComments                          Yes                                   +--------------+---------+------+-----------+------------+--------+  CFV           no                                              +--------------+---------+------+-----------+------------+--------+  FV mid        no                                              +--------------+---------+------+-----------+------------+--------+  Popliteal     no                                              +--------------+---------+------+-----------+------------+--------+  GSV at [facility]              yes    >500 ms      0.7               +--------------+---------+------+-----------+------------+--------+  GSV prox thighno                            0.65              +--------------+---------+------+-----------+------------+--------+  GSV mid thigh no                            0.63              +--------------+---------+------+-----------+------------+--------+  GSV dist thigh                              0.36              +--------------+---------+------+-----------+------------+--------+  GSV at knee             yes    >500 ms      0.36              +--------------+---------+------+-----------+------------+--------+  GSV prox calf no                            0.39              +--------------+---------+------+-----------+------------+--------+  SSV Pop Fossa           yes                 0.47               +--------------+---------+------+-----------+------------+--------+  SSV prox calf           yes                 0.47              +--------------+---------+------+-----------+------------+--------+  SSV mid calf            yes                 0.56              +--------------+---------+------+-----------+------------+--------+           Summary:  Left:  - No evidence of superficial venous thrombosis in the left lower  extremity.  - No obvious evidence of DVT.  - Venous reflux is noted in the left sapheno-femoral junction.  - Venous reflux is noted in the left GSV, isolated at the knee.      *See table(s) above for measurements and observations.   Electronically signed by Jamelle Haring on 11/11/2020 at 1:29:13 PM.   Yevonne Aline. Stanford Breed, MD [facility] Phone Number: [phone] 11/16/2020 10:55 AM  Total time spent on preparing this encounter including chart review, data review, collecting history, examining the patient, coordinating care for this new patient, 60 minutes.  Portions of this report  may have been transcribed using voice recognition software.  Every effort has been made to ensure accuracy; however, inadvertent computerized transcription errors may still be present.

## 2020-11-16 ENCOUNTER — Other Ambulatory Visit: Payer: Self-pay

## 2020-11-16 ENCOUNTER — Encounter: Payer: Self-pay | Admitting: Vascular Surgery

## 2020-11-16 ENCOUNTER — Ambulatory Visit (INDEPENDENT_AMBULATORY_CARE_PROVIDER_SITE_OTHER): Payer: 59 | Admitting: Vascular Surgery

## 2020-11-16 ENCOUNTER — Telehealth: Payer: Self-pay

## 2020-11-16 ENCOUNTER — Encounter: Payer: Self-pay | Admitting: *Deleted

## 2020-11-16 VITALS — BP 177/95 | HR 85 | Temp 98.1°F | Resp 20 | Ht 65.0 in | Wt 347.0 lb

## 2020-11-16 DIAGNOSIS — I890 Lymphedema, not elsewhere classified: Secondary | ICD-10-CM

## 2020-11-16 DIAGNOSIS — I872 Venous insufficiency (chronic) (peripheral): Secondary | ICD-10-CM | POA: Diagnosis not present

## 2020-11-16 NOTE — Telephone Encounter (Signed)
Pt called after seeing MD today with questions about wrapping leg with compression ACE wrap. MD explained how to do this at appt and I did as well on phone. Pt prefers to have return to office and have a nurse show her. She is expected to return to office for this today.

## 2020-11-16 NOTE — Progress Notes (Signed)
Patient was seen this morning by Dr. Stanford Breed and called in by phone mid-day and spoke to [facility] RN about difficulty wrapping her leg with Ace wraps.  Hannah Orr tried to walk her verbally through process by phone and referred her to [employer] site which demonstrated how to wrap legs with Ace bandages.  Patient  tried to wrap her legs using Ace bandages but requests to come in for additional assistance.  3:00 PM Patient arrives.  She has wrapped her calf but did not wrap her foot.  Applied Ace wraps to bilateral lower extremities and feet while giving instructions.  Reviewed verbally how to apply Ace bandages to bilateral lower extremities and feet.  Answered all patients questions.  Gave her additional Ace bandages.  Patient inquired about short-term and long-term disability and referred her to her primary care physician.  Reviewed Dr. Mora Appl recommendations with patient for elevation of legs, Ace Wrap compression of bilateral lower extremities, and referral for bariatric surgery.

## 2020-11-17 ENCOUNTER — Telehealth: Payer: Self-pay | Admitting: *Deleted

## 2020-11-17 NOTE — Progress Notes (Deleted)
Cardiology Office Note   Date:  11/17/2020   ID:  Hannah Orr, DOB 1975-09-03, MRN [id number]  PCP:  Libby Maw, MD  Cardiologist: Dr. Percival Spanish  No chief complaint on file.    History of Present Illness: Hannah Orr is a 45 y.o. female who presents for ongoing assessment and management of difficult to control hypertension, and chronic edema.  She was hospitalized for the same symptoms in March 2022.  Echocardiogram did not reveal any cardiomyopathy.  She was taken off of amlodipine because of the lower extremity edema started on carvedilol.  She was to be tested for OSA but she canceled the test.  When seen last by Dr. Percival Spanish on 10/14/2020 the patient was doing okay, her lower extremity edema was unchanged.  She denied any new symptoms.  Did complain of trouble getting around because of her limited mobility.  Dr. Percival Spanish stopped the Lasix and started torsemide 40 mg daily, with follow-up BMET.  She was given education on salt and fluid restrictions.  Blood pressure was not at goal and therefore carvedilol was increased to 25 mg twice daily, she was continued on spironolactone.  She was also given a work excuse because of her limited mobility until followed up with me today.    Past Medical History:  Diagnosis Date   Depression    Hypertension     Past Surgical History:  Procedure Laterality Date   MANDIBLE SURGERY Right 12/11/1993     Current Outpatient Medications  Medication Sig Dispense Refill   amLODipine (NORVASC) 5 MG tablet Take 1 tablet (5 mg total) by mouth daily. 90 tablet 0   carvedilol (COREG) 25 MG tablet Take 1 tablet (25 mg total) by mouth 2 (two) times daily with a meal. 180 tablet 3   Cholecalciferol (VITAMIN D3) 125 MCG (5000 UT) CAPS Take 1 capsule (5,000 Units total) by mouth daily. 90 capsule 1   EUTHYROX 100 MCG tablet Take 100 mcg by mouth daily.     FLUoxetine (PROZAC) 40 MG capsule Take 40 mg by mouth daily.     folic acid  (FOLVITE) 1 MG tablet Take 1 tablet (1 mg total) by mouth daily. 90 tablet 1   gabapentin (NEURONTIN) 300 MG capsule Take 300 mg by mouth at bedtime.     spironolactone (ALDACTONE) 50 MG tablet Take 1 tablet (50 mg total) by mouth daily. 90 tablet 1   torsemide (DEMADEX) 20 MG tablet Take 2 tablets (40 mg total) by mouth daily. 180 tablet 3   vitamin B-12 (CYANOCOBALAMIN) 1000 MCG tablet Take 1 tablet (1,000 mcg total) by mouth daily. 90 tablet 1   No current facility-administered medications for this visit.    Allergies:   Benazepril hcl and Benazepril hcl    Social History:  The patient  reports that she has never smoked. She has never used smokeless tobacco. She reports that she does not currently use alcohol. She reports that she does not use drugs.   Family History:  The patient's family history includes Breast cancer in her mother; Hypertension in her father and mother; Prostate cancer in her father.    ROS: All other systems are reviewed and negative. Unless otherwise mentioned in H&P    PHYSICAL EXAM: VS:  There were no vitals taken for this visit. , BMI There is no height or weight on file to calculate BMI. GEN: Well nourished, well developed, in no acute distress HEENT: normal Neck: no JVD, carotid bruits, or masses Cardiac: ***RRR;  no murmurs, rubs, or gallops,no edema  Respiratory:  Clear to auscultation bilaterally, normal work of breathing GI: soft, nontender, nondistended, + BS MS: no deformity or atrophy Skin: warm and dry, no rash Neuro:  Strength and sensation are intact Psych: euthymic mood, full affect   EKG:  EKG {ACTION; IS/IS [id number]} ordered today. The ekg ordered today demonstrates ***   Recent Labs: 05/12/2020: B Natriuretic Peptide 49.4 05/14/2020: Magnesium 1.9 11/08/2020: ALT 31; BUN 9; Creatinine, Ser 0.79; Hemoglobin 12.2; Platelets 347.0; Potassium 4.6; Sodium 136; TSH 5.29    Lipid Panel    Component Value Date/Time   CHOL 228 (H)  11/08/2020 1020   TRIG 59.0 11/08/2020 1020   HDL 78.00 11/08/2020 1020   CHOLHDL 3 11/08/2020 1020   VLDL 11.8 11/08/2020 1020   LDLCALC 138 (H) 11/08/2020 1020   LDLDIRECT 132.0 11/08/2020 1020      Wt Readings from Last 3 Encounters:  11/16/20 (!) 347 lb (157.4 kg)  11/08/20 (!) 344 lb 6.4 oz (156.2 kg)  10/14/20 (!) 337 lb 12.8 oz (153.2 kg)      Other studies Reviewed: Echocardiogram 2020/05/19 1. Left ventricular ejection fraction, by estimation, is 60 to 65%. The  left ventricle has normal function. The left ventricle has no regional  wall motion abnormalities. Left ventricular diastolic parameters were  normal.   2. Right ventricular systolic function is low normal. The right  ventricular size is normal.   3. The mitral valve is grossly normal. No evidence of mitral valve  regurgitation.   4. The aortic valve is normal in structure. Aortic valve regurgitation is  not visualized.    ASSESSMENT AND PLAN:  1.  ***   Current medicines are reviewed at length with the patient today.  I have spent *** dedicated to the care of this patient on the date of this encounter to include pre-visit review of records, assessment, management and diagnostic testing,with shared decision making.  Labs/ tests ordered today include: *** Phill Myron. West Pugh, ANP, AACC   11/17/2020 5:05 PM    [facility] [place] [address] Office [phone] Fax [phone]  Notice: This dictation was prepared with Dragon dictation along with smaller phrase technology. Any transcriptional errors that result from this process are unintentional and may not be corrected upon review.

## 2020-11-17 NOTE — Telephone Encounter (Signed)
Pt came to office yesterday to have education regarding ace wraping legs. Pt calls today saying "its not working, my legs are swollen worse than before". Asked pt if she was elevating her legs. She replied with "sometimes, but I have to work". Asked pt how she was wrapping her legs. She was unable to explain this to me. Proceeded to verbally explain to pt how to wrap her leg with ace wrap. Pt was very confused about how to start the wrap at the foot. After many attempts to explain how to wrap leg, pt was still unable to do so. Pt is going to come to the office again tomorrow for more education.

## 2020-11-18 ENCOUNTER — Telehealth: Payer: Self-pay | Admitting: Cardiology

## 2020-11-18 ENCOUNTER — Telehealth: Payer: Self-pay

## 2020-11-18 NOTE — Telephone Encounter (Signed)
  Patient needs a new note for returning to work because she can't return to work until she is seen by cardiology. Her appt on 11/19/20 was canceled due to provider change and she is now scheduled for 11/26/20. Her note was stating she could return on 11/19/20 but she needs a note to be out of work this week to return on 11/26/20.

## 2020-11-18 NOTE — Telephone Encounter (Signed)
Called patient to advise K. Purcell Nails will not be in office and appointment will need to be rescheduled.

## 2020-11-19 ENCOUNTER — Other Ambulatory Visit: Payer: Self-pay

## 2020-11-19 ENCOUNTER — Ambulatory Visit: Payer: 59 | Admitting: Adult Health

## 2020-11-19 ENCOUNTER — Telehealth: Payer: Self-pay | Admitting: Family Medicine

## 2020-11-19 NOTE — Telephone Encounter (Signed)
FYI:Pt called in complaining of her legs being so swollen she could barely walk. She wanted to come up to the office and get worked in. I transferred her over to Nurse Triage.

## 2020-11-19 NOTE — Telephone Encounter (Signed)
Minus Breeding, MD  Meryl Crutch, RN 18 hours ago (10:10 PM)   OK.  She needs to be scheduled next available APP and her work excuse can be up to that date.  Thanks.     Spoke with patient. Advised work letter will be sent to her via [place].

## 2020-11-22 ENCOUNTER — Ambulatory Visit (INDEPENDENT_AMBULATORY_CARE_PROVIDER_SITE_OTHER): Payer: 59 | Admitting: Family Medicine

## 2020-11-22 ENCOUNTER — Encounter: Payer: Self-pay | Admitting: Family Medicine

## 2020-11-22 ENCOUNTER — Other Ambulatory Visit: Payer: Self-pay

## 2020-11-22 VITALS — BP 160/92 | HR 75 | Temp 97.5°F | Resp 16 | Wt 346.0 lb

## 2020-11-22 DIAGNOSIS — I10 Essential (primary) hypertension: Secondary | ICD-10-CM

## 2020-11-22 DIAGNOSIS — Z6841 Body Mass Index (BMI) 40.0 and over, adult: Secondary | ICD-10-CM

## 2020-11-22 DIAGNOSIS — I872 Venous insufficiency (chronic) (peripheral): Secondary | ICD-10-CM | POA: Insufficient documentation

## 2020-11-22 DIAGNOSIS — E559 Vitamin D deficiency, unspecified: Secondary | ICD-10-CM | POA: Diagnosis not present

## 2020-11-22 DIAGNOSIS — I890 Lymphedema, not elsewhere classified: Secondary | ICD-10-CM | POA: Insufficient documentation

## 2020-11-22 DIAGNOSIS — R2242 Localized swelling, mass and lump, left lower limb: Secondary | ICD-10-CM

## 2020-11-22 DIAGNOSIS — R600 Localized edema: Secondary | ICD-10-CM

## 2020-11-22 NOTE — Telephone Encounter (Signed)
Patient called back to say that she didn't ge4t the note sent to the mychart. She is asking If that can be done as soon as possible. Patient also added that he be date to go back to work is 10/4. She sees the PA on Friday. Please advise

## 2020-11-22 NOTE — Progress Notes (Signed)
Established Patient Office Visit  Subjective:  Patient ID: Hannah Orr, female    DOB: 08-13-1975  Age: 45 y.o. MRN: [id number]  CC:  Chief Complaint  Patient presents with   Acute Visit    Swelling and numbness  in legs, pt states she she has gone to see  vascular notes are in chart, pt would like to discuss options regarding disability     HPI Hannah Orr presents for follow-up for consideration of long-term disability.  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  Status post evaluation by vascular and vein specialist.  She has lymphedema and venous insufficiency.  She is in communication with bariatric surgery, she tells me.  She is finding it difficult to exercise.  Cardiology is following her blood pressure.  She has a long-term disability benefit through her job that she would like to pursue.  Past Medical History:  Diagnosis Date   Depression    Hypertension     Past Surgical History:  Procedure Laterality Date   MANDIBLE SURGERY Right 12/11/1993    Family History  Problem Relation Age of Onset   Hypertension Mother    Breast cancer Mother    Hypertension Father    Prostate cancer Father    Cancer Neg Hx    Heart disease Neg Hx    Hyperlipidemia Neg Hx    Kidney disease Neg Hx    Diabetes Neg Hx    Early death Neg Hx    Hearing loss Neg Hx    Stroke Neg Hx     Social History   Socioeconomic History   Marital status: Single    Spouse name: Not on file   Number of children: Not on file   Years of education: Not on file   Highest education level: Not on file  Occupational History   Not on file  Tobacco Use   Smoking status: Never   Smokeless tobacco: Never  Vaping Use   Vaping Use: Never used  Substance and Sexual Activity   Alcohol use: Not Currently    Comment: Stopped drinking December 2021.    Drug use: No   Sexual activity: Yes    Birth control/protection: Condom  Other Topics Concern    Not on file  Social History Narrative   Lives at home with mom.   Social Determinants of Health   Financial Resource Strain: Not on file  Food Insecurity: Not on file  Transportation Needs: Not on file  Physical Activity: Not on file  Stress: Not on file  Social Connections: Not on file  Intimate Partner Violence: Not on file    Outpatient Medications Prior to Visit  Medication Sig Dispense Refill   amLODipine (NORVASC) 5 MG tablet Take 1 tablet (5 mg total) by mouth daily. 90 tablet 0   carvedilol (COREG) 25 MG tablet Take 1 tablet (25 mg total) by mouth 2 (two) times daily with a meal. 180 tablet 3   Cholecalciferol (VITAMIN D3) 125 MCG (5000 UT) CAPS Take 1 capsule (5,000 Units total) by mouth daily. 90 capsule 1   EUTHYROX 100 MCG tablet Take 100 mcg by mouth daily.     FLUoxetine (PROZAC) 40 MG capsule Take 40 mg by mouth daily.     gabapentin (NEURONTIN) 300 MG capsule Take 300 mg by mouth at bedtime.     spironolactone (ALDACTONE) 50 MG tablet Take 1 tablet (50 mg total)  by mouth daily. 90 tablet 1   torsemide (DEMADEX) 20 MG tablet Take 2 tablets (40 mg total) by mouth daily. 180 tablet 3   vitamin B-12 (CYANOCOBALAMIN) 1000 MCG tablet Take 1 tablet (1,000 mcg total) by mouth daily. 90 tablet 1   folic acid (FOLVITE) 1 MG tablet Take 1 tablet (1 mg total) by mouth daily. (Patient not taking: Reported on 11/22/2020) 90 tablet 1   No facility-administered medications prior to visit.    Allergies  Allergen Reactions   Benazepril Hcl Hypertension    cough   Benazepril Hcl     cough    ROS Review of Systems  Constitutional: Negative.   Respiratory: Negative.    Cardiovascular: Negative.   Gastrointestinal: Negative.   Musculoskeletal:  Positive for gait problem.     Objective:    Physical Exam Vitals and nursing note reviewed.  Constitutional:      Appearance: Normal appearance. She is obese.  HENT:     Right Ear: External ear normal.     Left Ear: External  ear normal.  Eyes:     General: No scleral icterus.       Right eye: No discharge.        Left eye: No discharge.     Conjunctiva/sclera: Conjunctivae normal.  Pulmonary:     Effort: Pulmonary effort is normal.  Musculoskeletal:     Right lower leg: Edema present.     Left lower leg: Edema present.  Neurological:     Mental Status: She is alert and oriented to person, place, and time.  Psychiatric:        Mood and Affect: Mood normal.        Behavior: Behavior normal.    BP (!) 160/92 (BP Location: Left Arm, Patient Position: Sitting, Cuff Size: Large)   Pulse 75   Temp (!) 97.5 F (36.4 C) (Temporal)   Resp 16   Wt (!) 346 lb (156.9 kg)   SpO2 96%   BMI 57.58 kg/m  Wt Readings from Last 3 Encounters:  11/22/20 (!) 346 lb (156.9 kg)  11/16/20 (!) 347 lb (157.4 kg)  11/08/20 (!) 344 lb 6.4 oz (156.2 kg)     Health Maintenance Due  Topic Date Due   PAP SMEAR-Modifier  08/23/2013   COLONOSCOPY (Pts 45-69yr Insurance coverage will need to be confirmed)  Never done    There are no preventive care reminders to display for this patient.  Lab Results  Component Value Date   TSH 5.29 11/08/2020   Lab Results  Component Value Date   WBC 2.6 (L) 11/08/2020   HGB 12.2 11/08/2020   HCT 37.9 11/08/2020   MCV 108.8 (H) 11/08/2020   PLT 347.0 11/08/2020   Lab Results  Component Value Date   NA 136 11/08/2020   K 4.6 11/08/2020   CO2 26 11/08/2020   GLUCOSE 96 11/08/2020   BUN 9 11/08/2020   CREATININE 0.79 11/08/2020   BILITOT 0.9 11/08/2020   ALKPHOS 65 11/08/2020   AST 53 (H) 11/08/2020   ALT 31 11/08/2020   PROT 8.1 11/08/2020   ALBUMIN 4.1 11/08/2020   CALCIUM 9.6 11/08/2020   ANIONGAP 14 07/28/2020   EGFR 110 09/21/2020   GFR 90.44 11/08/2020   Lab Results  Component Value Date   CHOL 228 (H) 11/08/2020   Lab Results  Component Value Date   HDL 78.00 11/08/2020   Lab Results  Component Value Date   LDLCALC 138 (H) 11/08/2020  Lab Results   Component Value Date   TRIG 59.0 11/08/2020   Lab Results  Component Value Date   CHOLHDL 3 11/08/2020   Lab Results  Component Value Date   HGBA1C 5.3 08/08/2013      Assessment & Plan:   Problem List Items Addressed This Visit       Cardiovascular and Mediastinum   Essential hypertension   Venous insufficiency     Other   Morbid obesity with BMI of 50.0-59.9, adult ([place])   Relevant Orders   Amb ref to Medical Nutrition Therapy-MNT   Bilateral lower extremity edema - Primary   Vitamin D deficiency   Lymphedema    No orders of the defined types were placed in this encounter.   Follow-up: Return Follow up for scheduled appointment in December..  Again weight loss was advised. Has been communicating with bariatrics.  Agrees to see a nutritionist.  Modifying caloric intake will do more to help her with weight loss and anything else right now.  She has a limited ability for aerobic activity at this point.  Advised her to wrap her lower extremities at all times and to sleep with them elevated.  She will follow-up with cardiology for hypertension.  Spent over 30 minutes in chart review and advising patient about the next steps.  Libby Maw, MD

## 2020-11-22 NOTE — Telephone Encounter (Signed)
Returned call to patient to advise her that I resent the work excuse through her [place]. She states she was able to locate the one sent on Friday. She is aware to keep her appointment with Bunnie Domino, NP on Friday 9/30 and she thanked me for the call.

## 2020-11-23 ENCOUNTER — Telehealth: Payer: Self-pay | Admitting: Family Medicine

## 2020-11-23 NOTE — Telephone Encounter (Signed)
Pt has paperwork from [facility] being faxed over that she would like completed and faxed to # given. Please advise pt when complete.

## 2020-11-25 NOTE — Progress Notes (Deleted)
Cardiology Office Note   Date:  11/25/2020   ID:  ATHA MURADYAN, DOB 01-21-1976, MRN [id number]  PCP:  Libby Maw, MD  Cardiologist:  Dr. Percival Spanish  No chief complaint on file.    History of Present Illness: Hannah Orr is a 45 y.o. female who presents for ongoing assessment and management of difficult to control hypertension and ongoing edema.  Dr. Percival Spanish did review her echocardiogram and it was found to be unremarkable.  Amlodipine was discontinued due to lower extremity edema and she was started on carvedilol.  The patient was to be worked up for OSA but this was canceled by the patient.  When last seen by Dr. Percival Spanish on 10/14/2020 the patient had had spironolactone added to her medication regimen which you had tolerated.  On the visit Dr. Percival Spanish stopped Lasix and begin torsemide 40 mg daily.  He reinforced salt and fluid restriction.  He did not feel that her lymphedema would suggest a need for physical therapy referral.  Because her blood pressure was still not at target, her carvedilol was increased to 25 mg twice daily.  She is on the waiting list for healthy weight and wellness through [facility].  He did provide her with a work excuse due to her decreased mobility.    Past Medical History:  Diagnosis Date   Depression    Hypertension     Past Surgical History:  Procedure Laterality Date   MANDIBLE SURGERY Right 12/11/1993     Current Outpatient Medications  Medication Sig Dispense Refill   amLODipine (NORVASC) 5 MG tablet Take 1 tablet (5 mg total) by mouth daily. 90 tablet 0   carvedilol (COREG) 25 MG tablet Take 1 tablet (25 mg total) by mouth 2 (two) times daily with a meal. 180 tablet 3   Cholecalciferol (VITAMIN D3) 125 MCG (5000 UT) CAPS Take 1 capsule (5,000 Units total) by mouth daily. 90 capsule 1   EUTHYROX 100 MCG tablet Take 100 mcg by mouth daily.     FLUoxetine (PROZAC) 40 MG capsule Take 40 mg by mouth daily.     folic acid  (FOLVITE) 1 MG tablet Take 1 tablet (1 mg total) by mouth daily. (Patient not taking: Reported on 11/22/2020) 90 tablet 1   gabapentin (NEURONTIN) 300 MG capsule Take 300 mg by mouth at bedtime.     spironolactone (ALDACTONE) 50 MG tablet Take 1 tablet (50 mg total) by mouth daily. 90 tablet 1   torsemide (DEMADEX) 20 MG tablet Take 2 tablets (40 mg total) by mouth daily. 180 tablet 3   vitamin B-12 (CYANOCOBALAMIN) 1000 MCG tablet Take 1 tablet (1,000 mcg total) by mouth daily. 90 tablet 1   No current facility-administered medications for this visit.    Allergies:   Benazepril hcl and Benazepril hcl    Social History:  The patient  reports that she has never smoked. She has never used smokeless tobacco. She reports that she does not currently use alcohol. She reports that she does not use drugs.   Family History:  The patient's family history includes Breast cancer in her mother; Hypertension in her father and mother; Prostate cancer in her father.    ROS: All other systems are reviewed and negative. Unless otherwise mentioned in H&P    PHYSICAL EXAM: VS:  There were no vitals taken for this visit. , BMI There is no height or weight on file to calculate BMI. GEN: Well nourished, well developed, in no acute distress HEENT:  normal Neck: no JVD, carotid bruits, or masses Cardiac: ***RRR; no murmurs, rubs, or gallops,no edema  Respiratory:  Clear to auscultation bilaterally, normal work of breathing GI: soft, nontender, nondistended, + BS MS: no deformity or atrophy Skin: warm and dry, no rash Neuro:  Strength and sensation are intact Psych: euthymic mood, full affect   EKG:  EKG {ACTION; IS/IS [id number]} ordered today. The ekg ordered today demonstrates ***   Recent Labs: 05/12/2020: B Natriuretic Peptide 49.4 05/14/2020: Magnesium 1.9 11/08/2020: ALT 31; BUN 9; Creatinine, Ser 0.79; Hemoglobin 12.2; Platelets 347.0; Potassium 4.6; Sodium 136; TSH 5.29    Lipid Panel     Component Value Date/Time   CHOL 228 (H) 11/08/2020 1020   TRIG 59.0 11/08/2020 1020   HDL 78.00 11/08/2020 1020   CHOLHDL 3 11/08/2020 1020   VLDL 11.8 11/08/2020 1020   LDLCALC 138 (H) 11/08/2020 1020   LDLDIRECT 132.0 11/08/2020 1020      Wt Readings from Last 3 Encounters:  11/22/20 (!) 346 lb (156.9 kg)  11/16/20 (!) 347 lb (157.4 kg)  11/08/20 (!) 344 lb 6.4 oz (156.2 kg)      Other studies Reviewed: Echocardiogram May 19, 2020 1. Left ventricular ejection fraction, by estimation, is 60 to 65%. The  left ventricle has normal function. The left ventricle has no regional  wall motion abnormalities. Left ventricular diastolic parameters were  normal.   2. Right ventricular systolic function is low normal. The right  ventricular size is normal.   3. The mitral valve is grossly normal. No evidence of mitral valve  regurgitation.   4. The aortic valve is normal in structure. Aortic valve regurgitation is  not visualized.   ASSESSMENT AND PLAN:  1.  ***   Current medicines are reviewed at length with the patient today.  I have spent *** dedicated to the care of this patient on the date of this encounter to include pre-visit review of records, assessment, management and diagnostic testing,with shared decision making.  Labs/ tests ordered today include: *** Phill Myron. West Pugh, ANP, AACC   11/25/2020 1:56 PM    [facility] [place] [address] Office [phone] Fax [phone]  Notice: This dictation was prepared with Dragon dictation along with smaller phrase technology. Any transcriptional errors that result from this process are unintentional and may not be corrected upon review.

## 2020-11-26 ENCOUNTER — Telehealth: Payer: Self-pay | Admitting: Adult Health

## 2020-11-26 ENCOUNTER — Other Ambulatory Visit: Payer: Self-pay

## 2020-11-26 ENCOUNTER — Ambulatory Visit: Payer: 59 | Admitting: Adult Health

## 2020-11-26 ENCOUNTER — Encounter: Payer: Self-pay | Admitting: Adult Health

## 2020-11-26 ENCOUNTER — Telehealth: Payer: Self-pay

## 2020-11-26 ENCOUNTER — Ambulatory Visit (INDEPENDENT_AMBULATORY_CARE_PROVIDER_SITE_OTHER): Payer: 59 | Admitting: Adult Health

## 2020-11-26 VITALS — BP 140/74 | HR 80 | Ht 65.0 in | Wt 345.2 lb

## 2020-11-26 DIAGNOSIS — I872 Venous insufficiency (chronic) (peripheral): Secondary | ICD-10-CM

## 2020-11-26 DIAGNOSIS — I5032 Chronic diastolic (congestive) heart failure: Secondary | ICD-10-CM

## 2020-11-26 DIAGNOSIS — Z6841 Body Mass Index (BMI) 40.0 and over, adult: Secondary | ICD-10-CM

## 2020-11-26 DIAGNOSIS — I10 Essential (primary) hypertension: Secondary | ICD-10-CM | POA: Diagnosis not present

## 2020-11-26 DIAGNOSIS — Z79899 Other long term (current) drug therapy: Secondary | ICD-10-CM

## 2020-11-26 DIAGNOSIS — K760 Fatty (change of) liver, not elsewhere classified: Secondary | ICD-10-CM

## 2020-11-26 DIAGNOSIS — I890 Lymphedema, not elsewhere classified: Secondary | ICD-10-CM

## 2020-11-26 NOTE — Progress Notes (Signed)
Cardiology Office Note   Date:  11/26/2020   ID:  Hannah Orr, DOB Jan 05, 1976, MRN [id number]  PCP:  Libby Maw, MD  Cardiologist: Dr. Percival Spanish  No chief complaint on file.    History of Present Illness: Hannah Orr is a 45 y.o. female who presents for ongoing assessment and management of difficult to control hypertension, and chronic edema.  She was hospitalized for the same symptoms in March 2022.  Echocardiogram did not reveal any cardiomyopathy.  She was taken off of amlodipine because of the lower extremity edema started on carvedilol.  She was to be tested for OSA but she canceled the test.  When seen last by Dr. Percival Spanish on 10/14/2020 the patient was doing okay, her lower extremity edema was unchanged.  She denied any new symptoms.  Did complain of trouble getting around because of her limited mobility.  Dr. Percival Spanish stopped the Lasix and started torsemide 40 mg daily, with follow-up BMET.  She was given education on salt and fluid restrictions.  Blood pressure was not at goal and therefore carvedilol was increased to 25 mg twice daily, she was continued on spironolactone.  She was also given a work excuse because of her limited mobility until followed up with me today.  At her visit today she denies any new symptoms.  She feels that the torsemide is doing a better job than Lasix.  She continues to have issues with bilateral lymphedema and uses a cane for ambulation.  She does have pain with palpation of the lower extremities.  She denies palpitations, she does have some mild dyspnea on exertion due to deconditioning.  She is avoiding salt.  She is not yet returned to work full-time.  Past Medical History:  Diagnosis Date   Depression    Hypertension     Past Surgical History:  Procedure Laterality Date   MANDIBLE SURGERY Right 12/11/1993     Current Outpatient Medications  Medication Sig Dispense Refill   amLODipine (NORVASC) 5 MG tablet Take 1 tablet (5  mg total) by mouth daily. 90 tablet 0   carvedilol (COREG) 25 MG tablet Take 1 tablet (25 mg total) by mouth 2 (two) times daily with a meal. 180 tablet 3   Cholecalciferol (VITAMIN D3) 125 MCG (5000 UT) CAPS Take 1 capsule (5,000 Units total) by mouth daily. 90 capsule 1   EUTHYROX 100 MCG tablet Take 100 mcg by mouth daily.     FLUoxetine (PROZAC) 40 MG capsule Take 40 mg by mouth daily.     gabapentin (NEURONTIN) 300 MG capsule Take 300 mg by mouth at bedtime.     spironolactone (ALDACTONE) 50 MG tablet Take 1 tablet (50 mg total) by mouth daily. 90 tablet 1   torsemide (DEMADEX) 20 MG tablet Take 2 tablets (40 mg total) by mouth daily. 180 tablet 3   vitamin B-12 (CYANOCOBALAMIN) 1000 MCG tablet Take 1 tablet (1,000 mcg total) by mouth daily. 90 tablet 1   No current facility-administered medications for this visit.    Allergies:   Benazepril hcl and Benazepril hcl    Social History:  The patient  reports that she has never smoked. She has never used smokeless tobacco. She reports that she does not currently use alcohol. She reports that she does not use drugs.   Family History:  The patient's family history includes Breast cancer in her mother; Hypertension in her father and mother; Prostate cancer in her father.    ROS: All other systems are  reviewed and negative. Unless otherwise mentioned in H&P    PHYSICAL EXAM: VS:  BP 140/74 (BP Location: Right Arm, Patient Position: Sitting, Cuff Size: Large)   Pulse 80   Ht 5\' 5"  (1.651 m)   Wt (!) 345 lb 3.2 oz (156.6 kg)   SpO2 96%   BMI 57.44 kg/m  , BMI Body mass index is 57.44 kg/m. GEN: Well nourished, well developed, in no acute distress, morbidly obese HEENT: normal Neck: no JVD, carotid bruits, or masses Cardiac: RRR; no murmurs, rubs, or gallops, bilateral lower extremity lymphedema, pain with palpation pretibially Respiratory:  Clear to auscultation bilaterally, normal work of breathing GI: soft, nontender, nondistended,  + BS MS: no deformity or atrophy Skin: warm and dry, no rash Neuro:  Strength is diminished, and sensation is intact Psych: euthymic mood, full affect   EKG: Not completed this office visit  Recent Labs: 05/12/2020: B Natriuretic Peptide 49.4 05/14/2020: Magnesium 1.9 11/08/2020: ALT 31; BUN 9; Creatinine, Ser 0.79; Hemoglobin 12.2; Platelets 347.0; Potassium 4.6; Sodium 136; TSH 5.29    Lipid Panel    Component Value Date/Time   CHOL 228 (H) 11/08/2020 1020   TRIG 59.0 11/08/2020 1020   HDL 78.00 11/08/2020 1020   CHOLHDL 3 11/08/2020 1020   VLDL 11.8 11/08/2020 1020   LDLCALC 138 (H) 11/08/2020 1020   LDLDIRECT 132.0 11/08/2020 1020      Wt Readings from Last 3 Encounters:  11/26/20 (!) 345 lb 3.2 oz (156.6 kg)  11/22/20 (!) 346 lb (156.9 kg)  11/16/20 (!) 347 lb (157.4 kg)      Other studies Reviewed: Echocardiogram 04/30/20 1. Left ventricular ejection fraction, by estimation, is 60 to 65%. The  left ventricle has normal function. The left ventricle has no regional  wall motion abnormalities. Left ventricular diastolic parameters were  normal.   2. Right ventricular systolic function is low normal. The right  ventricular size is normal.   3. The mitral valve is grossly normal. No evidence of mitral valve  regurgitation.   4. The aortic valve is normal in structure. Aortic valve regurgitation is  not visualized.    ASSESSMENT AND PLAN:  1.  Chronic diastolic heart failure: She appears euvolemic at this time.  Weight is stable.  She is tolerating torsemide and feels she is getting better results from this.  She will follow-up with Dr. Percival Spanish in 3 months with a follow-up BMET for ongoing evaluation of kidney function on high doses of diuretic.  2.  Bilateral lymphedema: She is unable to wear support hose, she does occasionally wrap her legs.  She is having considerable amount of pain in her legs from lymphedema.  She is being followed by vein and vascular.  No  changes in regimen  3.  Hypertension: Currently well controlled on medication regimen.  She was taken off of amlodipine due to dependent edema and remains on spironolactone carvedilol.  4.  Limited mobility: This is greatly affecting her ability to walk, stand, and work at her job.  She is being followed closely by multiple physicians.  Current medicines are reviewed at length with the patient today.  I have spent 25 minutes dedicated to the care of this patient on the date of this encounter to include pre-visit review of records, assessment, management and diagnostic testing,with shared decision making.  Labs/ tests ordered today include: BMET in 3 months prior to follow-up Phill Myron. West Pugh, ANP, AACC   11/26/2020 11:59 AM    [facility] [address] Office [phone] Fax [phone]  Notice: This dictation was prepared with Dragon dictation along with smaller phrase technology. Any transcriptional errors that result from this process are unintentional and may not be corrected upon review.

## 2020-11-26 NOTE — Telephone Encounter (Signed)
Patient changed appt from 2:45 pm this afternoon to 10:45 this morning due to weather

## 2020-11-26 NOTE — Patient Instructions (Signed)
Medication Instructions:  The current medical regimen is effective;  continue present plan and medications as directed. Please refer to the Current Medication list given to you today.   *If you need a refill on your cardiac medications before your next appointment, please call your pharmacy*  Lab Work:    BMET [place] UP APPOINTMENT-THIS IS NON-FASTING.  Follow-Up: Your next appointment:  3 month(s) In Person with You may see Minus Breeding, MD, Jory Sims, DNP, ANP or one of the following Advanced Practice Providers on your designated Care Team:   Rosaria Ferries, PA-C Caron Presume, PA-C  At [facility], you and your health needs are our priority.  As part of our continuing mission to provide you with exceptional heart care, we have created designated Provider Care Teams.  These Care Teams include your primary Cardiologist (physician) and Advanced Practice Providers (APPs -  Physician Assistants and Nurse Practitioners) who all work together to provide you with the care you need, when you need it.

## 2020-11-29 ENCOUNTER — Ambulatory Visit: Payer: 59 | Admitting: Family Medicine

## 2020-11-29 NOTE — Telephone Encounter (Signed)
Good morning do you know if forms were completed and sent off?

## 2020-11-30 NOTE — Telephone Encounter (Signed)
Pt calling in to check on paperwork process

## 2020-12-01 NOTE — Telephone Encounter (Signed)
Forms completed and faxed, patient aware

## 2020-12-02 NOTE — Telephone Encounter (Signed)
Pt is calling back stating Hannah Orr is needing more info. She is wanting-([name] said) RTW, treatment plan and any PT she may be needing. Pt would like a call back.

## 2020-12-02 NOTE — Telephone Encounter (Signed)
Returned call was not able to speak with Hannah Orr did leave a message informing that I would fax over patients last office notes from 11/08/20.

## 2020-12-02 NOTE — Telephone Encounter (Signed)
[place] called 10/6, said the received the forms but the office notes they requested are missing.

## 2020-12-31 ENCOUNTER — Telehealth: Payer: Self-pay | Admitting: Family Medicine

## 2020-12-31 NOTE — Telephone Encounter (Signed)
Message below FYI.

## 2020-12-31 NOTE — Telephone Encounter (Signed)
Msg for Dr Ethelene Hal:  Dr Lelon Frohlich from the [employer], called regarding Hannah Orr.  This is a Peer Review for work restrictions and limitations from Feb'22-Nov'22.   Dr Niel Hummer states if you have anything to add, please call her directly, [phone]. If nothing to add, no call necessary.

## 2021-01-03 ENCOUNTER — Telehealth: Payer: Self-pay | Admitting: Family Medicine

## 2021-01-03 NOTE — Telephone Encounter (Signed)
Pt would like a call concerning her medical records for disability. She is wanting to know if they were sent. I let her know they had been sent, she is wanting a confirmation that they were sent. Please advise pt at [phone].

## 2021-01-05 NOTE — Telephone Encounter (Signed)
Copy of conformation faxed mailed to patient second time this have been sent to patient.

## 2021-02-07 ENCOUNTER — Other Ambulatory Visit: Payer: Self-pay

## 2021-02-07 ENCOUNTER — Encounter: Payer: Self-pay | Admitting: Family Medicine

## 2021-02-07 ENCOUNTER — Ambulatory Visit (INDEPENDENT_AMBULATORY_CARE_PROVIDER_SITE_OTHER): Payer: 59 | Admitting: Family Medicine

## 2021-02-07 VITALS — BP 158/92 | HR 85 | Temp 97.1°F | Ht 65.0 in | Wt 359.6 lb

## 2021-02-07 DIAGNOSIS — R600 Localized edema: Secondary | ICD-10-CM

## 2021-02-07 DIAGNOSIS — I872 Venous insufficiency (chronic) (peripheral): Secondary | ICD-10-CM | POA: Diagnosis not present

## 2021-02-07 DIAGNOSIS — G4700 Insomnia, unspecified: Secondary | ICD-10-CM

## 2021-02-07 DIAGNOSIS — G629 Polyneuropathy, unspecified: Secondary | ICD-10-CM

## 2021-02-07 DIAGNOSIS — E559 Vitamin D deficiency, unspecified: Secondary | ICD-10-CM

## 2021-02-07 DIAGNOSIS — F324 Major depressive disorder, single episode, in partial remission: Secondary | ICD-10-CM

## 2021-02-07 DIAGNOSIS — E039 Hypothyroidism, unspecified: Secondary | ICD-10-CM

## 2021-02-07 DIAGNOSIS — Z6841 Body Mass Index (BMI) 40.0 and over, adult: Secondary | ICD-10-CM

## 2021-02-07 LAB — TSH: TSH: 5.88 u[IU]/mL — ABNORMAL HIGH (ref 0.35–5.50)

## 2021-02-07 LAB — VITAMIN D 25 HYDROXY (VIT D DEFICIENCY, FRACTURES): VITD: 18.69 ng/mL — ABNORMAL LOW (ref 30.00–100.00)

## 2021-02-07 MED ORDER — FLUOXETINE HCL 40 MG PO CAPS: 40.0000 mg | ORAL_CAPSULE | Freq: Every day | ORAL | 1 refills | Status: DC

## 2021-02-07 MED ORDER — TRAZODONE HCL 50 MG PO TABS: 25.0000 mg | ORAL_TABLET | Freq: Every evening | ORAL | 3 refills | Status: DC | PRN

## 2021-02-07 MED ORDER — GABAPENTIN 400 MG PO CAPS: 400.0000 mg | ORAL_CAPSULE | Freq: Every day | ORAL | 1 refills | Status: DC

## 2021-02-07 NOTE — Progress Notes (Signed)
Established Patient Office Visit  Subjective:  Patient ID: Hannah Orr, female    DOB: 02-06-1976  Age: 45 y.o. MRN: [id number]  CC:  Chief Complaint  Patient presents with   Follow-up    3 month follow up, patient fasting.     HPI Hannah Orr presents for follow-up of hypothyroidism, depression, vitamin D deficiency, neuropathy.  Patient still has burning and stinging on her lower extremities when she lies down at night.  She has been out of the gabapentin.  She tells me that she has been wrapping her legs.  Has been taking 5000 international units of vitamin D.  Prozac seems to be helping her depression but she is not sleeping well at night.  She put all her medicines out on the counter for me to see.  However, she did not put her levothyroxine out.  When I asked about that she said "so do I need to continue taking this medicine "?  She tells me that her insurance did not cover the nutritionist or the bariatric consult.  Past Medical History:  Diagnosis Date   Depression    Hypertension     Past Surgical History:  Procedure Laterality Date   MANDIBLE SURGERY Right 12/11/1993    Family History  Problem Relation Age of Onset   Hypertension Mother    Breast cancer Mother    Hypertension Father    Prostate cancer Father    Cancer Neg Hx    Heart disease Neg Hx    Hyperlipidemia Neg Hx    Kidney disease Neg Hx    Diabetes Neg Hx    Early death Neg Hx    Hearing loss Neg Hx    Stroke Neg Hx     Social History   Socioeconomic History   Marital status: Single    Spouse name: Not on file   Number of children: Not on file   Years of education: Not on file   Highest education level: Not on file  Occupational History   Not on file  Tobacco Use   Smoking status: Never   Smokeless tobacco: Never  Vaping Use   Vaping Use: Never used  Substance and Sexual Activity   Alcohol use: Not Currently    Comment: Stopped drinking December 2021.    Drug use: No    Sexual activity: Yes    Birth control/protection: Condom  Other Topics Concern   Not on file  Social History Narrative   Lives at home with mom.   Social Determinants of Health   Financial Resource Strain: Not on file  Food Insecurity: Not on file  Transportation Needs: Not on file  Physical Activity: Not on file  Stress: Not on file  Social Connections: Not on file  Intimate Partner Violence: Not on file    Outpatient Medications Prior to Visit  Medication Sig Dispense Refill   amLODipine (NORVASC) 5 MG tablet Take 1 tablet (5 mg total) by mouth daily. 90 tablet 0   carvedilol (COREG) 25 MG tablet Take 1 tablet (25 mg total) by mouth 2 (two) times daily with a meal. 180 tablet 3   Cholecalciferol (VITAMIN D3) 125 MCG (5000 UT) CAPS Take 1 capsule (5,000 Units total) by mouth daily. 90 capsule 1   spironolactone (ALDACTONE) 50 MG tablet Take 1 tablet (50 mg total) by mouth daily. 90 tablet 1   torsemide (DEMADEX) 20 MG tablet Take 2 tablets (40 mg total) by mouth daily. 180 tablet 3  vitamin B-12 (CYANOCOBALAMIN) 1000 MCG tablet Take 1 tablet (1,000 mcg total) by mouth daily. 90 tablet 1   FLUoxetine (PROZAC) 40 MG capsule Take 40 mg by mouth daily.     EUTHYROX 100 MCG tablet Take 100 mcg by mouth daily. (Patient not taking: Reported on 02/07/2021)     gabapentin (NEURONTIN) 300 MG capsule Take 300 mg by mouth at bedtime. (Patient not taking: Reported on 02/07/2021)     No facility-administered medications prior to visit.    Allergies  Allergen Reactions   Benazepril Hcl Hypertension    cough   Benazepril Hcl     cough    ROS Review of Systems  Constitutional:  Negative for diaphoresis, fatigue, fever and unexpected weight change.  HENT: Negative.    Eyes:  Negative for photophobia and visual disturbance.  Respiratory: Negative.    Cardiovascular: Negative.   Gastrointestinal: Negative.   Endocrine: Negative for polyphagia and polyuria.  Genitourinary: Negative.    Musculoskeletal:  Positive for gait problem.  Neurological:  Negative for speech difficulty and weakness.     Objective:    Physical Exam Vitals and nursing note reviewed.  Constitutional:      General: She is not in acute distress.    Appearance: Normal appearance. She is obese. She is not ill-appearing, toxic-appearing or diaphoretic.  HENT:     Head: Normocephalic and atraumatic.     Right Ear: External ear normal.     Left Ear: External ear normal.  Eyes:     General: No scleral icterus.       Right eye: No discharge.        Left eye: No discharge.     Conjunctiva/sclera: Conjunctivae normal.  Cardiovascular:     Rate and Rhythm: Normal rate and regular rhythm.  Pulmonary:     Effort: Pulmonary effort is normal.     Breath sounds: Normal breath sounds.  Musculoskeletal:     Cervical back: No rigidity or tenderness.     Right lower leg: Edema (1 plus) present.     Left lower leg: Edema (1 plus) present.  Lymphadenopathy:     Cervical: No cervical adenopathy.  Neurological:     Mental Status: She is alert.   It was painful for patient when I checked for edema in the right leg. She exclaimed pain in the left before I checked it.  Her legs are not wrapped today.  BP (!) 158/92 (BP Location: Right Arm, Patient Position: Sitting, Cuff Size: Large)   Pulse 85   Temp (!) 97.1 F (36.2 C) (Temporal)   Ht _0  (1.651 m)   Wt (!) 359 lb 9.6 oz (163.1 kg)   SpO2 95%   BMI 59.84 kg/m  Wt Readings from Last 3 Encounters:  02/07/21 (!) 359 lb 9.6 oz (163.1 kg)  11/26/20 (!) 345 lb 3.2 oz (156.6 kg)  11/22/20 (!) 346 lb (156.9 kg)     Health Maintenance Due  Topic Date Due   Pneumococcal Vaccine 58-47 Years old (1 - PCV) Never done   PAP SMEAR-Modifier  08/23/2013   COLONOSCOPY (Pts 45-75yr Insurance coverage will need to be confirmed)  Never done    There are no preventive care reminders to display for this patient.  Lab Results  Component Value Date   TSH 5.29  11/08/2020   Lab Results  Component Value Date   WBC 2.6 (L) 11/08/2020   HGB 12.2 11/08/2020   HCT 37.9 11/08/2020   MCV 108.8 (H)  11/08/2020   PLT 347.0 11/08/2020   Lab Results  Component Value Date   NA 136 11/08/2020   K 4.6 11/08/2020   CO2 26 11/08/2020   GLUCOSE 96 11/08/2020   BUN 9 11/08/2020   CREATININE 0.79 11/08/2020   BILITOT 0.9 11/08/2020   ALKPHOS 65 11/08/2020   AST 53 (H) 11/08/2020   ALT 31 11/08/2020   PROT 8.1 11/08/2020   ALBUMIN 4.1 11/08/2020   CALCIUM 9.6 11/08/2020   ANIONGAP 14 07/28/2020   EGFR 110 09/21/2020   GFR 90.44 11/08/2020   Lab Results  Component Value Date   CHOL 228 (H) 11/08/2020   Lab Results  Component Value Date   HDL 78.00 11/08/2020   Lab Results  Component Value Date   LDLCALC 138 (H) 11/08/2020   Lab Results  Component Value Date   TRIG 59.0 11/08/2020   Lab Results  Component Value Date   CHOLHDL 3 11/08/2020   Lab Results  Component Value Date   HGBA1C 5.3 08/08/2013      Assessment & Plan:   Problem List Items Addressed This Visit       Cardiovascular and Mediastinum   Venous insufficiency     Endocrine   Acquired hypothyroidism   Relevant Orders   TSH     Other   Morbid obesity with BMI of 50.0-59.9, adult ([place])   Relevant Orders   Amb Ref to Medical Weight Management   Bilateral lower extremity edema - Primary   Vitamin D deficiency   Relevant Orders   VITAMIN D 25 Hydroxy (Vit-D Deficiency, Fractures)   Depression, major, single episode, in partial remission (HCC)   Relevant Medications   FLUoxetine (PROZAC) 40 MG capsule   traZODone (DESYREL) 50 MG tablet   Other Visit Diagnoses     Insomnia, unspecified type       Relevant Medications   traZODone (DESYREL) 50 MG tablet   Neuropathy       Relevant Medications   gabapentin (NEURONTIN) 400 MG capsule       Meds ordered this encounter  Medications   FLUoxetine (PROZAC) 40 MG capsule    Sig: Take 1 capsule (40 mg total)  by mouth daily.    Dispense:  90 capsule    Refill:  1   gabapentin (NEURONTIN) 400 MG capsule    Sig: Take 1 capsule (400 mg total) by mouth at bedtime.    Dispense:  90 capsule    Refill:  1   traZODone (DESYREL) 50 MG tablet    Sig: Take 0.5-1 tablets (25-50 mg total) by mouth at bedtime as needed for sleep.    Dispense:  30 tablet    Refill:  3    Follow-up: Return in about 3 months (around 05/08/2021).  She has me about what she can do about her lower extremities.  Again weight loss is the key.  Apparently bariatric surgery and nutritionist is not available for her through her insurance.  Advised that she will need intake thyroid hormone replacement for the rest of her life.  Not certain that she is currently taking it.  Rechecking TSH today.  Have increased gabapentin.  Libby Maw, MD

## 2021-02-10 ENCOUNTER — Telehealth: Payer: Self-pay

## 2021-02-10 ENCOUNTER — Telehealth: Payer: Self-pay | Admitting: Family Medicine

## 2021-02-10 DIAGNOSIS — E039 Hypothyroidism, unspecified: Secondary | ICD-10-CM

## 2021-02-10 MED ORDER — VITAMIN D (ERGOCALCIFEROL) 1.25 MG (50000 UNIT) PO CAPS: 50000.0000 [IU] | ORAL_CAPSULE | ORAL | 1 refills | Status: DC

## 2021-02-10 NOTE — Addendum Note (Signed)
Addended by: Jon Billings on: 02/10/2021 08:09 AM   Modules accepted: Orders

## 2021-02-11 ENCOUNTER — Telehealth: Payer: Self-pay

## 2021-02-11 NOTE — Telephone Encounter (Signed)
Pt calling stating that medication Gabapentin prescribed for neuropathy, is not helping. Also was she has questioned about thyroid medication says this was discussed at [facility] visit on 02/07/2021, pt states she went to pharmacy for Rx for thyroid and it was not available for pick up.    Please advise.

## 2021-02-11 NOTE — Telephone Encounter (Signed)
Pt called back and said she was told yesterday that someone said they were going to call her back this morning but she hasnt heard anything, please advise. Call back [phone]

## 2021-02-14 ENCOUNTER — Telehealth: Payer: Self-pay

## 2021-02-14 MED ORDER — EUTHYROX 100 MCG PO TABS: 100.0000 ug | ORAL_TABLET | Freq: Every day | ORAL | 0 refills | Status: DC

## 2021-02-15 NOTE — Telephone Encounter (Signed)
Called and spoke to pt, explained thyroid medication was renewed and to f/u with her pcp Dr. Ethelene Hal in 4-6 wks. Pt voiced understanding.

## 2021-03-03 ENCOUNTER — Telehealth: Payer: Self-pay | Admitting: Family Medicine

## 2021-03-04 NOTE — Telephone Encounter (Signed)
All issues resolved, encounter open in error

## 2021-03-09 NOTE — Telephone Encounter (Signed)
Returned patents call regarding referral and also to see if patient have started back on her thyroid medication which was picked up on 02/17/21 per pharmacist. No answer LMTCB

## 2021-03-11 NOTE — Telephone Encounter (Signed)
Spoke with patient went over labs patient states that she have been taking Vit. D and thyroid medication. Patient have concerns about upcoming appointment on 03/17/21 patient would like to know if Dr. Ethelene Hal will be able to fill out forms for disability until she has an appointment to see specialist for her lymphoedema. Asked patient to bring forms if she has them just in case.

## 2021-03-11 NOTE — Telephone Encounter (Signed)
Returned patients call, no answer also needing to go over labs with patient. LMTCB

## 2021-03-17 ENCOUNTER — Ambulatory Visit: Payer: Self-pay | Admitting: Family Medicine

## 2021-03-18 ENCOUNTER — Ambulatory Visit: Payer: Self-pay | Admitting: Family Medicine

## 2021-03-24 ENCOUNTER — Ambulatory Visit: Payer: Self-pay | Admitting: Cardiology

## 2021-03-24 NOTE — Progress Notes (Signed)
Cardiology Office Note   Date:  03/25/2021   ID:  Hannah Orr, DOB [birth year], MRN [id number]  PCP:  Libby Maw, MD  Cardiologist:   Minus Breeding, MD Referring:  Libby Maw, MD  No chief complaint on file.      History of Present Illness: Hannah Orr is a 46 y.o. female who is referred by Libby Maw, MD for evaluation of edema and difficult to control HTN.   She was in the hospital in March for this.  She had IV diuresis.  I did review these records for this appointment.  An echocardiogram was basically unremarkable.  She was taken off of amlodipine because of the swelling and started on carvedilol.  She was to have a sleep study home test but she canceled this.  She is here for management of both issues.  She returns for follow up she says her blood pressures are better controlled at home.  She states she did not take any of her medicines this morning.  She does report that she is taking her Demadex 40 once a day and is we think she has been about 40 twice a day.  Her weight is down from the previous weight and back to what it has been multiple other visits.  I am not clear how accurate the previous weight was.  Overall she does not think her swelling is any better or worse.  She does not think her breathing is better or worse.  She is not having any acute shortness of breath, PND or orthopnea.  She is not having any new palpitations, presyncope or syncope.  She gets around with a cane.   Past Medical History:  Diagnosis Date   Depression    Hypertension     Past Surgical History:  Procedure Laterality Date   MANDIBLE SURGERY Right 12/11/1993     Current Outpatient Medications  Medication Sig Dispense Refill   amLODipine (NORVASC) 5 MG tablet Take 1 tablet (5 mg total) by mouth daily. 90 tablet 0   carvedilol (COREG) 25 MG tablet Take 1 tablet (25 mg total) by mouth 2 (two) times daily with a meal. 180 tablet 3   EUTHYROX 100  MCG tablet Take 1 tablet (100 mcg total) by mouth daily. 90 tablet 0   FLUoxetine (PROZAC) 40 MG capsule Take 1 capsule (40 mg total) by mouth daily. 90 capsule 1   gabapentin (NEURONTIN) 400 MG capsule Take 1 capsule (400 mg total) by mouth at bedtime. 90 capsule 1   torsemide (DEMADEX) 20 MG tablet Take 2 tablets (40 mg total) by mouth daily. 180 tablet 3   traZODone (DESYREL) 50 MG tablet Take 0.5-1 tablets (25-50 mg total) by mouth at bedtime as needed for sleep. 30 tablet 3   vitamin B-12 (CYANOCOBALAMIN) 1000 MCG tablet Take 1 tablet (1,000 mcg total) by mouth daily. 90 tablet 1   Vitamin D, Ergocalciferol, (DRISDOL) 1.25 MG (50000 UNIT) CAPS capsule Take 1 capsule (50,000 Units total) by mouth every 7 (seven) days. 15 capsule 1   No current facility-administered medications for this visit.    Allergies:   Benazepril hcl and Benazepril hcl    ROS:  Please see the history of present illness.   Otherwise, review of systems are positive for none.   All other systems are reviewed and negative.    PHYSICAL EXAM: VS:  BP (!) 166/100    Pulse 91    Ht 5\' 5"  (1.651 m)  Wt (!) 347 lb (157.4 kg)    SpO2 96%    BMI 57.74 kg/m  , BMI Body mass index is 57.74 kg/m. GENERAL:  Well appearing NECK:  No jugular venous distention, waveform within normal limits, carotid upstroke brisk and symmetric, no bruits, no thyromegaly LUNGS:  Clear to auscultation bilaterally CHEST:  Unremarkable HEART:  PMI not displaced or sustained,S1 and S2 within normal limits, no S3, no S4, no clicks, no rubs, no murmurs ABD:  Flat, positive bowel sounds normal in frequency in pitch, no bruits, no rebound, no guarding, no midline pulsatile mass, no hepatomegaly, no splenomegaly EXT:  2 plus pulses throughout, no edema, no cyanosis no clubbing   EKG:  EKG is not ordered today.    Recent Labs: 05/12/2020: B Natriuretic Peptide 49.4 05/14/2020: Magnesium 1.9 11/08/2020: ALT 31; BUN 9; Creatinine, Ser 0.79; Hemoglobin  12.2; Platelets 347.0; Potassium 4.6; Sodium 136 02/07/2021: TSH 5.88    Lipid Panel    Component Value Date/Time   CHOL 228 (H) 11/08/2020 1020   TRIG 59.0 11/08/2020 1020   HDL 78.00 11/08/2020 1020   CHOLHDL 3 11/08/2020 1020   VLDL 11.8 11/08/2020 1020   LDLCALC 138 (H) 11/08/2020 1020   LDLDIRECT 132.0 11/08/2020 1020      Wt Readings from Last 3 Encounters:  03/25/21 (!) 347 lb (157.4 kg)  02/07/21 (!) 359 lb 9.6 oz (163.1 kg)  11/26/20 (!) 345 lb 3.2 oz (156.6 kg)      Other studies Reviewed: Additional studies/ records that were reviewed today include: None  Review of the above records demonstrates:  Please see elsewhere in the note.     ASSESSMENT AND PLAN:  EDEMA:    We are going to have to clarify her dose of Demadex.  I will check a basic metabolic profile today.  We are going to try to have her come back to [facility] with her blood pressure cuff to get this correlated with our readings and her bag of medicines to go over these.  If she is only taking 40 mg Demadex daily and I think it would be reasonable, pending her blood work, to increase potentially to 40 mg twice daily.  We talked again about salt and fluid restriction.   HTN:    The BP is elevated today but she did not take her medicines.  When she comes back for appointments she needs to have taken her morning medicines.  We will check her blood pressure cuff.  Next step might be hydralazine.   MORBID OBESITY:   We will try again for a referral to healthy weight and wellness.    Current medicines are reviewed at length with the patient today.  The patient does not have concerns regarding medicines.  The following changes have been made:  None  Labs/ tests ordered today include: None  Orders Placed This Encounter  Procedures   Basic metabolic panel   AMB Referral to [facility] Pharm[facility]     Disposition:   FU with Jory Sims DNP in 3 months.    Signed, Minus Breeding, MD   03/25/2021 2:13 PM    [facility]

## 2021-03-25 ENCOUNTER — Encounter: Payer: Self-pay | Admitting: Cardiology

## 2021-03-25 ENCOUNTER — Encounter: Payer: Self-pay | Admitting: *Deleted

## 2021-03-25 ENCOUNTER — Other Ambulatory Visit: Payer: Self-pay

## 2021-03-25 ENCOUNTER — Ambulatory Visit (INDEPENDENT_AMBULATORY_CARE_PROVIDER_SITE_OTHER): Payer: 59 | Admitting: Cardiology

## 2021-03-25 VITALS — BP 166/100 | HR 91 | Ht 65.0 in | Wt 347.0 lb

## 2021-03-25 DIAGNOSIS — I10 Essential (primary) hypertension: Secondary | ICD-10-CM

## 2021-03-25 DIAGNOSIS — M7989 Other specified soft tissue disorders: Secondary | ICD-10-CM | POA: Diagnosis not present

## 2021-03-25 DIAGNOSIS — Z5181 Encounter for therapeutic drug level monitoring: Secondary | ICD-10-CM

## 2021-03-25 DIAGNOSIS — I890 Lymphedema, not elsewhere classified: Secondary | ICD-10-CM | POA: Diagnosis not present

## 2021-03-25 NOTE — Patient Instructions (Addendum)
Medication Instructions:  Your physician recommends that you continue on your current medications as directed. Please refer to the Current Medication list given to you today.   *If you need a refill on your cardiac medications before your next appointment, please call your pharmacy*  Lab Work: BMET TODAY  If you have labs (blood work) drawn today and your tests are completely normal, you will receive your results only by: [place] (if you have MyChart) OR A paper copy in the mail If you have any lab test that is abnormal or we need to change your treatment, we will call you to review the results.  Testing/Procedures: NONE   Follow-Up: At [facility], you and your health needs are our priority.  As part of our continuing mission to provide you with exceptional heart care, we have created designated Provider Care Teams.  These Care Teams include your primary Cardiologist (physician) and Advanced Practice Providers (APPs -  Physician Assistants and Nurse Practitioners) who all work together to provide you with the care you need, when you need it.  We recommend signing up for the patient portal called "MyChart".  Sign up information is provided on this After Visit Summary.  MyChart is used to connect with patients for Virtual Visits (Telemedicine).  Patients are able to view lab/test results, encounter notes, upcoming appointments, etc.  Non-urgent messages can be sent to your provider as well.   To learn more about what you can do with MyChart, go to [email or web address].    Your next appointment:   3 month(s)  The format for your next appointment:   In Person  Provider:    Arnold Long NP   You have been referred to [facility] 1 WEEK   Other Instructions  [place], LOG, AND MEDICINES TO YOUR FOLLOW UP IN 1 [place]   TO GET  APPOINTMENT SCHEDULED, REFERRAL HAS BEEN MADE  Address: [address], [place], [place] [address] Phone: [phone]

## 2021-03-26 LAB — BASIC METABOLIC PANEL
BUN/Creatinine Ratio: 13 (ref 9–23)
BUN: 9 mg/dL (ref 6–24)
CO2: 20 mmol/L (ref 20–29)
Calcium: 9.7 mg/dL (ref 8.7–10.2)
Chloride: 97 mmol/L (ref 96–106)
Creatinine, Ser: 0.69 mg/dL (ref 0.57–1.00)
Glucose: 79 mg/dL (ref 70–99)
Potassium: 4.9 mmol/L (ref 3.5–5.2)
Sodium: 134 mmol/L (ref 134–144)
eGFR: 109 mL/min/{1.73_m2} (ref >59)

## 2021-03-28 ENCOUNTER — Ambulatory Visit (INDEPENDENT_AMBULATORY_CARE_PROVIDER_SITE_OTHER): Payer: 59 | Admitting: Family Medicine

## 2021-03-28 ENCOUNTER — Other Ambulatory Visit: Payer: Self-pay

## 2021-03-28 ENCOUNTER — Encounter: Payer: Self-pay | Admitting: Family Medicine

## 2021-03-28 VITALS — BP 162/90 | HR 87 | Temp 97.3°F | Ht 65.0 in | Wt 338.6 lb

## 2021-03-28 DIAGNOSIS — E039 Hypothyroidism, unspecified: Secondary | ICD-10-CM

## 2021-03-28 DIAGNOSIS — E559 Vitamin D deficiency, unspecified: Secondary | ICD-10-CM

## 2021-03-28 DIAGNOSIS — I872 Venous insufficiency (chronic) (peripheral): Secondary | ICD-10-CM | POA: Diagnosis not present

## 2021-03-28 DIAGNOSIS — E538 Deficiency of other specified B group vitamins: Secondary | ICD-10-CM

## 2021-03-28 MED ORDER — VITAMIN B-12 1000 MCG PO TABS: 1000.0000 ug | ORAL_TABLET | Freq: Every day | ORAL | 1 refills | Status: DC

## 2021-03-28 MED ORDER — LEVOTHYROXINE SODIUM 112 MCG PO TABS: 112.0000 ug | ORAL_TABLET | Freq: Every day | ORAL | 1 refills | Status: DC

## 2021-03-28 NOTE — Progress Notes (Signed)
Established Patient Office Visit  Subjective:  Patient ID: Hannah Orr, female    DOB: Feb 16, 1976  Age: 46 y.o. MRN: [id number]  CC:  Chief Complaint  Patient presents with   Referral    Swelling in legs patient would like to see specialist for continued swelling.     HPI Hannah Orr presents for follow-up of swelling in her lower extremities and hypothyroidism.  TSH is mildly elevated at last check.  Seeing cardiology for hypertension.  Using a cane for ambulation.  She is taking the weekly high-dose 50,000 unit vitamin D pill.  She is taking 1000 mcg of B12.  Vitamin D and B12 levels were low.  Past Medical History:  Diagnosis Date   Depression    Hypertension     Past Surgical History:  Procedure Laterality Date   MANDIBLE SURGERY Right 12/11/1993    Family History  Problem Relation Age of Onset   Hypertension Mother    Breast cancer Mother    Hypertension Father    Prostate cancer Father    Cancer Neg Hx    Heart disease Neg Hx    Hyperlipidemia Neg Hx    Kidney disease Neg Hx    Diabetes Neg Hx    Early death Neg Hx    Hearing loss Neg Hx    Stroke Neg Hx     Social History   Socioeconomic History   Marital status: Single    Spouse name: Not on file   Number of children: Not on file   Years of education: Not on file   Highest education level: Not on file  Occupational History   Not on file  Tobacco Use   Smoking status: Never   Smokeless tobacco: Never  Vaping Use   Vaping Use: Never used  Substance and Sexual Activity   Alcohol use: Not Currently    Comment: Stopped drinking December 2021.    Drug use: No   Sexual activity: Yes    Birth control/protection: Condom  Other Topics Concern   Not on file  Social History Narrative   Lives at home with mom.   Social Determinants of Health   Financial Resource Strain: Not on file  Food Insecurity: Not on file  Transportation Needs: Not on file  Physical Activity: Not on file   Stress: Not on file  Social Connections: Not on file  Intimate Partner Violence: Not on file    Outpatient Medications Prior to Visit  Medication Sig Dispense Refill   amLODipine (NORVASC) 5 MG tablet Take 1 tablet (5 mg total) by mouth daily. 90 tablet 0   carvedilol (COREG) 25 MG tablet Take 1 tablet (25 mg total) by mouth 2 (two) times daily with a meal. 180 tablet 3   FLUoxetine (PROZAC) 40 MG capsule Take 1 capsule (40 mg total) by mouth daily. 90 capsule 1   gabapentin (NEURONTIN) 400 MG capsule Take 1 capsule (400 mg total) by mouth at bedtime. 90 capsule 1   torsemide (DEMADEX) 20 MG tablet Take 2 tablets (40 mg total) by mouth daily. 180 tablet 3   traZODone (DESYREL) 50 MG tablet Take 0.5-1 tablets (25-50 mg total) by mouth at bedtime as needed for sleep. 30 tablet 3   Vitamin D, Ergocalciferol, (DRISDOL) 1.25 MG (50000 UNIT) CAPS capsule Take 1 capsule (50,000 Units total) by mouth every 7 (seven) days. 15 capsule 1   EUTHYROX 100 MCG tablet Take 1 tablet (100 mcg total) by mouth daily. 90 tablet  0   vitamin B-12 (CYANOCOBALAMIN) 1000 MCG tablet Take 1 tablet (1,000 mcg total) by mouth daily. 90 tablet 1   No facility-administered medications prior to visit.    Allergies  Allergen Reactions   Benazepril Hcl Hypertension    cough   Benazepril Hcl     cough    ROS Review of Systems  Constitutional:  Negative for diaphoresis, fatigue, fever and unexpected weight change.  Respiratory: Negative.    Cardiovascular: Negative.   Gastrointestinal: Negative.   Genitourinary: Negative.   Musculoskeletal:  Positive for gait problem.  Neurological:  Negative for speech difficulty and weakness.     Objective:    Physical Exam Vitals and nursing note reviewed.  Constitutional:      Appearance: Normal appearance. She is obese.  HENT:     Head: Normocephalic and atraumatic.     Right Ear: External ear normal.     Left Ear: External ear normal.  Eyes:     General: No  scleral icterus.       Right eye: No discharge.        Left eye: No discharge.     Extraocular Movements: Extraocular movements intact.     Conjunctiva/sclera: Conjunctivae normal.  Cardiovascular:     Rate and Rhythm: Normal rate and regular rhythm.  Pulmonary:     Effort: Pulmonary effort is normal.     Breath sounds: Normal breath sounds. No rhonchi.  Musculoskeletal:     Right lower leg: Edema (trace) present.     Left lower leg: Edema (trace) present.  Skin:    General: Skin is warm and dry.  Neurological:     Mental Status: She is alert and oriented to person, place, and time.  Psychiatric:        Mood and Affect: Mood normal.        Behavior: Behavior normal.    BP (!) 162/90 (BP Location: Right Arm, Patient Position: Sitting, Cuff Size: Large)    Pulse 87    Temp (!) 97.3 F (36.3 C) (Temporal)    Ht _0  (1.651 m)    Wt (!) 338 lb 9.6 oz (153.6 kg)    SpO2 95%    BMI 56.35 kg/m  Wt Readings from Last 3 Encounters:  03/28/21 (!) 338 lb 9.6 oz (153.6 kg)  03/25/21 (!) 347 lb (157.4 kg)  02/07/21 (!) 359 lb 9.6 oz (163.1 kg)     Health Maintenance Due  Topic Date Due   PAP SMEAR-Modifier  08/23/2013   COLONOSCOPY (Pts 45-26yr Insurance coverage will need to be confirmed)  Never done    There are no preventive care reminders to display for this patient.  Lab Results  Component Value Date   TSH 5.88 (H) 02/07/2021   Lab Results  Component Value Date   WBC 2.6 (L) 11/08/2020   HGB 12.2 11/08/2020   HCT 37.9 11/08/2020   MCV 108.8 (H) 11/08/2020   PLT 347.0 11/08/2020   Lab Results  Component Value Date   NA 134 03/25/2021   K 4.9 03/25/2021   CO2 20 03/25/2021   GLUCOSE 79 03/25/2021   BUN 9 03/25/2021   CREATININE 0.69 03/25/2021   BILITOT 0.9 11/08/2020   ALKPHOS 65 11/08/2020   AST 53 (H) 11/08/2020   ALT 31 11/08/2020   PROT 8.1 11/08/2020   ALBUMIN 4.1 11/08/2020   CALCIUM 9.7 03/25/2021   ANIONGAP 14 07/28/2020   EGFR 109 03/25/2021    GFR 90.44 11/08/2020  Lab Results  Component Value Date   CHOL 228 (H) 11/08/2020   Lab Results  Component Value Date   HDL 78.00 11/08/2020   Lab Results  Component Value Date   LDLCALC 138 (H) 11/08/2020   Lab Results  Component Value Date   TRIG 59.0 11/08/2020   Lab Results  Component Value Date   CHOLHDL 3 11/08/2020   Lab Results  Component Value Date   HGBA1C 5.3 08/08/2013      Assessment & Plan:   Problem List Items Addressed This Visit       Cardiovascular and Mediastinum   Venous insufficiency - Primary   Relevant Orders   Ambulatory referral to Vascular Surgery     Endocrine   Acquired hypothyroidism   Relevant Medications   levothyroxine (EUTHYROX) 112 MCG tablet     Other   Vitamin D deficiency   Other Visit Diagnoses     B12 deficiency       Relevant Medications   vitamin B-12 (CYANOCOBALAMIN) 1000 MCG tablet       Meds ordered this encounter  Medications   levothyroxine (EUTHYROX) 112 MCG tablet    Sig: Take 1 tablet (112 mcg total) by mouth daily before breakfast.    Dispense:  90 tablet    Refill:  1   vitamin B-12 (CYANOCOBALAMIN) 1000 MCG tablet    Sig: Take 1 tablet (1,000 mcg total) by mouth daily.    Dispense:  90 tablet    Refill:  1    Follow-up: Return in about 3 months (around 06/26/2021).   Will refer patient to vein specialist to be fitted for custom stockings.  Have increased Euthyrox to lower TSH, this may help with swelling.  Continue 1000 mcg of daily cyanocobalamin.  May need monthly injections.  Continue high-dose vitamin D. Libby Maw, MD

## 2021-04-01 ENCOUNTER — Ambulatory Visit: Payer: 59

## 2021-04-04 ENCOUNTER — Telehealth: Payer: Self-pay

## 2021-04-04 NOTE — Telephone Encounter (Addendum)
Called patient regarding results. Patient had understanding of results.----- Message from Lendon Colonel, NP sent at 03/29/2021  2:41 PM EST ----- I have reviewed her labs. All are normal. No changes or concerns.   KL

## 2021-04-25 ENCOUNTER — Other Ambulatory Visit: Payer: Self-pay

## 2021-04-26 ENCOUNTER — Ambulatory Visit: Payer: 59 | Admitting: Family Medicine

## 2021-04-28 ENCOUNTER — Ambulatory Visit: Payer: 59

## 2021-05-02 ENCOUNTER — Ambulatory Visit (INDEPENDENT_AMBULATORY_CARE_PROVIDER_SITE_OTHER): Payer: 59 | Admitting: Pharmacist

## 2021-05-02 ENCOUNTER — Encounter: Payer: Self-pay | Admitting: Pharmacist

## 2021-05-02 ENCOUNTER — Telehealth: Payer: Self-pay | Admitting: Family Medicine

## 2021-05-02 ENCOUNTER — Telehealth: Payer: Self-pay | Admitting: Cardiology

## 2021-05-02 ENCOUNTER — Other Ambulatory Visit: Payer: Self-pay

## 2021-05-02 VITALS — BP 148/86 | HR 96 | Resp 18 | Ht 65.0 in | Wt 334.0 lb

## 2021-05-02 DIAGNOSIS — I160 Hypertensive urgency: Secondary | ICD-10-CM | POA: Diagnosis not present

## 2021-05-02 DIAGNOSIS — I10 Essential (primary) hypertension: Secondary | ICD-10-CM

## 2021-05-02 MED ORDER — TORSEMIDE 20 MG PO TABS: 40.0000 mg | ORAL_TABLET | Freq: Every day | ORAL | 1 refills | Status: DC

## 2021-05-02 MED ORDER — CARVEDILOL 25 MG PO TABS: ORAL_TABLET | ORAL | 1 refills | Status: DC

## 2021-05-02 NOTE — Progress Notes (Signed)
Patient ID: Hannah Orr                 DOB: 1975/04/17                      MRN: [id number] ? ? ? ? ?HPI: ?Hannah Orr is a 46 y.o. female referred by Dr. Percival Spanish to HTN clinic. PMH is significant for HTN, hypothyroidism, OSA, and venous insufficiency.  At last visit with Dr Percival Spanish, there was confusion regarding medication regimen and she was referred to HTN clinic. ? ?Patient presents today with medication bottles. Brought torsemide, amlodipine, and spironolactone. Did not bring any carvedilol.  Is unclear about which medications she is supposed to be taking so she reports she has not been taking anything except levothyroxine and fluoxetine. ? ?Both legs are extremely swollen and uncomfortable.  Has to walk with a cane and is very slow moving. Is not checking blood pressure at home.  PCP referred patient to vascular surgery.  Remains off of work. ? ?Current HTN meds:  ?Carvedilol '25mg'$  BID (not taking, does not have) ?Torsemide '40mg'$  daily (not taking) ?Amlodipine (unclear if taking - had been d/c) ?Spironolactone (unclear if taking - had been d/c) ? ?Previously tried:  ?HCTZ ?Chlorthalidone ?Amlodipine ? ?BP goal: <130/80 ? ? ?Wt Readings from Last 3 Encounters:  ?03/28/21 (!) 338 lb 9.6 oz (153.6 kg)  ?03/25/21 (!) 347 lb (157.4 kg)  ?02/07/21 (!) 359 lb 9.6 oz (163.1 kg)  ? ?BP Readings from Last 3 Encounters:  ?03/28/21 (!) 162/90  ?03/25/21 (!) 166/100  ?02/07/21 (!) 158/92  ? ?Pulse Readings from Last 3 Encounters:  ?03/28/21 87  ?03/25/21 91  ?02/07/21 85  ? ? ?Renal function: ?CrCl cannot be calculated (Patient's most recent lab result is older than the maximum 21 days allowed.). ? ?Past Medical History:  ?Diagnosis Date  ? Depression   ? Hypertension   ? ? ?Current Outpatient Medications on File Prior to Visit  ?Medication Sig Dispense Refill  ? amLODipine (NORVASC) 5 MG tablet Take 1 tablet (5 mg total) by mouth daily. 90 tablet 0  ? carvedilol (COREG) 25 MG tablet Take 1 tablet (25 mg total)  by mouth 2 (two) times daily with a meal. 180 tablet 3  ? FLUoxetine (PROZAC) 40 MG capsule Take 1 capsule (40 mg total) by mouth daily. 90 capsule 1  ? gabapentin (NEURONTIN) 400 MG capsule Take 1 capsule (400 mg total) by mouth at bedtime. 90 capsule 1  ? levothyroxine (EUTHYROX) 112 MCG tablet Take 1 tablet (112 mcg total) by mouth daily before breakfast. 90 tablet 1  ? torsemide (DEMADEX) 20 MG tablet Take 2 tablets (40 mg total) by mouth daily. 180 tablet 3  ? traZODone (DESYREL) 50 MG tablet Take 0.5-1 tablets (25-50 mg total) by mouth at bedtime as needed for sleep. 30 tablet 3  ? vitamin B-12 (CYANOCOBALAMIN) 1000 MCG tablet Take 1 tablet (1,000 mcg total) by mouth daily. 90 tablet 1  ? Vitamin D, Ergocalciferol, (DRISDOL) 1.25 MG (50000 UNIT) CAPS capsule Take 1 capsule (50,000 Units total) by mouth every 7 (seven) days. 15 capsule 1  ? ?No current facility-administered medications on file prior to visit.  ? ? ?Allergies  ?Allergen Reactions  ? Benazepril Hcl Hypertension  ?  cough  ? Benazepril Hcl   ?  cough  ? ? ? ?Assessment/Plan: ? ?1. Hypertension -  Patient BP in room 148/86 which is above goal OF <130/80 however it is  unclear what antihypertensives patient is taking, if any. ? ?Went through each medication patient had and wrote on her bottles what each medication was for and how frequently she needed to take it. ? ?Patient did not have any carvedilol and was not aware she was supposed to be taking it.  Sent in Rx to pharmacy. ? ?Since patient's legs are extremely swollen and painful, would benefit from restarting torsemide. Advised she can take two '20mg'$  tablets to relieve edema.  Even though spironolactone had previously been d/c, would benefit from restarting for the time being due to elevated BP.   ? ?Patient will need close follow up.  Currently has appt with Jory Sims in 4 weeks.  Will see back in HTN clinic in 2 weeks to assess before seeing NP and possible update labs ? ?Restart  torsemide '40mg'$  daily ?Restart spironolactone '50mg'$  daily ?Start carvedilol '25mg'$  BID ?Recheck in 2 weeks ? ?Karren Cobble, PharmD, BCACP, [place], CPP ?[address], [address] ?[place], [place], [address] ?Phone: [phone], Fax: [phone]  ?

## 2021-05-02 NOTE — Telephone Encounter (Signed)
05/02/2021 ? ?Disability forms received, and put in Dr.Hochrein box.  ?

## 2021-05-02 NOTE — Patient Instructions (Addendum)
It was nice meeting you today ? ?We would like your blood pressure to be less than 130/80 ? ?Because of your swelling, please restart your torsemide '40mg'$ , two tablets in the morning ? ?Please restart your spironolactone '50mg'$  daily ? ?I have sent a refill of your carvedilol '25mg'$  twice a day ? ?Please bring your blood pressure cuff with you for the next appointment ? ?Karren Cobble, PharmD, BCACP, [place], CPP ?[address], [address] ?[place], [place], [address] ?Phone: [phone], Fax: [phone]  ? ? ?

## 2021-05-02 NOTE — Telephone Encounter (Signed)
We received by fax a form for Dr Ethelene Hal to fill out, I placed form with charge sheet in Dr Avel Peace folder. Since we received through fax pt has not signed charge form which will be needed if charge for filling out form is required.  ?

## 2021-05-03 NOTE — Telephone Encounter (Signed)
Notes printed off to be faxed with forms. Pending to be viewed and signed.  ?

## 2021-05-04 ENCOUNTER — Encounter: Payer: Self-pay | Admitting: Pharmacist

## 2021-05-09 ENCOUNTER — Telehealth: Payer: Self-pay

## 2021-05-09 ENCOUNTER — Ambulatory Visit (INDEPENDENT_AMBULATORY_CARE_PROVIDER_SITE_OTHER): Payer: 59 | Admitting: Family Medicine

## 2021-05-09 ENCOUNTER — Other Ambulatory Visit: Payer: Self-pay

## 2021-05-09 ENCOUNTER — Encounter: Payer: Self-pay | Admitting: Family Medicine

## 2021-05-09 VITALS — BP 158/92 | HR 86 | Temp 97.9°F | Ht 65.0 in | Wt 346.0 lb

## 2021-05-09 DIAGNOSIS — E538 Deficiency of other specified B group vitamins: Secondary | ICD-10-CM

## 2021-05-09 DIAGNOSIS — E039 Hypothyroidism, unspecified: Secondary | ICD-10-CM

## 2021-05-09 DIAGNOSIS — E559 Vitamin D deficiency, unspecified: Secondary | ICD-10-CM

## 2021-05-09 DIAGNOSIS — Z6841 Body Mass Index (BMI) 40.0 and over, adult: Secondary | ICD-10-CM

## 2021-05-09 DIAGNOSIS — F324 Major depressive disorder, single episode, in partial remission: Secondary | ICD-10-CM

## 2021-05-09 DIAGNOSIS — G629 Polyneuropathy, unspecified: Secondary | ICD-10-CM

## 2021-05-09 LAB — BASIC METABOLIC PANEL
BUN: 10 mg/dL (ref 6–23)
CO2: 22 mEq/L (ref 19–32)
Calcium: 9.5 mg/dL (ref 8.4–10.5)
Chloride: 96 mEq/L (ref 96–112)
Creatinine, Ser: 0.79 mg/dL (ref 0.40–1.20)
GFR: 90.13 mL/min (ref >60.00)
Glucose, Bld: 95 mg/dL (ref 70–99)
Potassium: 3.9 mEq/L (ref 3.5–5.1)
Sodium: 133 mEq/L — ABNORMAL LOW (ref 135–145)

## 2021-05-09 LAB — TSH: TSH: 4.05 u[IU]/mL (ref 0.35–5.50)

## 2021-05-09 LAB — VITAMIN B12: Vitamin B-12: 180 pg/mL — ABNORMAL LOW (ref 211–911)

## 2021-05-09 LAB — VITAMIN D 25 HYDROXY (VIT D DEFICIENCY, FRACTURES): VITD: 26.74 ng/mL — ABNORMAL LOW (ref 30.00–100.00)

## 2021-05-09 MED ORDER — GABAPENTIN 400 MG PO CAPS: 400.0000 mg | ORAL_CAPSULE | Freq: Every day | ORAL | 1 refills | Status: DC

## 2021-05-09 MED ORDER — BUPROPION HCL ER (XL) 150 MG PO TB24: 150.0000 mg | ORAL_TABLET | Freq: Every day | ORAL | 2 refills | Status: DC

## 2021-05-09 MED ORDER — BUPROPION HCL ER (SR) 150 MG PO TB12: 150.0000 mg | ORAL_TABLET | Freq: Two times a day (BID) | ORAL | 2 refills | Status: DC

## 2021-05-09 NOTE — Telephone Encounter (Signed)
Pt was seen today, 05/09/21. ?She picked up the gabapentin called in and Wellbutrin. She thought Prozac was to be called in too.Marland Kitchen ?Pharmacy is [facility] ?[place] ?CB#[phone] ? ? ? ? ?

## 2021-05-09 NOTE — Progress Notes (Incomplete)
Established Patient Office Visit  Subjective:  Patient ID: Hannah Orr, female    DOB: Jan 29, 1976  Age: 46 y.o. MRN: [id number]  CC:  Chief Complaint  Patient presents with   Follow-up    3 month follow up, sharp pains in legs at night while sleeping.     HPI Hannah Orr presents for for follow-up.  She is now seeing cardiology for hypertension.  She describes burning and stinging pains in her legs that have woken her up at night.  She has been out of the gabapentin.  She has been taking her levothyroxine as directed every day.  She has been taking her weekly dose of vitamin D.  Not currently taking a vitamin B12 supplement but has been told its been low in the past.  She has a new insurance.  Her prior insurance refused to authorize nutritional consultation and a consultation for bariatric surgery.  She is hopeful her new insurance may approve it.  She welled up in the room over frustration over her current predicament.  She is not certain that the Prozac is enough to help her sadness.  She denies any thoughts of self-harm.  Past Medical History:  Diagnosis Date   Depression    Hypertension     Past Surgical History:  Procedure Laterality Date   MANDIBLE SURGERY Right 12/11/1993    Family History  Problem Relation Age of Onset   Hypertension Mother    Breast cancer Mother    Hypertension Father    Prostate cancer Father    Cancer Neg Hx    Heart disease Neg Hx    Hyperlipidemia Neg Hx    Kidney disease Neg Hx    Diabetes Neg Hx    Early death Neg Hx    Hearing loss Neg Hx    Stroke Neg Hx     Social History   Socioeconomic History   Marital status: Single    Spouse name: Not on file   Number of children: Not on file   Years of education: Not on file   Highest education level: Not on file  Occupational History   Not on file  Tobacco Use   Smoking status: Never   Smokeless tobacco: Never  Vaping Use   Vaping Use: Never used   Substance and Sexual Activity   Alcohol use: Not Currently    Comment: Stopped drinking December 2021.    Drug use: No   Sexual activity: Yes    Birth control/protection: Condom  Other Topics Concern   Not on file  Social History Narrative   Lives at home with mom.   Social Determinants of Health   Financial Resource Strain: Not on file  Food Insecurity: Not on file  Transportation Needs: Not on file  Physical Activity: Not on file  Stress: Not on file  Social Connections: Not on file  Intimate Partner Violence: Not on file    Outpatient Medications Prior to Visit  Medication Sig Dispense Refill   carvedilol (COREG) 25 MG tablet Take 1 tablet by mouth twice a day for blood pressure 180 tablet 1   FLUoxetine (PROZAC) 40 MG capsule Take 1 capsule (40 mg total) by mouth daily. 90 capsule 1   levothyroxine (EUTHYROX) 112 MCG tablet Take 1 tablet (112 mcg total) by mouth daily before breakfast. 90 tablet 1   spironolactone (ALDACTONE) 50 MG tablet Take 50 mg by mouth daily.     torsemide (DEMADEX) 20 MG tablet Take 2 tablets (  40 mg total) by mouth daily. 180 tablet 1   Vitamin D, Ergocalciferol, (DRISDOL) 1.25 MG (50000 UNIT) CAPS capsule Take 1 capsule (50,000 Units total) by mouth every 7 (seven) days. 15 capsule 1   traZODone (DESYREL) 50 MG tablet Take 0.5-1 tablets (25-50 mg total) by mouth at bedtime as needed for sleep. (Patient not taking: Reported on 05/02/2021) 30 tablet 3   No facility-administered medications prior to visit.    Allergies  Allergen Reactions   Benazepril Hcl Hypertension    cough   Benazepril Hcl     cough    ROS Review of Systems  Constitutional: Negative.   Respiratory: Negative.    Cardiovascular: Negative.   Gastrointestinal: Negative.   Genitourinary: Negative.   Neurological:  Negative for speech difficulty and weakness.  Psychiatric/Behavioral:  Positive for dysphoric mood. Negative for self-injury and suicidal ideas.       Objective:    Physical Exam Vitals and nursing note reviewed.  Constitutional:      General: She is not in acute distress.    Appearance: Normal appearance. She is obese. She is not ill-appearing, toxic-appearing or diaphoretic.  HENT:     Head: Normocephalic and atraumatic.     Right Ear: External ear normal.     Left Ear: External ear normal.  Eyes:     General: No scleral icterus.       Right eye: No discharge.        Left eye: No discharge.     Extraocular Movements: Extraocular movements intact.     Conjunctiva/sclera: Conjunctivae normal.  Cardiovascular:     Rate and Rhythm: Normal rate and regular rhythm.  Pulmonary:     Effort: Pulmonary effort is normal.     Breath sounds: Normal breath sounds.  Skin:    General: Skin is warm and dry.  Neurological:     Mental Status: She is alert and oriented to person, place, and time.  Psychiatric:        Mood and Affect: Mood normal.        Behavior: Behavior normal.    BP (!) 158/92 (BP Location: Right Arm, Patient Position: Sitting, Cuff Size: Large)    Pulse 86    Temp 97.9 F (36.6 C) (Temporal)    Ht _0  (1.651 m)    Wt (!) 346 lb (156.9 kg)    SpO2 94%    BMI 57.58 kg/m  Wt Readings from Last 3 Encounters:  05/09/21 (!) 346 lb (156.9 kg)  05/02/21 (!) 334 lb (151.5 kg)  03/28/21 (!) 338 lb 9.6 oz (153.6 kg)     There are no preventive care reminders to display for this patient.  There are no preventive care reminders to display for this patient.  Lab Results  Component Value Date   TSH 5.88 (H) 02/07/2021   Lab Results  Component Value Date   WBC 2.6 (L) 11/08/2020   HGB 12.2 11/08/2020   HCT 37.9 11/08/2020   MCV 108.8 (H) 11/08/2020   PLT 347.0 11/08/2020   Lab Results  Component Value Date   NA 134 03/25/2021   K 4.9 03/25/2021   CO2 20 03/25/2021   GLUCOSE 79 03/25/2021   BUN 9 03/25/2021   CREATININE 0.69 03/25/2021   BILITOT 0.9 11/08/2020   ALKPHOS 65 11/08/2020   AST 53 (H) 11/08/2020    ALT 31 11/08/2020   PROT 8.1 11/08/2020   ALBUMIN 4.1 11/08/2020   CALCIUM 9.7 03/25/2021   ANIONGAP 14  07/28/2020   EGFR 109 03/25/2021   GFR 90.44 11/08/2020   Lab Results  Component Value Date   CHOL 228 (H) 11/08/2020   Lab Results  Component Value Date   HDL 78.00 11/08/2020   Lab Results  Component Value Date   LDLCALC 138 (H) 11/08/2020   Lab Results  Component Value Date   TRIG 59.0 11/08/2020   Lab Results  Component Value Date   CHOLHDL 3 11/08/2020   Lab Results  Component Value Date   HGBA1C 5.3 08/08/2013      Assessment & Plan:   Problem List Items Addressed This Visit       Endocrine   Acquired hypothyroidism   Relevant Orders   TSH     Nervous and Auditory   Neuropathy   Relevant Medications   gabapentin (NEURONTIN) 400 MG capsule   Other Relevant Orders   Basic metabolic panel   TSH     Other   Morbid obesity with BMI of 50.0-59.9, adult ([place])   Relevant Orders   Amb ref to Medical Nutrition Therapy-MNT   Ambulatory referral to General Surgery   Vitamin D deficiency   Relevant Orders   VITAMIN D 25 Hydroxy (Vit-D Deficiency, Fractures)   Depression, major, single episode, in partial remission (HCC)   Relevant Medications   buPROPion (WELLBUTRIN XL) 150 MG 24 hr tablet   B12 deficiency - Primary   Relevant Orders   Vitamin B12    Meds ordered this encounter  Medications   gabapentin (NEURONTIN) 400 MG capsule    Sig: Take 1 capsule (400 mg total) by mouth at bedtime.    Dispense:  90 capsule    Refill:  1   DISCONTD: buPROPion (WELLBUTRIN SR) 150 MG 12 hr tablet    Sig: Take 1 tablet (150 mg total) by mouth 2 (two) times daily.    Dispense:  30 tablet    Refill:  2   buPROPion (WELLBUTRIN XL) 150 MG 24 hr tablet    Sig: Take 1 tablet (150 mg total) by mouth daily.    Dispense:  30 tablet    Refill:  2    Follow-up: Return in about 6 weeks (around 06/20/2021), or if symptoms worsen or fail to improve.  We will  augment Prozac therapy with 150 mg of bupropion.  Checking B12 levels.  Follow-up on vitamin D level.  Will refer for nutritional consult as well as a bariatric surgery consult.  Follow-up with cardiology in 1 month for hypertension.  Encouraged her to hang in there.  May also consider talking therapy.  Libby Maw, MD

## 2021-05-09 NOTE — Telephone Encounter (Signed)
Explained to patient that Prozac was not sent in due to patient stating that Prozac was no longer helping. Patient verbally understood Wellbutrin was sent in to see if that will help with symptoms. I called and spoke with pharmacist who states that patient picked up 4 prescriptions today Gabapentin and Wellbutrin that was sent in by Dr. Ethelene Hal today and Carvedilol and Torsemide which was sent in by another Provider on 05/02/21. Advised patient to call pharmacy.  ?

## 2021-05-10 ENCOUNTER — Ambulatory Visit: Payer: 59

## 2021-05-10 MED ORDER — VITAMIN B-12 1000 MCG PO TABS
1000.0000 ug | ORAL_TABLET | Freq: Every day | ORAL | 1 refills | Status: DC
Start: 2021-05-10 — End: 2021-06-24

## 2021-05-10 MED ORDER — VITAMIN D (ERGOCALCIFEROL) 1.25 MG (50000 UNIT) PO CAPS: 50000.0000 [IU] | ORAL_CAPSULE | ORAL | 1 refills | Status: DC

## 2021-05-12 NOTE — Telephone Encounter (Signed)
Spoke with pt, aware last office note has been faxed. ?

## 2021-05-12 NOTE — Telephone Encounter (Signed)
Patient is following up. She would like to know if we have updates on the disability form. Please advise. ?

## 2021-05-24 ENCOUNTER — Ambulatory Visit: Payer: 59

## 2021-06-01 NOTE — Progress Notes (Signed)
?Cardiology Clinic Note  ? ?Patient Name: Hannah Orr ?Date of Encounter: 06/03/2021 ? ?Primary Care Provider:  Libby Maw, MD ?Primary Cardiologist:  Minus Breeding, MD ? ?Patient Profile  ?  ?46 year old female with history of difficult to control hypertension, chronic lower extremity edema, thyroid disease, and depression.  She is not diuretic torsemide 40 mg daily.  She was referred to the hypertension clinic with the clinical Pharm.D.  If necessary addition of hydralazine would be added if blood pressure was not well controlled.  There was discussion on previous note about evaluation for OSA. ? ?Past Medical History  ?  ?Past Medical History:  ?Diagnosis Date  ? Depression   ? Hypertension   ? ?Past Surgical History:  ?Procedure Laterality Date  ? MANDIBLE SURGERY Right 12/11/1993  ? ? ?Allergies ? ?Allergies  ?Allergen Reactions  ? Benazepril Hcl Hypertension  ?  cough  ? Benazepril Hcl   ?  cough  ? ? ?History of Present Illness  ?  ?Hannah Orr is a 46 year old patient we are following for ongoing evaluation and assessment of hypertension, on carvedilol and diuretics.  Last seen by Dr. Percival Spanish and was hypertensive but had not taken medications prior to coming to the office.  No medication changes were made and she was to continue on torsemide spironolactone and carvedilol as directed.  Her main complaint today is that of bilateral knee pain with ambulation.  She is medically compliant but has not taken her medications prior to coming to this office visit. ? ?Home Medications  ?  ?Current Outpatient Medications  ?Medication Sig Dispense Refill  ? buPROPion (WELLBUTRIN XL) 150 MG 24 hr tablet Take 1 tablet (150 mg total) by mouth daily. 30 tablet 2  ? carvedilol (COREG) 25 MG tablet Take 1 tablet by mouth twice a day for blood pressure 180 tablet 1  ? FLUoxetine (PROZAC) 40 MG capsule Take 1 capsule (40 mg total) by mouth daily. 90 capsule 1  ? gabapentin (NEURONTIN) 400 MG capsule Take 1  capsule (400 mg total) by mouth at bedtime. 90 capsule 1  ? levothyroxine (EUTHYROX) 112 MCG tablet Take 1 tablet (112 mcg total) by mouth daily before breakfast. 90 tablet 1  ? spironolactone (ALDACTONE) 50 MG tablet Take 50 mg by mouth daily.    ? torsemide (DEMADEX) 20 MG tablet Take 2 tablets (40 mg total) by mouth daily. 180 tablet 1  ? vitamin B-12 (CYANOCOBALAMIN) 1000 MCG tablet Take 1 tablet (1,000 mcg total) by mouth daily. 90 tablet 1  ? Vitamin D, Ergocalciferol, (DRISDOL) 1.25 MG (50000 UNIT) CAPS capsule Take 1 capsule (50,000 Units total) by mouth every 7 (seven) days. 15 capsule 1  ? ?No current facility-administered medications for this visit.  ?  ? ?Family History  ?  ?Family History  ?Problem Relation Age of Onset  ? Hypertension Mother   ? Breast cancer Mother   ? Hypertension Father   ? Prostate cancer Father   ? Cancer Neg Hx   ? Heart disease Neg Hx   ? Hyperlipidemia Neg Hx   ? Kidney disease Neg Hx   ? Diabetes Neg Hx   ? Early death Neg Hx   ? Hearing loss Neg Hx   ? Stroke Neg Hx   ? ?She indicated that her mother is alive. She indicated that her father is deceased. She indicated that her sister is alive. She indicated that her brother is alive. She indicated that her maternal grandmother is  deceased. She indicated that her maternal grandfather is deceased. She indicated that her paternal grandmother is deceased. She indicated that her paternal grandfather is deceased. She indicated that the status of her neg hx is unknown. ? ?Social History  ?  ?Social History  ? ?Socioeconomic History  ? Marital status: Single  ?  Spouse name: Not on file  ? Number of children: Not on file  ? Years of education: Not on file  ? Highest education level: Not on file  ?Occupational History  ? Not on file  ?Tobacco Use  ? Smoking status: Never  ? Smokeless tobacco: Never  ?Vaping Use  ? Vaping Use: Never used  ?Substance and Sexual Activity  ? Alcohol use: Not Currently  ?  Comment: Stopped drinking December  2021.   ? Drug use: No  ? Sexual activity: Yes  ?  Birth control/protection: Condom  ?Other Topics Concern  ? Not on file  ?Social History Narrative  ? Lives at home with mom.  ? ?Social Determinants of Health  ? ?Financial Resource Strain: Not on file  ?Food Insecurity: Not on file  ?Transportation Needs: Not on file  ?Physical Activity: Not on file  ?Stress: Not on file  ?Social Connections: Not on file  ?Intimate Partner Violence: Not on file  ?  ? ?Review of Systems  ?  ?General:  No chills, fever, night sweats or weight changes.  ?Cardiovascular:  No chest pain, positive for mild dyspnea on exertion, edema, orthopnea, palpitations, paroxysmal nocturnal dyspnea. ?Dermatological: No rash, lesions/masses ?Respiratory: No cough, dyspnea ?Urologic: No hematuria, dysuria ?Abdominal:   No nausea, vomiting, diarrhea, bright red blood per rectum, melena, or hematemesis ?Neurologic:  No visual changes, wkns, changes in mental status. ?All other systems reviewed and are otherwise negative except as noted above. ? ?  ? ?Physical Exam  ?  ?VS:  BP (!) 142/88   Pulse 82   Ht '5\' 5"'$  (1.651 m)   Wt (!) 351 lb 6.4 oz (159.4 kg)   SpO2 98%   BMI 58.48 kg/m?  , BMI Body mass index is 58.48 kg/m?. ?    ?GEN: Well nourished, well developed, in no acute distress.  Morbidly obese ?HEENT: normal. ?Neck: Supple, no JVD, carotid bruits, or masses. ?Cardiac: RRR, no murmurs, rubs, or gallops. No clubbing, cyanosis, edema.  Radials/DP/PT 2+ and equal bilaterally.  ?Respiratory:  Respirations regular and unlabored, clear to auscultation bilaterally. ?GI: Soft, nontender, nondistended, BS + x 4. ?MS: no deformity or atrophy. ?Skin: warm and dry, no rash. ?Neuro:  Strength and sensation are intact. ?Psych: Normal affect. ? ?Accessory Clinical Findings  ?  ?ECG personally reviewed by me today-normal sinus rhythm, ventricular rate of 77 bpm- No acute changes ? ?Lab Results  ?Component Value Date  ? WBC 2.6 (L) 11/08/2020  ? HGB 12.2  11/08/2020  ? HCT 37.9 11/08/2020  ? MCV 108.8 (H) 11/08/2020  ? PLT 347.0 11/08/2020  ? ?Lab Results  ?Component Value Date  ? CREATININE 0.79 05/09/2021  ? BUN 10 05/09/2021  ? NA 133 (L) 05/09/2021  ? K 3.9 05/09/2021  ? CL 96 05/09/2021  ? CO2 22 05/09/2021  ? ?Lab Results  ?Component Value Date  ? ALT 31 11/08/2020  ? AST 53 (H) 11/08/2020  ? ALKPHOS 65 11/08/2020  ? BILITOT 0.9 11/08/2020  ? ?Lab Results  ?Component Value Date  ? CHOL 228 (H) 11/08/2020  ? HDL 78.00 11/08/2020  ? LDLCALC 138 (H) 11/08/2020  ? LDLDIRECT 132.0  11/08/2020  ? TRIG 59.0 11/08/2020  ? CHOLHDL 3 11/08/2020  ?  ?Lab Results  ?Component Value Date  ? HGBA1C 5.3 08/08/2013  ? ? ?Review of Prior Studies: ?Echocardiogram 05/13/2020 ? 1. Left ventricular ejection fraction, by estimation, is 60 to 65%. The  ?left ventricle has normal function. The left ventricle has no regional  ?wall motion abnormalities. Left ventricular diastolic parameters were  ?normal.  ? 2. Right ventricular systolic function is low normal. The right  ?ventricular size is normal.  ? 3. The mitral valve is grossly normal. No evidence of mitral valve  ?regurgitation.  ? 4. The aortic valve is normal in structure. Aortic valve regurgitation is  ?not visualized.  ? ?Assessment & Plan  ? ?1.  Hypertension: Blood pressure remains elevated on today's office visit but unable to qualify this as she has not taken her medications prior to come to the office.  I have advised her to please keep up with her blood pressures at home and report any elevations greater than 160/90 to our office.  She is avoiding salty foods in an effort to keep her blood pressure under control and is medically compliant. ? ?2.  Morbid obesity: She is to follow-up with her PCP to evaluate further concerning medical management with SGLT inhibition versus referral to bariatric center for surgical intervention. ? ?3.  OSA: Followed by PCP but has not had further testing completed at this time.  This may  need to be reconsidered if she is going to have surgical intervention for bariatric surgery versus medical management.  Blood pressure appears to be controlled although elevated today because she has not taken her me

## 2021-06-03 ENCOUNTER — Encounter: Payer: Self-pay | Admitting: Adult Health

## 2021-06-03 ENCOUNTER — Ambulatory Visit: Payer: 59 | Admitting: Adult Health

## 2021-06-03 VITALS — BP 142/88 | HR 82 | Ht 65.0 in | Wt 351.4 lb

## 2021-06-03 DIAGNOSIS — G4733 Obstructive sleep apnea (adult) (pediatric): Secondary | ICD-10-CM | POA: Diagnosis not present

## 2021-06-03 DIAGNOSIS — I10 Essential (primary) hypertension: Secondary | ICD-10-CM

## 2021-06-03 NOTE — Patient Instructions (Signed)
Medication Instructions:  ?No Changes ?*If you need a refill on your cardiac medications before your next appointment, please call your pharmacy* ? ? ?Lab Work: ?No labs ?If you have labs (blood work) drawn today and your tests are completely normal, you will receive your results only by: ?MyChart Message (if you have MyChart) OR ?A paper copy in the mail ?If you have any lab test that is abnormal or we need to change your treatment, we will call you to review the results. ? ? ?Testing/Procedures: ?No Testing ? ? ?Follow-Up: ?At [facility], you and your health needs are our priority.  As part of our continuing mission to provide you with exceptional heart care, we have created designated Provider Care Teams.  These Care Teams include your primary Cardiologist (physician) and Advanced Practice Providers (APPs -  Physician Assistants and Nurse Practitioners) who all work together to provide you with the care you need, when you need it. ? ?We recommend signing up for the patient portal called "MyChart".  Sign up information is provided on this After Visit Summary.  MyChart is used to connect with patients for Virtual Visits (Telemedicine).  Patients are able to view lab/test results, encounter notes, upcoming appointments, etc.  Non-urgent messages can be sent to your provider as well.   ?To learn more about what you can do with MyChart, go to [email or web address].   ? ?Your next appointment:   ?3 month(s) ? ?The format for your next appointment:   ?In Person ? ?Provider:   ?Minus Breeding, MD   ? ? ?  ?

## 2021-06-20 ENCOUNTER — Ambulatory Visit: Payer: 59 | Admitting: Family Medicine

## 2021-06-24 ENCOUNTER — Encounter: Payer: Self-pay | Admitting: Family Medicine

## 2021-06-24 ENCOUNTER — Ambulatory Visit (INDEPENDENT_AMBULATORY_CARE_PROVIDER_SITE_OTHER): Payer: 59 | Admitting: Family Medicine

## 2021-06-24 VITALS — BP 138/70 | HR 79 | Temp 97.6°F | Ht 65.0 in | Wt 359.8 lb

## 2021-06-24 DIAGNOSIS — F324 Major depressive disorder, single episode, in partial remission: Secondary | ICD-10-CM

## 2021-06-24 DIAGNOSIS — E538 Deficiency of other specified B group vitamins: Secondary | ICD-10-CM | POA: Diagnosis not present

## 2021-06-24 DIAGNOSIS — E039 Hypothyroidism, unspecified: Secondary | ICD-10-CM | POA: Diagnosis not present

## 2021-06-24 DIAGNOSIS — G629 Polyneuropathy, unspecified: Secondary | ICD-10-CM

## 2021-06-24 DIAGNOSIS — E559 Vitamin D deficiency, unspecified: Secondary | ICD-10-CM

## 2021-06-24 DIAGNOSIS — M25562 Pain in left knee: Secondary | ICD-10-CM

## 2021-06-24 MED ORDER — GABAPENTIN 400 MG PO CAPS: 400.0000 mg | ORAL_CAPSULE | Freq: Every day | ORAL | 1 refills | Status: DC

## 2021-06-24 MED ORDER — BUPROPION HCL ER (XL) 150 MG PO TB24: 150.0000 mg | ORAL_TABLET | Freq: Every day | ORAL | 2 refills | Status: DC

## 2021-06-24 MED ORDER — LEVOTHYROXINE SODIUM 112 MCG PO TABS: 112.0000 ug | ORAL_TABLET | Freq: Every day | ORAL | 1 refills | Status: DC

## 2021-06-24 MED ORDER — VITAMIN D (ERGOCALCIFEROL) 1.25 MG (50000 UNIT) PO CAPS: 50000.0000 [IU] | ORAL_CAPSULE | ORAL | 1 refills | Status: DC

## 2021-06-24 MED ORDER — FLUOXETINE HCL 40 MG PO CAPS: 40.0000 mg | ORAL_CAPSULE | Freq: Every day | ORAL | 1 refills | Status: AC

## 2021-06-24 MED ORDER — CYANOCOBALAMIN 1000 MCG/ML IJ SOLN
1000.0000 ug | Freq: Once | INTRAMUSCULAR | Status: AC
  Administered 2021-06-24: 1000 ug via INTRAMUSCULAR

## 2021-06-24 MED ORDER — VITAMIN B-12 1000 MCG PO TABS: 1000.0000 ug | ORAL_TABLET | Freq: Every day | ORAL | 1 refills | Status: DC

## 2021-06-24 NOTE — Progress Notes (Signed)
? ?Established Patient Office Visit ? ?Subjective   ?Patient ID: Hannah Orr, female    DOB: 05/06/1975  Age: 46 y.o. MRN: [id number] ? ?Chief Complaint  ?Patient presents with  ? Follow-up  ?  3 month follow up patient had a fall 4 days ago knee and leg pains both legs.   ? ? ?HPI for follow-up of depression, B12 deficiency and right knee pain.  She has ongoing pain in her knees and a recent fall in her bedroom did not help.  She is ambulating without difficulty and is followed by orthopedics for her knee issues.  She is taking an over-the-counter B12 tablet but not certain as to what it is.  She has taken B12 injections in the past.  She decided to discontinue the Prozac and has been taking the Wellbutrin only.  She has noticed increase in anxiety since stopping the Prozac. ? ? ? ?Review of Systems  ?Constitutional:  Negative for chills, diaphoresis, malaise/fatigue and weight loss.  ?HENT: Negative.    ?Eyes: Negative.  Negative for blurred vision and double vision.  ?Respiratory: Negative.    ?Cardiovascular:  Negative for chest pain.  ?Gastrointestinal:  Negative for abdominal pain.  ?Genitourinary: Negative.   ?Musculoskeletal:  Positive for joint pain. Negative for falls and myalgias.  ?Neurological:  Positive for tingling. Negative for speech change, loss of consciousness and weakness.  ? ?  ?Objective:  ?  ? ?BP 138/70 (BP Location: Right Arm, Patient Position: Sitting, Cuff Size: Large)   Pulse 79   Temp 97.6 ?F (36.4 ?C) (Temporal)   Ht '5\' 5"'$  (1.651 m)   Wt (!) 359 lb 12.8 oz (163.2 kg)   SpO2 96%   BMI 59.87 kg/m?  ? ? ?Physical Exam ?Constitutional:   ?   General: She is not in acute distress. ?   Appearance: Normal appearance. She is not ill-appearing, toxic-appearing or diaphoretic.  ?HENT:  ?   Head: Normocephalic and atraumatic.  ?   Right Ear: External ear normal.  ?   Left Ear: External ear normal.  ?   Mouth/Throat:  ?   Mouth: Mucous membranes are moist.  ?   Pharynx: Oropharynx is  clear. No oropharyngeal exudate or posterior oropharyngeal erythema.  ?Eyes:  ?   General: No scleral icterus.    ?   Right eye: No discharge.     ?   Left eye: No discharge.  ?   Extraocular Movements: Extraocular movements intact.  ?   Conjunctiva/sclera: Conjunctivae normal.  ?   Pupils: Pupils are equal, round, and reactive to light.  ?Cardiovascular:  ?   Rate and Rhythm: Normal rate and regular rhythm.  ?Pulmonary:  ?   Effort: Pulmonary effort is normal. No respiratory distress.  ?   Breath sounds: Normal breath sounds.  ?Abdominal:  ?   General: Bowel sounds are normal.  ?   Tenderness: There is no abdominal tenderness. There is no guarding.  ?Musculoskeletal:  ?   Cervical back: No rigidity or tenderness.  ?   Left knee: Ecchymosis present.  ?Skin: ?   General: Skin is warm and dry.  ?Neurological:  ?   Mental Status: She is alert and oriented to person, place, and time.  ?Psychiatric:     ?   Mood and Affect: Mood normal.     ?   Behavior: Behavior normal.  ? ? ? ?No results found for any visits on 06/24/21. ? ? ? ?The 10-year ASCVD risk score (  Arnett DK, et al., 2019) is: 1.9% ? ?  ?Assessment & Plan:  ? ?Problem List Items Addressed This Visit   ? ?  ? Endocrine  ? Acquired hypothyroidism  ? Relevant Medications  ? levothyroxine (EUTHYROX) 112 MCG tablet  ?  ? Nervous and Auditory  ? Neuropathy  ? Relevant Medications  ? gabapentin (NEURONTIN) 400 MG capsule  ? vitamin B-12 (CYANOCOBALAMIN) 1000 MCG tablet  ?  ? Other  ? Vitamin D deficiency  ? Relevant Medications  ? Vitamin D, Ergocalciferol, (DRISDOL) 1.25 MG (50000 UNIT) CAPS capsule  ? Depression, major, single episode, in partial remission ([place])  ? Relevant Medications  ? buPROPion (WELLBUTRIN XL) 150 MG 24 hr tablet  ? FLUoxetine (PROZAC) 40 MG capsule  ? B12 deficiency - Primary  ? Relevant Medications  ? vitamin B-12 (CYANOCOBALAMIN) 1000 MCG tablet  ? Knee pain  ? ? ?Return in about 3 months (around 09/26/2021), or Follow-up with orthopedics for  knee pain..  ? ?Patient will take fluoxetine and bupropion together.  She will take a 1000 mcg B12 tablet every day.  She will continue her weekly high-dose vitamin D tablet.  She understands that the tingling in her legs and any difficulty walking could be coming from her B12 deficiency.  We will take B12 daily for 3 months.  If her levels do not elevate will probably need injection on a monthly basis.  Given information on B12 deficiency.  Continue Neurontin at night only for now. ?Libby Maw, MD ? ?

## 2021-08-03 ENCOUNTER — Ambulatory Visit: Payer: 59 | Admitting: Nurse Practitioner

## 2021-08-08 ENCOUNTER — Ambulatory Visit (INDEPENDENT_AMBULATORY_CARE_PROVIDER_SITE_OTHER): Payer: 59 | Admitting: Vascular Surgery

## 2021-08-08 ENCOUNTER — Encounter (INDEPENDENT_AMBULATORY_CARE_PROVIDER_SITE_OTHER): Payer: Self-pay | Admitting: Vascular Surgery

## 2021-08-08 VITALS — BP 178/79 | HR 78 | Resp 17 | Ht 65.0 in | Wt 348.0 lb

## 2021-08-08 DIAGNOSIS — M79604 Pain in right leg: Secondary | ICD-10-CM | POA: Diagnosis not present

## 2021-08-08 DIAGNOSIS — I872 Venous insufficiency (chronic) (peripheral): Secondary | ICD-10-CM | POA: Diagnosis not present

## 2021-08-08 DIAGNOSIS — I890 Lymphedema, not elsewhere classified: Secondary | ICD-10-CM

## 2021-08-08 DIAGNOSIS — M79605 Pain in left leg: Secondary | ICD-10-CM

## 2021-08-08 DIAGNOSIS — I10 Essential (primary) hypertension: Secondary | ICD-10-CM | POA: Diagnosis not present

## 2021-08-08 DIAGNOSIS — K219 Gastro-esophageal reflux disease without esophagitis: Secondary | ICD-10-CM

## 2021-08-08 NOTE — Progress Notes (Signed)
MRN : [id number]  Hannah Orr is a 46 y.o. (1975-05-18) female who presents with chief complaint of legs swell.  History of Present Illness:   Patient is seen for evaluation of leg pain and leg swelling. The patient first noticed the swelling remotely. The swelling is associated with pain and discoloration. The pain and swelling worsens with prolonged dependency and improves with elevation. The pain seems unrelated to activity.  The patient also notes a steady worsening of the discoloration in the ankle and shin area.   The patient denies claudication symptoms.  The patient denies symptoms consistent with rest pain.  The patient denies a history of DJD and LS spine disease.  She is also noting a sharp pain in the feet that wakes her up in the middle of the night.   It doesn't happen every night.  She is also noting painful numbness in both hands.  She is also noting balance issues and leg weakness.  The patient has no had any past angiography, interventions or vascular surgery.  Elevation makes the leg symptoms better, dependency makes them much worse. There is no history of ulcerations. The patient denies any recent changes in medications.  The patient has not been wearing graduated compression.  The patient denies a history of DVT or PE. There is no prior history of phlebitis. There is no history of primary lymphedema.  No history of malignancies. No history of trauma or groin or pelvic surgery. There is no history of radiation treatment to the groin or pelvis    No outpatient medications have been marked as taking for the 08/08/21 encounter (Appointment) with Delana Meyer, Dolores Lory, MD.    Past Medical History:  Diagnosis Date   Depression    Hypertension     Past Surgical History:  Procedure Laterality Date   MANDIBLE SURGERY Right 12/11/1993    Social History Social History   Tobacco Use   Smoking status: Never   Smokeless tobacco: Never  Vaping Use    Vaping Use: Never used  Substance Use Topics   Alcohol use: Not Currently    Comment: Stopped drinking December 2021.    Drug use: No    Family History Family History  Problem Relation Age of Onset   Hypertension Mother    Breast cancer Mother    Hypertension Father    Prostate cancer Father    Cancer Neg Hx    Heart disease Neg Hx    Hyperlipidemia Neg Hx    Kidney disease Neg Hx    Diabetes Neg Hx    Early death Neg Hx    Hearing loss Neg Hx    Stroke Neg Hx     Allergies  Allergen Reactions   Benazepril Hcl Hypertension    cough   Benazepril Hcl     cough     REVIEW OF SYSTEMS (Negative unless checked)  Constitutional: '[]'$ Weight loss  '[]'$ Fever  '[]'$ Chills Cardiac: '[]'$ Chest pain   '[]'$ Chest pressure   '[]'$ Palpitations   '[]'$ Shortness of breath when laying flat   '[]'$ Shortness of breath with exertion. Vascular:  '[]'$ Pain in legs with walking   '[x]'$ Pain in legs with standing  '[]'$ History of DVT   '[]'$ Phlebitis   '[x]'$ Swelling in legs   '[]'$ Varicose veins   '[]'$ Non-healing ulcers Pulmonary:   '[]'$ Uses home oxygen   '[]'$ Productive cough   '[]'$ Hemoptysis   '[]'$ Wheeze  '[]'$ COPD   '[]'$ Asthma Neurologic:  '[]'$ Dizziness   '[]'$ Seizures   '[]'$ History of stroke   '[]'$ History  of TIA  '[]'$ Aphasia   '[]'$ Vissual changes   '[x]'$ Weakness or numbness in arm   '[x]'$ Weakness or numbness in leg Musculoskeletal:   '[]'$ Joint swelling   '[]'$ Joint pain   '[]'$ Low back pain Hematologic:  '[]'$ Easy bruising  '[]'$ Easy bleeding   '[]'$ Hypercoagulable state   '[]'$ Anemic Gastrointestinal:  '[]'$ Diarrhea   '[]'$ Vomiting  '[x]'$ Gastroesophageal reflux/heartburn   '[]'$ Difficulty swallowing. Genitourinary:  '[]'$ Chronic kidney disease   '[]'$ Difficult urination  '[]'$ Frequent urination   '[]'$ Blood in urine Skin:  '[]'$ Rashes   '[]'$ Ulcers  Psychological:  '[]'$ History of anxiety   '[]'$  History of major depression.  Physical Examination  There were no vitals filed for this visit. There is no height or weight on file to calculate BMI. Gen: WD/WN, mild distress walking with two canes Head: [place]/AT, No  temporalis wasting.  Ear/Nose/Throat: Hearing grossly intact, nares w/o erythema or drainage, pinna without lesions Eyes: PER, EOMI, sclera nonicteric.  Neck: Supple, no gross masses.  No JVD.  Pulmonary:  Good air movement, no audible wheezing, no use of accessory muscles.  Cardiac: RRR, precordium not hyperdynamic. Vascular:  scattered small varicosities present bilaterally.  Severe venous stasis changes to the legs bilaterally.  3-4+ hard pitting edema  Vessel Right Left  Radial Palpable Palpable  Gastrointestinal: soft, non-distended. No guarding/no peritoneal signs.  Musculoskeletal: M/S 5/5 throughout.  No deformity.  Neurologic: CN 2-12 intact. Pain and light touch intact in extremities.  Symmetrical.  Speech is fluent. Motor exam as listed above. Psychiatric: Judgment intact, Mood & affect appropriate for pt's clinical situation. Dermatologic: Severe venous rashes, no ulcers noted.  No changes consistent with cellulitis. Lymph : + lichenification and skin changes of chronic lymphedema.  CBC Lab Results  Component Value Date   WBC 2.6 (L) 11/08/2020   HGB 12.2 11/08/2020   HCT 37.9 11/08/2020   MCV 108.8 (H) 11/08/2020   PLT 347.0 11/08/2020    BMET    Component Value Date/Time   NA 133 (L) 05/09/2021 1152   NA 134 03/25/2021 1412   K 3.9 05/09/2021 1152   CL 96 05/09/2021 1152   CO2 22 05/09/2021 1152   GLUCOSE 95 05/09/2021 1152   BUN 10 05/09/2021 1152   BUN 9 03/25/2021 1412   CREATININE 0.79 05/09/2021 1152   CREATININE 0.75 07/28/2020 1205   CALCIUM 9.5 05/09/2021 1152   GFRNONAA >60 07/28/2020 1205   CrCl cannot be calculated (Patient's most recent lab result is older than the maximum 21 days allowed.).  COAG No results found for: "INR", "PROTIME"  Radiology No results found.   Assessment/Plan 1. Venous insufficiency Recommend:  I have had a long discussion with the patient regarding swelling and why it  causes symptoms.  Patient will begin wearing  graduated compression on a daily basis a prescription was given. The patient will  wear the stockings first thing in the morning and removing them in the evening. The patient is instructed specifically not to sleep in the stockings.   In addition, behavioral modification will be initiated.  This will include frequent elevation, use of over the counter pain medications and exercise such as walking.  Consideration for a lymph pump will also be made based upon the effectiveness of conservative therapy.  This would help to improve the edema control and prevent sequela such as ulcers and infections   Patient should undergo duplex ultrasound of the venous system to ensure that DVT or reflux is not present.  The patient will follow-up with me after the ultrasound.    -  VAS [place] LOWER EXTREMITY VENOUS REFLUX; Future  2. Lymphedema Recommend:  I have had a long discussion with the patient regarding swelling and why it  causes symptoms.  Patient will begin wearing graduated compression on a daily basis a prescription was given. The patient will  wear the stockings first thing in the morning and removing them in the evening. The patient is instructed specifically not to sleep in the stockings.   In addition, behavioral modification will be initiated.  This will include frequent elevation, use of over the counter pain medications and exercise such as walking.  Consideration for a lymph pump will also be made based upon the effectiveness of conservative therapy.  This would help to improve the edema control and prevent sequela such as ulcers and infections   Patient should undergo duplex ultrasound of the venous system to ensure that DVT or reflux is not present.  The patient will follow-up with me after the ultrasound.    3. Pain in both lower extremities Recommend:  The patient has atypical pain symptoms for vascular disease and on exam I do not find evidence of vascular pathology that would explain  the patient's symptoms.  Noninvasive studies do not identify significant vascular problems  I suspect the patient is c/o pseudoclaudication.  Patient should have an evaluation of the LS spine which I defer to the primary service or the Spine service.  The patient should continue walking and begin a more formal exercise program. The patient should continue his antiplatelet therapy and aggressive treatment of the lipid abnormalities.   4. Essential hypertension Continue antihypertensive medications as already ordered, these medications have been reviewed and there are no changes at this time.   5. Gastroesophageal reflux disease, unspecified whether esophagitis present Continue PPI as already ordered, this medication has been reviewed and there are no changes at this time.  Avoidence of caffeine and alcohol  Moderate elevation of the head of the bed      Hortencia Pilar, MD  08/08/2021 11:02 AM

## 2021-08-12 ENCOUNTER — Encounter: Payer: Self-pay | Admitting: Emergency Medicine

## 2021-08-12 ENCOUNTER — Ambulatory Visit
Admission: EM | Admit: 2021-08-12 | Discharge: 2021-08-12 | Disposition: A | Discharge status: Home / Self Care | Payer: 59 | Attending: Internal Medicine | Admitting: Internal Medicine

## 2021-08-12 DIAGNOSIS — H65192 Other acute nonsuppurative otitis media, left ear: Secondary | ICD-10-CM | POA: Diagnosis not present

## 2021-08-12 DIAGNOSIS — J069 Acute upper respiratory infection, unspecified: Secondary | ICD-10-CM | POA: Diagnosis not present

## 2021-08-12 MED ORDER — AMOXICILLIN 875 MG PO TABS: 875.0000 mg | ORAL_TABLET | Freq: Two times a day (BID) | ORAL | 0 refills | Status: AC

## 2021-08-12 MED ORDER — BENZONATATE 100 MG PO CAPS
100.0000 mg | ORAL_CAPSULE | Freq: Three times a day (TID) | ORAL | 0 refills | Status: DC | PRN
Start: 2021-08-12 — End: 2022-07-17

## 2021-08-12 NOTE — ED Provider Notes (Signed)
[facility]    [name]: [id number] Arrival date & time: 08/12/21  1203      History   Chief Complaint Chief Complaint  Patient presents with   Cough   Sore Throat   Otalgia    HPI Hannah Orr is a 46 y.o. female.   Patient presents with cough, sore throat, nasal congestion, bilateral ear pain that has been present for approximately 1.5 weeks.  Denies any known sick contacts or fever.  Denies chest pain, shortness of breath, nausea, vomiting, diarrhea, abdominal pain.  Patient denies history of asthma or COPD and is not a smoker.  Patient has taken over-the-counter cold and flu medications with minimal improvement of symptoms.   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   Abnormal CBC 06/21/2020   Paresthesia 06/21/2020   Dyspnea on exertion 06/15/2020   OSA (obstructive sleep apnea) 06/15/2020   Leukopenia 06/15/2020   Acute CHF (congestive heart failure) ([place]) 05/13/2020   Hypertensive urgency 05/13/2020   Macrocytosis 09/22/2019   Leg pain 06/17/2019   Fatty liver 09/25/2018   Edema 09/24/2018   History of abnormal cervical Pap smear 09/11/2018   GAD (generalized anxiety disorder) 07/31/2018   Female pelvic pain 07/31/2018   Morbid obesity ([place]) 07/31/2018   Cervical paraspinal muscle spasm 10/08/2015   Intramural leiomyoma of uterus 01/12/2014   Pelvic mass in female 12/28/2013   Nonspecific elevation of levels of transaminase or lactic  acid dehydrogenase (LDH) 11/24/2013   GERD (gastroesophageal reflux disease) 11/24/2013   Other abnormal glucose 08/08/2013   Morbid obesity with BMI of 50.0-59.9, adult ([place]) 08/08/2013   Flow murmur 10/21/2012   Healthcare maintenance 10/17/2012   Depression, acute 04/25/2011   Essential hypertension 12/26/2010    Past Surgical History:  Procedure Laterality Date   MANDIBLE SURGERY Right 12/11/1993    OB History   No obstetric history on file.      Home Medications    Prior to Admission medications   Medication Sig Start Date End Date Taking? Authorizing Provider  amoxicillin (AMOXIL) 875 MG tablet Take 1 tablet (875 mg total) by mouth 2 (two) times daily for 10 days. 08/12/21 08/22/21 Yes Tityana Pagan, Michele Rockers, FNP  benzonatate (TESSALON) 100 MG capsule Take 1 capsule (100 mg total) by mouth every 8 (eight) hours as needed for cough. 08/12/21  Yes Holliday Sheaffer, Hildred Alamin E, FNP  buPROPion (WELLBUTRIN XL) 150 MG 24 hr tablet Take 1 tablet (150 mg total) by mouth daily. 06/24/21   Libby Maw, MD  carvedilol (COREG) 25 MG tablet Take 1 tablet by mouth twice a day for blood pressure 05/02/21   Minus Breeding, MD  FLUoxetine (PROZAC) 40 MG capsule Take 1 capsule (40 mg total) by mouth daily. 06/24/21   Libby Maw, MD  gabapentin (NEURONTIN) 400 MG capsule Take 1 capsule (400 mg total) by mouth at bedtime. 06/24/21   Libby Maw, MD  levothyroxine (EUTHYROX) 112 MCG tablet Take 1 tablet (112 mcg total) by  mouth daily before breakfast. 06/24/21   Libby Maw, MD  spironolactone (ALDACTONE) 50 MG tablet Take 50 mg by mouth daily.    [provider]  torsemide (DEMADEX) 20 MG tablet Take 2 tablets (40 mg total) by mouth daily. 05/02/21 07/31/21  Minus Breeding, MD  vitamin B-12 (CYANOCOBALAMIN) 1000 MCG tablet Take 1 tablet (1,000 mcg total) by mouth daily. 06/24/21   Libby Maw, MD  Vitamin D, Ergocalciferol, (DRISDOL) 1.25 MG (50000 UNIT) CAPS  capsule Take 1 capsule (50,000 Units total) by mouth every 7 (seven) days. 06/24/21   Libby Maw, MD    Family History Family History  Problem Relation Age of Onset   Hypertension Mother    Breast cancer Mother    Hypertension Father    Prostate cancer Father    Cancer Neg Hx    Heart disease Neg Hx    Hyperlipidemia Neg Hx    Kidney disease Neg Hx    Diabetes Neg Hx    Early death Neg Hx    Hearing loss Neg Hx    Stroke Neg Hx     Social History Social History   Tobacco Use   Smoking status: Never   Smokeless tobacco: Never  Vaping Use   Vaping Use: Never used  Substance Use Topics   Alcohol use: Not Currently    Comment: Stopped drinking December 2021.    Drug use: No     Allergies   Benazepril hcl and Benazepril hcl   Review of Systems Review of Systems Per HPI  Physical Exam Triage Vital Signs ED Triage Vitals  Enc Vitals Group     BP 08/12/21 1238 (!) 146/88     Pulse Rate 08/12/21 1238 93     Resp 08/12/21 1238 20     Temp 08/12/21 1238 98.2 F (36.8 C)     Temp src --      SpO2 08/12/21 1238 95 %     Weight --      Height --      Head Circumference --      Peak Flow --      Pain Score 08/12/21 1236 8     Pain Loc --      Pain Edu? --      Excl. in [place]? --    No data found.  Updated Vital Signs BP (!) 146/88   Pulse 93   Temp 98.2 F (36.8 C)   Resp 20   SpO2 95%   Visual Acuity Right Eye Distance:   Left Eye Distance:   Bilateral Distance:    Right Eye Near:   Left Eye Near:    Bilateral Near:     Physical Exam Constitutional:      General: She is not in acute distress.    Appearance: Normal appearance. She is not toxic-appearing or diaphoretic.  HENT:     Head: Normocephalic and atraumatic.     Right Ear: Tympanic membrane and ear canal normal. No drainage, swelling or tenderness. No middle ear effusion. There is no impacted cerumen. No foreign body. No mastoid tenderness.     Left Ear: Ear canal normal. No  drainage, swelling or tenderness.  No middle ear effusion. There is no impacted cerumen. No foreign body. No mastoid tenderness. Tympanic membrane is erythematous. Tympanic membrane is not perforated or bulging.     Nose: Congestion present.     Mouth/Throat:     Mouth: Mucous membranes are moist.  Pharynx: No posterior oropharyngeal erythema.  Eyes:     Extraocular Movements: Extraocular movements intact.     Conjunctiva/sclera: Conjunctivae normal.     Pupils: Pupils are equal, round, and reactive to light.  Cardiovascular:     Rate and Rhythm: Normal rate and regular rhythm.     Pulses: Normal pulses.     Heart sounds: Normal heart sounds.  Pulmonary:     Effort: Pulmonary effort is normal. No respiratory distress.     Breath sounds: Normal breath sounds. No stridor. No wheezing, rhonchi or rales.  Abdominal:     General: Abdomen is flat. Bowel sounds are normal.     Palpations: Abdomen is soft.  Musculoskeletal:        General: Normal range of motion.     Cervical back: Normal range of motion.  Skin:    General: Skin is warm and dry.  Neurological:     General: No focal deficit present.     Mental Status: She is alert and oriented to person, place, and time. Mental status is at baseline.  Psychiatric:        Mood and Affect: Mood normal.        Behavior: Behavior normal.      UC Treatments / Results  Labs (all labs ordered are listed, but only abnormal results are displayed) Labs Reviewed - No data to display  EKG   Radiology No results found.  Procedures Procedures (including critical care time)  Medications Ordered in UC Medications - No data to display  Initial Impression / Assessment and Plan / UC Course  I have reviewed the triage vital signs and the nursing notes.  Pertinent labs & imaging results that were available during my care of the patient were reviewed by me and considered in my medical decision making (see chart for details).     Patient  has persistent upper respiratory infection with symptoms over 10 days as well as left ear infection which warrants antibiotic therapy.  Will treat with amoxicillin antibiotic.  Do not think that chest imaging is necessary given no adventitious lung sounds on exam.  Do not think that viral testing is necessary given duration of symptoms and it would not change treatment.  Discussed supportive care and symptom management with patient.  Discussed return and ER precautions.  Patient verbalized understanding and was agreeable with plan. Final Clinical Impressions(s) / UC Diagnoses   Final diagnoses:  Other non-recurrent acute nonsuppurative otitis media of left ear  Acute upper respiratory infection     Discharge Instructions      You have an ear infection and upper respiratory infection which is being treated with an antibiotic.  You have also been sent a cough medication to take as needed.  Please follow-up if symptoms persist or worsen.    ED Prescriptions     Medication Sig Dispense Auth. Provider   amoxicillin (AMOXIL) 875 MG tablet Take 1 tablet (875 mg total) by mouth 2 (two) times daily for 10 days. 20 tablet [place], [place] E, [place]   benzonatate (TESSALON) 100 MG capsule Take 1 capsule (100 mg total) by mouth every 8 (eight) hours as needed for cough. 21 capsule [place], Michele Rockers, [place]      PDMP not reviewed this encounter.   Teodora Medici, [place] 08/12/21 1256

## 2021-08-12 NOTE — Discharge Instructions (Signed)
You have an ear infection and upper respiratory infection which is being treated with an antibiotic.  You have also been sent a cough medication to take as needed.  Please follow-up if symptoms persist or worsen.

## 2021-08-12 NOTE — ED Triage Notes (Signed)
Pt is present today with cough, Sore throat, and bilateral ear pain. Pt sx started last week

## 2021-08-24 ENCOUNTER — Ambulatory Visit (INDEPENDENT_AMBULATORY_CARE_PROVIDER_SITE_OTHER): Payer: 59 | Admitting: Family Medicine

## 2021-08-24 ENCOUNTER — Ambulatory Visit (INDEPENDENT_AMBULATORY_CARE_PROVIDER_SITE_OTHER): Payer: 59

## 2021-08-24 VITALS — BP 154/90 | HR 75 | Temp 95.9°F | Ht 65.0 in | Wt 353.4 lb

## 2021-08-24 DIAGNOSIS — J22 Unspecified acute lower respiratory infection: Secondary | ICD-10-CM | POA: Diagnosis not present

## 2021-08-24 DIAGNOSIS — J4521 Mild intermittent asthma with (acute) exacerbation: Secondary | ICD-10-CM

## 2021-08-24 DIAGNOSIS — J45909 Unspecified asthma, uncomplicated: Secondary | ICD-10-CM | POA: Insufficient documentation

## 2021-08-24 MED ORDER — DOXYCYCLINE HYCLATE 100 MG PO TABS: 100.0000 mg | ORAL_TABLET | Freq: Two times a day (BID) | ORAL | 0 refills | Status: AC

## 2021-08-24 MED ORDER — PREDNISONE 10 MG PO TABS: 10.0000 mg | ORAL_TABLET | Freq: Two times a day (BID) | ORAL | 0 refills | Status: AC

## 2021-08-24 MED ORDER — ALBUTEROL SULFATE HFA 108 (90 BASE) MCG/ACT IN AERS: 1.0000 | INHALATION_SPRAY | Freq: Four times a day (QID) | RESPIRATORY_TRACT | 2 refills | Status: AC | PRN

## 2021-08-24 NOTE — Progress Notes (Signed)
Established Patient Office Visit  Subjective   Patient ID: Hannah Orr, female    DOB: Sep 10, 1975  Age: 46 y.o. MRN: [id number]  Chief Complaint  Patient presents with   Cough    Cough X2 weeks, pain in right ribs, blood in mucus, sore throat, congestion.     Cough Associated symptoms include hemoptysis and wheezing. Pertinent negatives include no chest pain, chills, myalgias or weight loss.   for follow-up of a 2 to 3-week history of URI signs and symptoms that now seem to have settled down into the chest area.  Cough persist productive clear phlegm that is occasionally yellow and with streaks of blood.  There is some tightness in the chest and wheezing.  Distant history of wheezing with illness.  No asthma or COPD history.  She does not smoke.  There have been no fevers or chills.  Was seen in urgent care and treated with amoxicillin and Tessalon.    Review of Systems  Constitutional:  Negative for chills, diaphoresis, malaise/fatigue and weight loss.  HENT: Negative.    Eyes: Negative.  Negative for blurred vision and double vision.  Respiratory:  Positive for cough, hemoptysis, sputum production and wheezing.   Cardiovascular:  Negative for chest pain.  Gastrointestinal:  Negative for abdominal pain.  Genitourinary: Negative.   Musculoskeletal:  Negative for falls and myalgias.  Neurological:  Negative for speech change, loss of consciousness and weakness.  Psychiatric/Behavioral: Negative.        Objective:     BP (!) 154/90 (BP Location: Right Arm, Patient Position: Sitting, Cuff Size: Large)   Pulse 75   Temp (!) 95.9 F (35.5 C) (Temporal)   Ht '5\' 5"'$  (1.651 m)   Wt (!) 353 lb 6.4 oz (160.3 kg)   SpO2 96%   BMI 58.81 kg/m    Physical Exam Constitutional:      General: She is not in acute distress.    Appearance: Normal appearance. She is not ill-appearing, toxic-appearing or diaphoretic.  HENT:     Head: Normocephalic and atraumatic.     Right Ear:  External ear normal.     Left Ear: External ear normal.  Eyes:     General: No scleral icterus.       Right eye: No discharge.        Left eye: No discharge.     Extraocular Movements: Extraocular movements intact.     Conjunctiva/sclera: Conjunctivae normal.  Cardiovascular:     Rate and Rhythm: Normal rate and regular rhythm.  Pulmonary:     Effort: Pulmonary effort is normal. No respiratory distress.     Breath sounds: Normal breath sounds. No wheezing or rales.  Abdominal:     General: Bowel sounds are normal.     Tenderness: There is no abdominal tenderness. There is no guarding.  Musculoskeletal:     Cervical back: No rigidity or tenderness.  Skin:    General: Skin is warm and dry.  Neurological:     Mental Status: She is alert and oriented to person, place, and time.  Psychiatric:        Mood and Affect: Mood normal.        Behavior: Behavior normal.      No results found for any visits on 08/24/21.    The 10-year ASCVD risk score (Arnett DK, et al., 2019) is: 3.6%    Assessment & Plan:   Problem List Items Addressed This Visit       Respiratory  Lower respiratory tract infection - Primary   Relevant Medications   doxycycline (VIBRA-TABS) 100 MG tablet   predniSONE (DELTASONE) 10 MG tablet   albuterol (VENTOLIN HFA) 108 (90 Base) MCG/ACT inhaler   Other Relevant Orders   DG Chest 2 View   Reactive airway disease   Relevant Medications   predniSONE (DELTASONE) 10 MG tablet   albuterol (VENTOLIN HFA) 108 (90 Base) MCG/ACT inhaler    Return in about 1 week (around 08/31/2021), or if symptoms worsen or fail to improve.    Libby Maw, MD

## 2021-08-24 NOTE — Progress Notes (Unsigned)
Follow-up of a 2 to 3-week history of URI signs and symptoms that now seem to have settled down into the chest area.  Cough persist productive clear phlegm that is occasionally yellow and with streaks of blood.  There is some tightness in the chest and wheezing.  Distant history of wheezing with illness.  No asthma or COPD history.  She does not smoke.

## 2021-08-26 ENCOUNTER — Encounter: Payer: Self-pay | Admitting: Family Medicine

## 2021-09-01 NOTE — Progress Notes (Signed)
Cardiology Office Note   Date:  09/02/2021   ID:  Hannah Orr, DOB [birth year], MRN [id number]  PCP:  Libby Maw, MD  Cardiologist:   Minus Breeding, MD Referring:  Libby Maw, MD  Chief Complaint  Patient presents with   Edema       History of Present Illness: Hannah Orr is a 46 y.o. female who is referred by Libby Maw, MD for evaluation of edema and difficult to control HTN.   She was in the hospital in March 2022 for this.  She had IV diuresis.  I did review these records for this appointment.  An echocardiogram was basically unremarkable.  She was taken off of amlodipine because of the swelling and started on carvedilol.  She was to have a sleep study home test but she canceled this.  She has been followed in the Pharm D HTN clinic.     Past Medical History:  Diagnosis Date   Depression    Hypertension     Past Surgical History:  Procedure Laterality Date   MANDIBLE SURGERY Right 12/11/1993     Current Outpatient Medications  Medication Sig Dispense Refill   albuterol (VENTOLIN HFA) 108 (90 Base) MCG/ACT inhaler Inhale 1-2 puffs into the lungs every 6 (six) hours as needed for wheezing or shortness of breath. Please instruct 8 g 2   buPROPion (WELLBUTRIN XL) 150 MG 24 hr tablet Take 1 tablet (150 mg total) by mouth daily. 30 tablet 2   carvedilol (COREG) 25 MG tablet Take 1 tablet by mouth twice a day for blood pressure 180 tablet 1   doxycycline (VIBRA-TABS) 100 MG tablet Take 1 tablet (100 mg total) by mouth 2 (two) times daily for 10 days. 20 tablet 0   FLUoxetine (PROZAC) 40 MG capsule Take 1 capsule (40 mg total) by mouth daily. 90 capsule 1   levothyroxine (EUTHYROX) 112 MCG tablet Take 1 tablet (112 mcg total) by mouth daily before breakfast. 90 tablet 1   spironolactone (ALDACTONE) 50 MG tablet Take 50 mg by mouth daily.     torsemide (DEMADEX) 20 MG tablet Take 2 tablets (40 mg total) by mouth daily. 180 tablet  1   vitamin B-12 (CYANOCOBALAMIN) 1000 MCG tablet Take 1 tablet (1,000 mcg total) by mouth daily. 90 tablet 1   Vitamin D, Ergocalciferol, (DRISDOL) 1.25 MG (50000 UNIT) CAPS capsule Take 1 capsule (50,000 Units total) by mouth every 7 (seven) days. 15 capsule 1   benzonatate (TESSALON) 100 MG capsule Take 1 capsule (100 mg total) by mouth every 8 (eight) hours as needed for cough. (Patient not taking: Reported on 09/02/2021) 21 capsule 0   gabapentin (NEURONTIN) 400 MG capsule Take 1 capsule (400 mg total) by mouth at bedtime. (Patient not taking: Reported on 09/02/2021) 90 capsule 1   No current facility-administered medications for this visit.    Allergies:   Benazepril hcl and Benazepril hcl    ROS:  Please see the history of present illness.   Otherwise, review of systems are positive for none.   All other systems are reviewed and negative.    PHYSICAL EXAM: VS:  BP 140/72   Pulse 84   Ht '5\' 5"'$  (1.651 m)   Wt (!) 350 lb 3.2 oz (158.8 kg)   SpO2 97%   BMI 58.28 kg/m  , BMI Body mass index is 58.28 kg/m. GEN:  No distress NECK:  No jugular venous distention at 90 degrees, waveform within normal  limits, carotid upstroke brisk and symmetric, no bruits, no thyromegaly LYMPHATICS:  No cervical adenopathy LUNGS:  Clear to auscultation bilaterally BACK:  No CVA tenderness CHEST:  Unremarkable HEART:  S1 and S2 within normal limits, no S3, no S4, no clicks, no rubs, 2 out of 6 apical systolic murmur radiating at aortic outflow tract, murmurs ABD:  Positive bowel sounds normal in frequency in pitch, no bruits, no rebound, no guarding, unable to assess midline mass or bruit with the patient seated. EXT:  2 plus pulses throughout, moderate edema, no cyanosis no clubbing SKIN:  No rashes no nodules NEURO:  Cranial nerves II through XII grossly intact, motor grossly intact throughout PSYCH:  Cognitively intact, oriented to person place and time   EKG:  EKG is not ordered  today. NA   Recent Labs: 11/08/2020: ALT 31; Hemoglobin 12.2; Platelets 347.0 05/09/2021: BUN 10; Creatinine, Ser 0.79; Potassium 3.9; Sodium 133; TSH 4.05    Lipid Panel    Component Value Date/Time   CHOL 228 (H) 11/08/2020 1020   TRIG 59.0 11/08/2020 1020   HDL 78.00 11/08/2020 1020   CHOLHDL 3 11/08/2020 1020   VLDL 11.8 11/08/2020 1020   LDLCALC 138 (H) 11/08/2020 1020   LDLDIRECT 132.0 11/08/2020 1020      Wt Readings from Last 3 Encounters:  09/02/21 (!) 350 lb 3.2 oz (158.8 kg)  08/24/21 (!) 353 lb 6.4 oz (160.3 kg)  08/08/21 (!) 348 lb (157.9 kg)      Other studies Reviewed: Additional studies/ records that were reviewed today include: Cough  Review of the above records demonstrates:  Please see elsewhere in the note.     ASSESSMENT AND PLAN:  EDEMA:     This seems to be improved.  She is doing relatively well on the regimen as listed.  No change in therapy.   HTN:    The BP is at target.  She will continue the meds as listed.   MORBID OBESITY:   We have made referrals to [facility].  Unfortunately I cannot determine what happened to those referrals.    SLEEP APNEA: The patient most likely has sleep apnea.  STOP-BANG is 6.  She will have a home sleep study.  This previously was ordered but it said the patient refused but she does not know anything about this.   Current medicines are reviewed at length with the patient today.  The patient does not have concerns regarding medicines.  The following changes have been made:  None  Labs/ tests ordered today include:   Orders Placed This Encounter  Procedures   [place] Sleep Study     Disposition:   FU with Jory Sims DNP in 3 months.    Signed, Minus Breeding, MD  09/02/2021 12:55 PM    [facility]

## 2021-09-02 ENCOUNTER — Ambulatory Visit (INDEPENDENT_AMBULATORY_CARE_PROVIDER_SITE_OTHER): Payer: 59 | Admitting: Cardiology

## 2021-09-02 ENCOUNTER — Encounter: Payer: Self-pay | Admitting: Cardiology

## 2021-09-02 VITALS — BP 140/72 | HR 84 | Ht 65.0 in | Wt 350.2 lb

## 2021-09-02 DIAGNOSIS — I10 Essential (primary) hypertension: Secondary | ICD-10-CM | POA: Diagnosis not present

## 2021-09-02 DIAGNOSIS — R0683 Snoring: Secondary | ICD-10-CM | POA: Diagnosis not present

## 2021-09-02 DIAGNOSIS — M7989 Other specified soft tissue disorders: Secondary | ICD-10-CM

## 2021-09-02 NOTE — Patient Instructions (Signed)
  Testing/Procedures:  [place] sleep study    Follow-Up: At [facility], you and your health needs are our priority.  As part of our continuing mission to provide you with exceptional heart care, we have created designated Provider Care Teams.  These Care Teams include your primary Cardiologist (physician) and Advanced Practice Providers (APPs -  Physician Assistants and Nurse Practitioners) who all work together to provide you with the care you need, when you need it.  We recommend signing up for the patient portal called "MyChart".  Sign up information is provided on this After Visit Summary.  MyChart is used to connect with patients for Virtual Visits (Telemedicine).  Patients are able to view lab/test results, encounter notes, upcoming appointments, etc.  Non-urgent messages can be sent to your provider as well.   To learn more about what you can do with MyChart, go to [email or web address].    Your next appointment:   6 month(s)  The format for your next appointment:   In Person  Provider:   Jory Sims, DNP, ANP          Important Information About Sugar

## 2021-09-22 ENCOUNTER — Encounter (INDEPENDENT_AMBULATORY_CARE_PROVIDER_SITE_OTHER): Payer: Self-pay | Admitting: *Deleted

## 2021-09-26 ENCOUNTER — Ambulatory Visit (INDEPENDENT_AMBULATORY_CARE_PROVIDER_SITE_OTHER): Payer: 59 | Admitting: Family Medicine

## 2021-09-26 ENCOUNTER — Encounter: Payer: Self-pay | Admitting: Family Medicine

## 2021-09-26 VITALS — BP 124/68 | HR 78 | Temp 95.7°F | Ht 65.0 in | Wt 354.0 lb

## 2021-09-26 DIAGNOSIS — E559 Vitamin D deficiency, unspecified: Secondary | ICD-10-CM | POA: Diagnosis not present

## 2021-09-26 DIAGNOSIS — Z6841 Body Mass Index (BMI) 40.0 and over, adult: Secondary | ICD-10-CM

## 2021-09-26 DIAGNOSIS — E538 Deficiency of other specified B group vitamins: Secondary | ICD-10-CM | POA: Diagnosis not present

## 2021-09-26 DIAGNOSIS — R0982 Postnasal drip: Secondary | ICD-10-CM | POA: Insufficient documentation

## 2021-09-26 DIAGNOSIS — H6983 Other specified disorders of Eustachian tube, bilateral: Secondary | ICD-10-CM | POA: Diagnosis not present

## 2021-09-26 LAB — VITAMIN D 25 HYDROXY (VIT D DEFICIENCY, FRACTURES): VITD: 25.91 ng/mL — ABNORMAL LOW (ref 30.00–100.00)

## 2021-09-26 LAB — VITAMIN B12: Vitamin B-12: 239 pg/mL (ref 211–911)

## 2021-09-26 MED ORDER — PREDNISONE 10 MG PO TABS: 10.0000 mg | ORAL_TABLET | Freq: Two times a day (BID) | ORAL | 0 refills | Status: AC

## 2021-09-26 MED ORDER — MOMETASONE FUROATE 50 MCG/ACT NA SUSP: 2.0000 | Freq: Every day | NASAL | 12 refills | Status: DC

## 2021-09-26 NOTE — Progress Notes (Signed)
Established Patient Office Visit  Subjective   Patient ID: Hannah Orr, female    DOB: [birth year]  Age: 46 y.o. MRN: [id number]  Chief Complaint  Patient presents with   Follow-up    3 mo f/u. Darkening of ankle/ shin area of both legs. R ear pain, cough, phlegm, congestion X 1 mo.    HPI follow-up of ongoing postnasal drip with bilateral ear congestion and soreness.  Recent prednisone therapy seem to help a little bit.  Denies facial pressure or teeth pain.  Continues fluoxetine and bupropion for depression.  Continues high-dose weekly D and 1000 mcg of B12 daily.    Review of Systems  Constitutional:  Negative for chills, diaphoresis, malaise/fatigue and weight loss.  HENT:  Positive for ear pain. Negative for congestion and ear discharge.   Eyes: Negative.  Negative for blurred vision and double vision.  Respiratory: Negative.    Cardiovascular:  Negative for chest pain.  Gastrointestinal:  Negative for abdominal pain.  Genitourinary: Negative.   Musculoskeletal:  Negative for falls and myalgias.  Neurological:  Negative for speech change, loss of consciousness and weakness.  Psychiatric/Behavioral: Negative.        Objective:     BP 124/68 (BP Location: Right Arm, Patient Position: Sitting, Cuff Size: Large)   Pulse 78   Temp (!) 95.7 F (35.4 C) (Temporal)   Ht '5\' 5"'$  (1.651 m)   Wt (!) 354 lb (160.6 kg)   SpO2 97%   BMI 58.91 kg/m  Wt Readings from Last 3 Encounters:  09/26/21 (!) 354 lb (160.6 kg)  09/02/21 (!) 350 lb 3.2 oz (158.8 kg)  08/24/21 (!) 353 lb 6.4 oz (160.3 kg)      Physical Exam Constitutional:      General: She is not in acute distress.    Appearance: Normal appearance. She is obese. She is not ill-appearing, toxic-appearing or diaphoretic.  HENT:     Head: Normocephalic and atraumatic.     Right Ear: External ear normal. No middle ear effusion. Tympanic membrane is retracted. Tympanic membrane is not injected or erythematous.      Left Ear: External ear normal.  No middle ear effusion. Tympanic membrane is retracted. Tympanic membrane is not injected or erythematous.     Mouth/Throat:     Mouth: Mucous membranes are moist.     Pharynx: Oropharynx is clear. No oropharyngeal exudate or posterior oropharyngeal erythema.  Eyes:     General: No scleral icterus.       Right eye: No discharge.        Left eye: No discharge.     Extraocular Movements: Extraocular movements intact.     Conjunctiva/sclera: Conjunctivae normal.     Pupils: Pupils are equal, round, and reactive to light.  Cardiovascular:     Rate and Rhythm: Normal rate and regular rhythm.  Pulmonary:     Effort: Pulmonary effort is normal. No respiratory distress.     Breath sounds: Normal breath sounds.  Abdominal:     General: Bowel sounds are normal.     Tenderness: There is no abdominal tenderness. There is no guarding.  Musculoskeletal:     Cervical back: No rigidity or tenderness.  Skin:    General: Skin is warm and dry.  Neurological:     Mental Status: She is alert and oriented to person, place, and time.  Psychiatric:        Mood and Affect: Mood normal.        Behavior:  Behavior normal.      No results found for any visits on 09/26/21.    The 10-year ASCVD risk score (Arnett DK, et al., 2019) is: 1.3%    Assessment & Plan:   Problem List Items Addressed This Visit       Nervous and Auditory   Dysfunction of both eustachian tubes   Relevant Medications   predniSONE (DELTASONE) 10 MG tablet   mometasone (NASONEX) 50 MCG/ACT nasal spray     Other   Morbid obesity with BMI of 50.0-59.9, adult (HCC)   Relevant Orders   Amb Referral to Bariatric Surgery   Vitamin D deficiency   Relevant Orders   VITAMIN D 25 Hydroxy (Vit-D Deficiency, Fractures)   B12 deficiency - Primary   Relevant Orders   Vitamin B12   Post-nasal drip   Relevant Medications   predniSONE (DELTASONE) 10 MG tablet   mometasone (NASONEX) 50 MCG/ACT nasal  spray    Return in about 3 months (around 12/27/2021).  Brief course of low-dose prednisone with concomitant use of Nasonex.  Advised that it could take Nasonex up to 2 weeks to become effective.  We will repeat refer to bariatric surgery.  Patient has a new Buyer, retail.  Rechecking vitamin D and B12 levels.  Continue fluoxetine and bupropion  Libby Maw, MD

## 2021-09-28 ENCOUNTER — Telehealth: Payer: Self-pay | Admitting: *Deleted

## 2021-09-28 ENCOUNTER — Telehealth (INDEPENDENT_AMBULATORY_CARE_PROVIDER_SITE_OTHER): Payer: Self-pay

## 2021-09-28 ENCOUNTER — Other Ambulatory Visit: Payer: Self-pay | Admitting: Cardiology

## 2021-09-28 ENCOUNTER — Other Ambulatory Visit: Payer: Self-pay

## 2021-09-28 ENCOUNTER — Telehealth: Payer: Self-pay

## 2021-09-28 DIAGNOSIS — E559 Vitamin D deficiency, unspecified: Secondary | ICD-10-CM

## 2021-09-28 DIAGNOSIS — G629 Polyneuropathy, unspecified: Secondary | ICD-10-CM

## 2021-09-28 DIAGNOSIS — E538 Deficiency of other specified B group vitamins: Secondary | ICD-10-CM

## 2021-09-28 MED ORDER — VITAMIN B-12 1000 MCG PO TABS: 1000.0000 ug | ORAL_TABLET | Freq: Every day | ORAL | 1 refills | Status: DC

## 2021-09-28 MED ORDER — VITAMIN D (ERGOCALCIFEROL) 1.25 MG (50000 UNIT) PO CAPS: 50000.0000 [IU] | ORAL_CAPSULE | ORAL | 1 refills | Status: DC

## 2021-09-28 NOTE — Telephone Encounter (Signed)
Pt pharmacy requesting refill on spironolactone. Medication was started in the hospital. Pt is followed by Dr Percival Spanish. Please address. Thank you

## 2021-09-28 NOTE — Telephone Encounter (Signed)
Prior Authorization for [employer] sent to Friday insurance via Phone. Per Lesle Chris no PA is required for code 95800. Debria Garret notified ok to acitvate.

## 2021-09-28 NOTE — Telephone Encounter (Signed)
Called and made the patient aware that She may proceed with the [facility] Sleep Study. PIN # provided to the patient. Patient made aware that She will be contacted after the test has been read with the results and any recommendations. Patient verbalized understanding and thanked me for the call.

## 2021-09-29 NOTE — Telephone Encounter (Signed)
We can bring her in for abis and bilateral venous reflux ... See gs/fb

## 2021-10-10 ENCOUNTER — Telehealth: Payer: Self-pay | Admitting: Family Medicine

## 2021-10-10 NOTE — Telephone Encounter (Signed)
Caller Name: Howard  Call back phone #: [phone]  Reason for Call: Reference #[id number], called to get an update on Dr. Bebe Shaggy progress with the form to complete pt's long term disability.

## 2021-10-21 NOTE — Telephone Encounter (Signed)
Forms just received 10/20/21 pending on Providers desk to be viewed and signed.

## 2021-10-24 NOTE — Telephone Encounter (Signed)
Called for an update on forms requested. Phone number and fax were confirmed as correct in previous message

## 2021-10-24 NOTE — Telephone Encounter (Signed)
Na

## 2021-10-27 ENCOUNTER — Other Ambulatory Visit (INDEPENDENT_AMBULATORY_CARE_PROVIDER_SITE_OTHER): Payer: Self-pay | Admitting: Nurse Practitioner

## 2021-10-27 DIAGNOSIS — M7989 Other specified soft tissue disorders: Secondary | ICD-10-CM

## 2021-10-27 DIAGNOSIS — L819 Disorder of pigmentation, unspecified: Secondary | ICD-10-CM

## 2021-10-27 DIAGNOSIS — R200 Anesthesia of skin: Secondary | ICD-10-CM

## 2021-10-28 ENCOUNTER — Encounter (INDEPENDENT_AMBULATORY_CARE_PROVIDER_SITE_OTHER): Payer: 59

## 2021-10-28 ENCOUNTER — Ambulatory Visit (INDEPENDENT_AMBULATORY_CARE_PROVIDER_SITE_OTHER): Payer: 59 | Admitting: Nurse Practitioner

## 2021-11-08 ENCOUNTER — Encounter (INDEPENDENT_AMBULATORY_CARE_PROVIDER_SITE_OTHER): Payer: 59

## 2021-11-14 ENCOUNTER — Ambulatory Visit (INDEPENDENT_AMBULATORY_CARE_PROVIDER_SITE_OTHER): Payer: 59 | Admitting: Nurse Practitioner

## 2021-11-29 ENCOUNTER — Telehealth: Payer: Self-pay | Admitting: Cardiology

## 2021-11-29 ENCOUNTER — Telehealth: Payer: Self-pay

## 2021-11-29 NOTE — Telephone Encounter (Signed)
Spoke to patient stated she does not know how to work the sleep watch.Advised I will send message to sleep coordinator.

## 2021-11-29 NOTE — Telephone Encounter (Signed)
Pt returning call regarding sleep watch. Please advise

## 2021-11-29 NOTE — Telephone Encounter (Signed)
I called the patient to speak with her about the Itamar sleep watch. I LVM for patient to return my call.

## 2021-12-01 ENCOUNTER — Ambulatory Visit (INDEPENDENT_AMBULATORY_CARE_PROVIDER_SITE_OTHER): Payer: 59

## 2021-12-01 ENCOUNTER — Ambulatory Visit (INDEPENDENT_AMBULATORY_CARE_PROVIDER_SITE_OTHER): Payer: 59 | Admitting: Nurse Practitioner

## 2021-12-01 ENCOUNTER — Encounter (INDEPENDENT_AMBULATORY_CARE_PROVIDER_SITE_OTHER): Payer: Self-pay | Admitting: Nurse Practitioner

## 2021-12-01 VITALS — BP 152/85 | HR 88 | Resp 20 | Ht 65.0 in | Wt 357.0 lb

## 2021-12-01 DIAGNOSIS — I509 Heart failure, unspecified: Secondary | ICD-10-CM

## 2021-12-01 DIAGNOSIS — G629 Polyneuropathy, unspecified: Secondary | ICD-10-CM | POA: Diagnosis not present

## 2021-12-01 DIAGNOSIS — M7989 Other specified soft tissue disorders: Secondary | ICD-10-CM

## 2021-12-01 DIAGNOSIS — I890 Lymphedema, not elsewhere classified: Secondary | ICD-10-CM

## 2021-12-01 DIAGNOSIS — L819 Disorder of pigmentation, unspecified: Secondary | ICD-10-CM

## 2021-12-01 DIAGNOSIS — I10 Essential (primary) hypertension: Secondary | ICD-10-CM

## 2021-12-01 DIAGNOSIS — Z6841 Body Mass Index (BMI) 40.0 and over, adult: Secondary | ICD-10-CM

## 2021-12-01 DIAGNOSIS — M79604 Pain in right leg: Secondary | ICD-10-CM

## 2021-12-01 DIAGNOSIS — M79605 Pain in left leg: Secondary | ICD-10-CM

## 2021-12-01 DIAGNOSIS — R200 Anesthesia of skin: Secondary | ICD-10-CM

## 2021-12-04 ENCOUNTER — Encounter (INDEPENDENT_AMBULATORY_CARE_PROVIDER_SITE_OTHER): Payer: Self-pay | Admitting: Nurse Practitioner

## 2021-12-04 NOTE — Progress Notes (Signed)
Subjective:    Patient ID: Hannah Orr, female    DOB: 12/28/1975, 46 y.o.   MRN: [id number] No chief complaint on file.   The patient returns to the office for followup evaluation regarding leg swelling.  The swelling has persisted and the pain associated with swelling continues. There have not been any interval development of a ulcerations or wounds.  Since the previous visit the patient has been wearing graduated compression stockings and has noted little if any improvement in the lymphedema.   The patient also states elevation during the day and exercise is being done too.   Today noninvasive studies show she has noncompressible ABIs bilaterally but with triphasic tibial artery waveforms bilaterally with normal toe waveforms bilaterally.  The patient has no evidence of DVT or superficial thrombophlebitis bilaterally.  Left has evidence of deep venous insufficiency.  No superficial venous reflux noted bilaterally      Review of Systems  Cardiovascular:  Positive for leg swelling.  Musculoskeletal:  Positive for back pain and gait problem.  All other systems reviewed and are negative.      Objective:   Physical Exam Vitals reviewed.  HENT:     Head: Normocephalic.  Cardiovascular:     Rate and Rhythm: Normal rate.     Pulses: Normal pulses.  Pulmonary:     Effort: Pulmonary effort is normal.  Musculoskeletal:        General: Tenderness present.     Right lower leg: Edema present.     Left lower leg: Edema present.  Skin:    General: Skin is warm and dry.     Comments: Dermal thickening bilaterally with stasis dermatitis  Neurological:     Mental Status: She is alert and oriented to person, place, and time.     Gait: Gait abnormal.  Psychiatric:        Mood and Affect: Mood normal.        Behavior: Behavior normal.        Thought Content: Thought content normal.        Judgment: Judgment normal.     BP (!) 152/85 (BP Location: Right Arm)   Pulse 88    Resp 20   Ht 5' 5"  (1.651 m)   Wt (!) 357 lb (161.9 kg)   BMI 59.41 kg/m   Past Medical History:  Diagnosis Date   Depression    Hypertension     Social History   Socioeconomic History   Marital status: Single    Spouse name: Not on file   Number of children: Not on file   Years of education: Not on file   Highest education level: Not on file  Occupational History   Not on file  Tobacco Use   Smoking status: Never   Smokeless tobacco: Never  Vaping Use   Vaping Use: Never used  Substance and Sexual Activity   Alcohol use: Not Currently    Comment: Stopped drinking December 2021.    Drug use: No   Sexual activity: Yes    Birth control/protection: Condom  Other Topics Concern   Not on file  Social History Narrative   Lives at home with mom.   Social Determinants of Health   Financial Resource Strain: Not on file  Food Insecurity: Not on file  Transportation Needs: Not on file  Physical Activity: Not on file  Stress: Not on file  Social Connections: Not on file  Intimate Partner Violence: Not on file  Past Surgical History:  Procedure Laterality Date   MANDIBLE SURGERY Right 12/11/1993    Family History  Problem Relation Age of Onset   Hypertension Mother    Breast cancer Mother    Hypertension Father    Prostate cancer Father    Cancer Neg Hx    Heart disease Neg Hx    Hyperlipidemia Neg Hx    Kidney disease Neg Hx    Diabetes Neg Hx    Early death Neg Hx    Hearing loss Neg Hx    Stroke Neg Hx     Allergies  Allergen Reactions   Benazepril Hcl Hypertension    cough   Benazepril Hcl     cough       Latest Ref Rng & Units 11/08/2020   10:20 AM 07/28/2020   12:05 PM 06/25/2020   11:36 AM  CBC  WBC 4.0 - 10.5 K/uL 2.6  1.9  2.2   Hemoglobin 12.0 - 15.0 g/dL 12.2  12.0  12.4   Hematocrit 36.0 - 46.0 % 37.9  35.8  38.4   Platelets 150.0 - 400.0 K/uL 347.0  208  243       CMP     Component Value Date/Time   NA 133 (L) 05/09/2021  1152   NA 134 03/25/2021 1412   K 3.9 05/09/2021 1152   CL 96 05/09/2021 1152   CO2 22 05/09/2021 1152   GLUCOSE 95 05/09/2021 1152   BUN 10 05/09/2021 1152   BUN 9 03/25/2021 1412   CREATININE 0.79 05/09/2021 1152   CREATININE 0.75 07/28/2020 1205   CALCIUM 9.5 05/09/2021 1152   PROT 8.1 11/08/2020 1020   ALBUMIN 4.1 11/08/2020 1020   AST 53 (H) 11/08/2020 1020   AST 295 (HH) 07/28/2020 1205   ALT 31 11/08/2020 1020   ALT 120 (H) 07/28/2020 1205   ALKPHOS 65 11/08/2020 1020   BILITOT 0.9 11/08/2020 1020   BILITOT 1.0 07/28/2020 1205   GFRNONAA >60 07/28/2020 1205     VAS [place] ABI WITH/WO TBI  Result Date: 12/01/2021  LOWER EXTREMITY DOPPLER STUDY Patient Name:  Hannah Orr  Date of Exam:   12/01/2021 Medical Rec #: [id number]          Accession #:    [id number] Date of Birth: 07/13/1975          Patient Gender: F Patient Age:   15 years Exam Location:  [facility] Procedure:      VAS [place] ABI WITH/WO TBI Referring Phys: --------------------------------------------------------------------------------  Indications: toe numbness  Limitations: Today's exam was limited due to morbid obesity. Performing Technologist: Concha Norway RVT  Examination Guidelines: A complete evaluation includes at minimum, Doppler waveform signals and systolic blood pressure reading at the level of bilateral brachial, anterior tibial, and posterior tibial arteries, when vessel segments are accessible. Bilateral testing is considered an integral part of a complete examination. Photoelectric Plethysmograph (PPG) waveforms and toe systolic pressure readings are included as required and additional duplex testing as needed. Limited examinations for reoccurring indications may be performed as noted.  ABI Findings: +---------+------------------+-----+---------+--------+ Right    Rt Pressure (mmHg)IndexWaveform Comment  +---------+------------------+-----+---------+--------+ Brachial 173                                       +---------+------------------+-----+---------+--------+ ATA  triphasicNonComp  +---------+------------------+-----+---------+--------+ PTA                             triphasicNonComp  +---------+------------------+-----+---------+--------+ Great Toe                       Normal   NonComp  +---------+------------------+-----+---------+--------+ +---------+------------------+-----+---------+-------+ Left     Lt Pressure (mmHg)IndexWaveform Comment +---------+------------------+-----+---------+-------+ Brachial 175                                     +---------+------------------+-----+---------+-------+ ATA                             triphasicNonComp +---------+------------------+-----+---------+-------+ PTA                             triphasicNonComp +---------+------------------+-----+---------+-------+ Great Toe                       Normal   NonComp +---------+------------------+-----+---------+-------+  Summary: Right: Resting right ankle-brachial index indicates noncompressible right lower extremity arteries. The right toe-brachial index is normal. Left: Resting left ankle-brachial index indicates noncompressible left lower extremity arteries. The left toe-brachial index is normal. *See table(s) above for measurements and observations.  Electronically signed by Hortencia Pilar MD on 12/01/2021 at 5:01:37 PM.    Final        Assessment & Plan:   1. Lymphedema Recommend:  No surgery or intervention at this point in time.    I have reviewed my discussion with the patient regarding lymphedema and why it  causes symptoms.  Patient will continue wearing graduated compression on a daily basis. The patient should put the compression on first thing in the morning and removing them in the evening. The patient should not sleep in the compression.   In addition, behavioral modification throughout the day will be  continued.  This will include frequent elevation (such as in a recliner), use of over the counter pain medications as needed and exercise such as walking.  The systemic causes for chronic edema such as liver, kidney and cardiac etiologies do not appear to have significant changed over the past year.    Despite conservative treatments of at least 4 weeks including graduated compression therapy class 1 and behavioral modification including exercise and elevation the patient  has not obtained adequate control of the lymphedema.  The patient still has stage 3 lymphedema and therefore, I believe that a lymph pump should be added to improve the control of the patient's lymphedema.  Additionally, a lymph pump is warranted because it will reduce the risk of cellulitis and ulceration in the future.  Patient should follow-up in six months    2. Morbid obesity with BMI of 50.0-59.9, adult (HCC) Weight loss will greatly help the patient with lower extremity edema  3. Neuropathy Patient is extremely tender with the slightest touch.  Given the patient has no arterial insufficiency, this is likely contributing to the neuropathy  4. Acute congestive heart failure, unspecified heart failure type ([place]) When the patient has exacerbations this can contribute to her lower extremity edema  5. Essential hypertension Continue antihypertensive medications as already ordered, these medications have been reviewed and there are no changes at this time.     6. Pain  in both lower extremities Recommend:  The patient has atypical pain symptoms for vascular disease and on exam I do not find evidence of vascular pathology that would explain the patient's symptoms.  Noninvasive studies do not identify significant vascular problems  I suspect the patient is c/o pseudoclaudication.  Patient should have an evaluation of the LS spine which I defer to the primary service or the Spine service.  The patient should continue  walking and begin a more formal exercise program. The patient should continue his antiplatelet therapy and aggressive treatment of the lipid abnormalities.    Current Outpatient Medications on File Prior to Visit  Medication Sig Dispense Refill   albuterol (VENTOLIN HFA) 108 (90 Base) MCG/ACT inhaler Inhale 1-2 puffs into the lungs every 6 (six) hours as needed for wheezing or shortness of breath. Please instruct 8 g 2   benzonatate (TESSALON) 100 MG capsule Take 1 capsule (100 mg total) by mouth every 8 (eight) hours as needed for cough. 21 capsule 0   buPROPion (WELLBUTRIN XL) 150 MG 24 hr tablet Take 1 tablet (150 mg total) by mouth daily. 30 tablet 2   carvedilol (COREG) 25 MG tablet Take 1 tablet by mouth twice a day for blood pressure 180 tablet 1   cyanocobalamin (VITAMIN B12) 1000 MCG tablet Take 1 tablet (1,000 mcg total) by mouth daily. 90 tablet 1   FLUoxetine (PROZAC) 40 MG capsule Take 1 capsule (40 mg total) by mouth daily. 90 capsule 1   levothyroxine (EUTHYROX) 112 MCG tablet Take 1 tablet (112 mcg total) by mouth daily before breakfast. 90 tablet 1   mometasone (NASONEX) 50 MCG/ACT nasal spray Place 2 sprays into the nose daily. 1 each 12   spironolactone (ALDACTONE) 50 MG tablet Take 1 tablet by mouth once daily 90 tablet 3   Vitamin D, Ergocalciferol, (DRISDOL) 1.25 MG (50000 UNIT) CAPS capsule Take 1 capsule (50,000 Units total) by mouth every 7 (seven) days. 15 capsule 1   gabapentin (NEURONTIN) 400 MG capsule Take 1 capsule (400 mg total) by mouth at bedtime. (Patient not taking: Reported on 12/01/2021) 90 capsule 1   torsemide (DEMADEX) 20 MG tablet Take 2 tablets (40 mg total) by mouth daily. 180 tablet 1   No current facility-administered medications on file prior to visit.    There are no Patient Instructions on file for this visit. No follow-ups on file.   Kris Hartmann, NP

## 2021-12-05 ENCOUNTER — Telehealth (INDEPENDENT_AMBULATORY_CARE_PROVIDER_SITE_OTHER): Payer: Self-pay | Admitting: Nurse Practitioner

## 2021-12-05 NOTE — Telephone Encounter (Signed)
Patient called and wants a printout of her last appointment so she can send it to her Disability Office.  She ask that it be mailed to her.

## 2021-12-26 ENCOUNTER — Encounter (INDEPENDENT_AMBULATORY_CARE_PROVIDER_SITE_OTHER): Payer: Self-pay

## 2021-12-29 ENCOUNTER — Telehealth: Payer: Self-pay

## 2021-12-29 NOTE — Telephone Encounter (Signed)
I spoke to the patient about the Itamar device. The patient states that she had some personal issues going on and that she will wear the watch this weekend.

## 2022-01-02 ENCOUNTER — Ambulatory Visit: Payer: 59 | Admitting: Family Medicine

## 2022-01-05 ENCOUNTER — Ambulatory Visit (INDEPENDENT_AMBULATORY_CARE_PROVIDER_SITE_OTHER): Payer: 59 | Admitting: Family Medicine

## 2022-01-05 ENCOUNTER — Encounter: Payer: Self-pay | Admitting: Family Medicine

## 2022-01-05 VITALS — BP 137/85 | HR 75 | Temp 97.1°F | Resp 16 | Wt 357.0 lb

## 2022-01-05 DIAGNOSIS — Z23 Encounter for immunization: Secondary | ICD-10-CM | POA: Diagnosis not present

## 2022-01-05 DIAGNOSIS — E039 Hypothyroidism, unspecified: Secondary | ICD-10-CM

## 2022-01-05 DIAGNOSIS — E538 Deficiency of other specified B group vitamins: Secondary | ICD-10-CM | POA: Diagnosis not present

## 2022-01-05 DIAGNOSIS — R059 Cough, unspecified: Secondary | ICD-10-CM

## 2022-01-05 DIAGNOSIS — E559 Vitamin D deficiency, unspecified: Secondary | ICD-10-CM

## 2022-01-05 DIAGNOSIS — R0982 Postnasal drip: Secondary | ICD-10-CM | POA: Diagnosis not present

## 2022-01-05 DIAGNOSIS — F324 Major depressive disorder, single episode, in partial remission: Secondary | ICD-10-CM | POA: Diagnosis not present

## 2022-01-05 DIAGNOSIS — G629 Polyneuropathy, unspecified: Secondary | ICD-10-CM

## 2022-01-05 MED ORDER — MOMETASONE FUROATE 50 MCG/ACT NA SUSP: 2.0000 | Freq: Every day | NASAL | 12 refills | Status: DC

## 2022-01-05 MED ORDER — VITAMIN B-12 1000 MCG PO TABS
1000.0000 ug | ORAL_TABLET | Freq: Every day | ORAL | 1 refills | Status: DC
Start: 2022-01-05 — End: 2023-01-18

## 2022-01-05 MED ORDER — VITAMIN D (ERGOCALCIFEROL) 1.25 MG (50000 UNIT) PO CAPS: 50000.0000 [IU] | ORAL_CAPSULE | ORAL | 1 refills | Status: DC

## 2022-01-05 NOTE — Progress Notes (Signed)
Established Patient Office Visit  Subjective   Patient ID: Hannah Orr, female    DOB: 1975-07-01  Age: 46 y.o. MRN: [id number]  Chief Complaint  Patient presents with   Follow-up    3 mo follow up, flu shot, cough that wont go away, mucus causes pt to vomit in the mornings coughing causes pain pt states it's been a while since it started. x3 months     HPI cough persist.  It is nonproductive.  There is been no fever.  She is sometimes throws up phlegm in the morning.  She has been out of her Nasonex.  Postnasal drip is ongoing.  There has been no facial pressure or teeth pain.  Uses albuterol inhaler as needed for wheezing.  Was not clear about how often she is using it but does not feel as though her asthma is much of a problem.  Denies any reflux.  Chest x-ray has been clear.    Review of Systems  Constitutional: Negative.   HENT: Negative.    Eyes:  Negative for blurred vision, discharge and redness.  Respiratory:  Positive for cough. Negative for shortness of breath and wheezing.   Cardiovascular: Negative.   Gastrointestinal:  Negative for abdominal pain.  Genitourinary: Negative.   Musculoskeletal: Negative.  Negative for myalgias.  Skin:  Negative for rash.  Neurological:  Negative for tingling, loss of consciousness and weakness.  Endo/Heme/Allergies:  Negative for polydipsia.      Objective:     BP 137/85 (BP Location: Left Arm, Patient Position: Sitting, Cuff Size: Normal)   Pulse 75   Temp (!) 97.1 F (36.2 C) (Temporal)   Resp 16   Wt (!) 357 lb (161.9 kg)   SpO2 94%   BMI 59.41 kg/m  Wt Readings from Last 3 Encounters:  01/05/22 (!) 357 lb (161.9 kg)  12/01/21 (!) 357 lb (161.9 kg)  09/26/21 (!) 354 lb (160.6 kg)      Physical Exam Constitutional:      General: She is not in acute distress.    Appearance: Normal appearance. She is not ill-appearing, toxic-appearing or diaphoretic.  HENT:     Head: Normocephalic and atraumatic.     Right  Ear: External ear normal.     Left Ear: External ear normal.     Mouth/Throat:     Mouth: Mucous membranes are moist.     Pharynx: Oropharynx is clear. No oropharyngeal exudate or posterior oropharyngeal erythema.   Eyes:     General: No scleral icterus.       Right eye: No discharge.        Left eye: No discharge.     Extraocular Movements: Extraocular movements intact.     Conjunctiva/sclera: Conjunctivae normal.     Pupils: Pupils are equal, round, and reactive to light.  Cardiovascular:     Rate and Rhythm: Normal rate and regular rhythm.  Pulmonary:     Effort: Pulmonary effort is normal. No respiratory distress.     Breath sounds: Normal breath sounds. No wheezing or rales.  Musculoskeletal:     Cervical back: No rigidity or tenderness.  Skin:    General: Skin is warm and dry.  Neurological:     Mental Status: She is alert and oriented to person, place, and time.  Psychiatric:        Mood and Affect: Mood normal.        Behavior: Behavior normal.      No results found for any  visits on 01/05/22.    The 10-year ASCVD risk score (Arnett DK, et al., 2019) is: 2.1%    Assessment & Plan:   Problem List Items Addressed This Visit       Endocrine   Acquired hypothyroidism   Relevant Orders   TSH     Nervous and Auditory   Neuropathy   Relevant Medications   cyanocobalamin (VITAMIN B12) 1000 MCG tablet     Other   Vitamin D deficiency   Relevant Medications   Vitamin D, Ergocalciferol, (DRISDOL) 1.25 MG (50000 UNIT) CAPS capsule   Other Relevant Orders   VITAMIN D 25 Hydroxy (Vit-D Deficiency, Fractures)   Depression, major, single episode, in partial remission (HCC)   B12 deficiency - Primary   Relevant Medications   cyanocobalamin (VITAMIN B12) 1000 MCG tablet   Other Relevant Orders   Vitamin B12   Post-nasal drip   Relevant Medications   mometasone (NASONEX) 50 MCG/ACT nasal spray   Cough   Need for influenza vaccination   Relevant Orders   Flu  Vaccine QUAD 6+ mos PF IM (Fluarix Quad PF)    Return in about 6 months (around 07/06/2022), or if symptoms worsen or fail to improve.  Could consider referral to ENT or allergist.  Continue Nasonex.  Libby Maw, MD

## 2022-01-06 LAB — TSH: TSH: 3.61 u[IU]/mL (ref 0.35–5.50)

## 2022-01-06 LAB — VITAMIN B12: Vitamin B-12: 176 pg/mL — ABNORMAL LOW (ref 211–911)

## 2022-01-06 LAB — VITAMIN D 25 HYDROXY (VIT D DEFICIENCY, FRACTURES): VITD: 23.21 ng/mL — ABNORMAL LOW (ref 30.00–100.00)

## 2022-02-28 ENCOUNTER — Telehealth: Payer: Self-pay

## 2022-02-28 NOTE — Telephone Encounter (Signed)
I called the patient to speak with her about the Itamar Device, I LVM for her to return my call

## 2022-03-08 ENCOUNTER — Telehealth (INDEPENDENT_AMBULATORY_CARE_PROVIDER_SITE_OTHER): Payer: Self-pay

## 2022-03-08 NOTE — Telephone Encounter (Signed)
Patient called complaining of leg pain getting worse.  She states they are swollen and purple.  She is trying to elevate them but would like to know what else to do.

## 2022-03-09 NOTE — Telephone Encounter (Signed)
Returned patient call, no answer, left message.

## 2022-03-09 NOTE — Telephone Encounter (Signed)
She should be wearing her medical grade compression stockings.  Typically for patient with severe swelling, elevation alone is not enough.  Has she received a lymph pump yet? If so that will also help with swelling.  Also, if she has been gaining weight, she may need to see her cardiologist to ensure that she isn't having a possible heart failure exacerbation.

## 2022-03-13 ENCOUNTER — Encounter (INDEPENDENT_AMBULATORY_CARE_PROVIDER_SITE_OTHER): Payer: Self-pay

## 2022-03-13 NOTE — Telephone Encounter (Signed)
Left message for patient and mychart message sent to patient.

## 2022-03-20 NOTE — Telephone Encounter (Signed)
Patient is calling back in regards to later she received. Please advise

## 2022-03-29 ENCOUNTER — Other Ambulatory Visit: Payer: Self-pay | Admitting: *Deleted

## 2022-03-29 ENCOUNTER — Other Ambulatory Visit: Payer: Self-pay

## 2022-03-29 ENCOUNTER — Ambulatory Visit: Payer: Medicaid Other | Attending: Nurse Practitioner | Admitting: Nurse Practitioner

## 2022-03-29 ENCOUNTER — Encounter: Payer: Self-pay | Admitting: Nurse Practitioner

## 2022-03-29 ENCOUNTER — Telehealth: Payer: Self-pay | Admitting: *Deleted

## 2022-03-29 VITALS — BP 170/84 | HR 84 | Ht 65.0 in | Wt 366.0 lb

## 2022-03-29 DIAGNOSIS — R0602 Shortness of breath: Secondary | ICD-10-CM

## 2022-03-29 DIAGNOSIS — I517 Cardiomegaly: Secondary | ICD-10-CM

## 2022-03-29 DIAGNOSIS — R0683 Snoring: Secondary | ICD-10-CM

## 2022-03-29 DIAGNOSIS — I890 Lymphedema, not elsewhere classified: Secondary | ICD-10-CM | POA: Diagnosis not present

## 2022-03-29 DIAGNOSIS — I10 Essential (primary) hypertension: Secondary | ICD-10-CM

## 2022-03-29 DIAGNOSIS — I5032 Chronic diastolic (congestive) heart failure: Secondary | ICD-10-CM

## 2022-03-29 MED ORDER — TORSEMIDE 60 MG PO TABS: 60.0000 mg | ORAL_TABLET | Freq: Every day | ORAL | 3 refills | Status: AC

## 2022-03-29 NOTE — Patient Instructions (Signed)
Medication Instructions:  Increase Torsemide 60 mg daily  *If you need a refill on your cardiac medications before your next appointment, please call your pharmacy*   Lab Work:Your physician recommends that you return for lab work in 2 weeks.   BMET 2 weeks  If you have labs (blood work) drawn today and your tests are completely normal, you will receive your results only by: [place] (if you have MyChart) OR A paper copy in the mail If you have any lab test that is abnormal or we need to change your treatment, we will call you to review the results.   Testing/Procedures: NONE ordered at this time of appointment     Follow-Up: At [facility], you and your health needs are our priority.  As part of our continuing mission to provide you with exceptional heart care, we have created designated Provider Care Teams.  These Care Teams include your primary Cardiologist (physician) and Advanced Practice Providers (APPs -  Physician Assistants and Nurse Practitioners) who all work together to provide you with the care you need, when you need it.  We recommend signing up for the patient portal called "MyChart".  Sign up information is provided on this After Visit Summary.  MyChart is used to connect with patients for Virtual Visits (Telemedicine).  Patients are able to view lab/test results, encounter notes, upcoming appointments, etc.  Non-urgent messages can be sent to your provider as well.   To learn more about what you can do with MyChart, go to [email or web address].    Your next appointment:   4 month(s)  Provider:   Minus Breeding, MD     Other Instructions Monitor blood pressure. Report blood pressure consistently greater than 130/80.

## 2022-03-29 NOTE — Telephone Encounter (Signed)
Prior Authorization for [employer] sent to [employer] via Fax to [phone].

## 2022-03-29 NOTE — Progress Notes (Signed)
Office Visit    Patient Name: Hannah Orr Date of Encounter: 03/29/2022  Primary Care Provider:  Libby Maw, MD Primary Cardiologist:  Minus Breeding, MD  Chief Complaint    47 year old female with a history of cardiomegaly noted on CT, lymphedema, hypertension, suspected OSA, obesity, and depression who presents for follow-up related to hypertension and shortness of breath.  Past Medical History    Past Medical History:  Diagnosis Date   Depression    Hypertension    Past Surgical History:  Procedure Laterality Date   MANDIBLE SURGERY Right 12/11/1993    Allergies  Allergies  Allergen Reactions   Benazepril Hcl Hypertension    cough   Benazepril Hcl     cough     Labs/Other Studies Reviewed    The following studies were reviewed today: Echo 04/2020: IMPRESSIONS    1. Left ventricular ejection fraction, by estimation, is 60 to 65%. The  left ventricle has normal function. The left ventricle has no regional  wall motion abnormalities. Left ventricular diastolic parameters were  normal.   2. Right ventricular systolic function is low normal. The right  ventricular size is normal.   3. The mitral valve is grossly normal. No evidence of mitral valve  regurgitation.   4. The aortic valve is normal in structure. Aortic valve regurgitation is  not visualized.   ABIs 11/2021: Summary: Right: Resting right ankle-brachial index indicates noncompressible right lower extremity arteries. The right toe-brachial index is normal.  Left: Resting left ankle-brachial index indicates noncompressible left lower extremity arteries. The left toe-brachial index is normal.   *See table(s) above for measurements and observations.   Venous duplex c/ reflux 12/01/2021: Summary:  Right:  - No evidence of deep vein thrombosis seen in the right lower extremity,  from the common femoral through the popliteal veins.  - No evidence of superficial venous thrombosis  in the right lower  extremity.  - There is no evidence of venous reflux seen in the right lower extremity.  - No evidence of superficial venous reflux seen in the right greater  saphenous vein.  - No evidence of superficial venous reflux seen in the right short  saphenous vein.    Left:  - No evidence of deep vein thrombosis seen in the left lower extremity,  from the common femoral through the popliteal veins.  - No evidence of superficial venous thrombosis in the left lower  extremity.  - No evidence of superficial venous reflux seen in the left greater  saphenous vein.  - No evidence of superficial venous reflux seen in the left short  saphenous vein.  - Venous reflux is noted in the left common femoral vein.    *See table(s) above for measurements and observations.  Recent Labs: 05/09/2021: BUN 10; Creatinine, Ser 0.79; Potassium 3.9; Sodium 133 01/05/2022: TSH 3.61  Recent Lipid Panel    Component Value Date/Time   CHOL 228 (H) 11/08/2020 1020   TRIG 59.0 11/08/2020 1020   HDL 78.00 11/08/2020 1020   CHOLHDL 3 11/08/2020 1020   VLDL 11.8 11/08/2020 1020   LDLCALC 138 (H) 11/08/2020 1020   LDLDIRECT 132.0 11/08/2020 1020    History of Present Illness    47 year old female with the above past medical history including cardiomegaly, lymphedema, hypertension, suspected OSA, obesity, and depression.  She has had problems with chronic bilateral lower extremity edema, elevated BP.  She was hospitalized in 04/2020 for with suspected acute diastolic heart failure, echocardiogram at the time  showed no evidence of diastolic dysfunction, essentially normal.  CT of the chest did show cardiomegaly.  She was last seen in the office on 09/02/2021, stable overall from a cardiac standpoint.  BP was well controlled, lower extremity edema was stable.  She was referred for home sleep study. She was since evaluated by vascular surgery.  ABIs in 11/2021 showed noncompressible bilateral lower  extremity arteries, duplex with reflux was negative for DVT some mild venous reflux.  It was thought that patient likely had lymphedema.  Pneumatic compression was recommended.  She presents today for follow-up. Since her last visit she has been stable from a cardiac standpoint.  She does note a slight increase in shortness of breath with activity, mild weight gain.  She was prescribed an inhaler per PCP.  She notes some postnasal drip as well as a dry cough.  She denies chest pain, worsening edema, PND, orthopnea.   Home Medications    Current Outpatient Medications  Medication Sig Dispense Refill   albuterol (VENTOLIN HFA) 108 (90 Base) MCG/ACT inhaler Inhale 1-2 puffs into the lungs every 6 (six) hours as needed for wheezing or shortness of breath. Please instruct 8 g 2   benzonatate (TESSALON) 100 MG capsule Take 1 capsule (100 mg total) by mouth every 8 (eight) hours as needed for cough. 21 capsule 0   buPROPion (WELLBUTRIN XL) 150 MG 24 hr tablet Take 1 tablet (150 mg total) by mouth daily. 30 tablet 2   carvedilol (COREG) 25 MG tablet Take 1 tablet by mouth twice a day for blood pressure 180 tablet 1   cyanocobalamin (VITAMIN B12) 1000 MCG tablet Take 1 tablet (1,000 mcg total) by mouth daily. 90 tablet 1   FLUoxetine (PROZAC) 40 MG capsule Take 1 capsule (40 mg total) by mouth daily. 90 capsule 1   gabapentin (NEURONTIN) 400 MG capsule Take 1 capsule (400 mg total) by mouth at bedtime. 90 capsule 1   levothyroxine (EUTHYROX) 112 MCG tablet Take 1 tablet (112 mcg total) by mouth daily before breakfast. 90 tablet 1   mometasone (NASONEX) 50 MCG/ACT nasal spray Place 2 sprays into the nose daily. 1 each 12   spironolactone (ALDACTONE) 50 MG tablet Take 1 tablet by mouth once daily 90 tablet 3   Torsemide 60 MG TABS Take 60 mg by mouth daily. 90 tablet 3   Vitamin D, Ergocalciferol, (DRISDOL) 1.25 MG (50000 UNIT) CAPS capsule Take 1 capsule (50,000 Units total) by mouth every 7 (seven) days.  15 capsule 1   No current facility-administered medications for this visit.     Review of Systems    She denies chest pain, palpitations, pnd, orthopnea, n, v, dizziness, syncope, or early satiety. All other systems reviewed and are otherwise negative except as noted above.   Physical Exam    VS:  BP (!) 170/84   Pulse 84   Ht '5\' 5"'$  (1.651 m)   Wt (!) 366 lb (166 kg)   LMP  (LMP Unknown)   SpO2 96%   BMI 60.91 kg/m   GEN: Well nourished, well developed, in no acute distress. HEENT: normal. Neck: Supple, no JVD, carotid bruits, or masses. Cardiac: RRR, no murmurs, rubs, or gallops. No clubbing, cyanosis, nonpitting bilateral lower extremity edema.  Radials/DP/PT 2+ and equal bilaterally.  Respiratory:  Respirations regular and unlabored, clear to auscultation bilaterally. GI: Obese, soft, nontender, nondistended, BS + x 4. MS: no deformity or atrophy. Skin: warm and dry, no rash. Neuro:  Strength and sensation  are intact. Psych: Normal affect.  Accessory Clinical Findings    ECG personally reviewed by me today - No EKG in office today.    Lab Results  Component Value Date   WBC 2.6 (L) 11/08/2020   HGB 12.2 11/08/2020   HCT 37.9 11/08/2020   MCV 108.8 (H) 11/08/2020   PLT 347.0 11/08/2020   Lab Results  Component Value Date   CREATININE 0.79 05/09/2021   BUN 10 05/09/2021   NA 133 (L) 05/09/2021   K 3.9 05/09/2021   CL 96 05/09/2021   CO2 22 05/09/2021   Lab Results  Component Value Date   ALT 31 11/08/2020   AST 53 (H) 11/08/2020   ALKPHOS 65 11/08/2020   BILITOT 0.9 11/08/2020   Lab Results  Component Value Date   CHOL 228 (H) 11/08/2020   HDL 78.00 11/08/2020   LDLCALC 138 (H) 11/08/2020   LDLDIRECT 132.0 11/08/2020   TRIG 59.0 11/08/2020   CHOLHDL 3 11/08/2020    Lab Results  Component Value Date   HGBA1C 5.3 08/08/2013    Assessment & Plan    1. Cardiomegaly/shortness of breath: CT of the chest in 04/2020 showed evidence of cardiomegaly.   Echo in 04/2020 showed no evidence of diastolic dysfunction, EF 60 to 65%, normal LV function, low normal RV function.  She notes increased shortness of breath, mild weight gain. She has also noticed a dry cough as well as some postnasal drip.  She has chronic bilateral lower extremity edema in the setting of lymphedema.  Otherwise, euvolemic and well compensated on exam.  She has generally been well-managed on torsemide.  Will increase torsemide to 60 mg daily to see if her symptoms improve.  Will check BMET in 2 weeks.  I suspect her shortness of breath is multifactorial in the setting of sedentary lifestyle, morbid obesity, possible underlying pulmonary issues (she was recently prescribed an inhaler per PCP), and mild fluid retention.  If symptoms persist despite increased diuretic therapy, consider repeat echocardiogram versus ischemic evaluation.  Continue carvedilol, spironolactone, torsemide.  2. Hypertension: BP elevated above goal in office today, recently well controlled. She has not taken her BP meds today. Continue to monitor and report BP consistently > 130/80.  She did not tolerate ACE inhibitor or thiazide diuretic in the past, did not tolerate amlodipine due to swelling.  Could consider trial of ARB if needed in the future.  For now, continue current antihypertensive regimen.   3. Lymphedema: Stable, chronic. Following with vascular.   4. Suspected OSA/history of snoring: [place] pending.  She had technical difficulties that have delayed her study-these were addressed at today's visit.  5. Obesity: She is rather sedentary. Encouraged increased activity as tolerated.   6. Disposition: Follow-up in 4 months with Dr. Percival Spanish.    HYPERTENSION CONTROL Vitals:   03/29/22 1056 03/29/22 1125  BP: (!) 185/89 (!) 170/84    The patient's blood pressure is elevated above target today.  In order to address the patient's elevated BP: Blood pressure will be monitored at home to determine if  medication changes need to be made.; Follow up with general cardiology has been recommended.      Lenna Sciara, NP 03/29/2022, 11:52 AM

## 2022-04-18 ENCOUNTER — Telehealth: Payer: Self-pay

## 2022-04-18 NOTE — Telephone Encounter (Signed)
I CALLED THE PATIENT TO SPEAK WITH HER ABOUT THE ITAMAR DEVICE, LVM FOR HER TO RETURN MY CALL.

## 2022-05-08 ENCOUNTER — Telehealth: Payer: Self-pay | Admitting: Cardiology

## 2022-05-08 NOTE — Telephone Encounter (Signed)
Called pt. She states she would like her last office notes faxed to Carlos Levering at [phone]. If we have any questions Jennofer can be reached at [phone] ext [phone]. Last office noted faxed to the given number via Epic.

## 2022-05-08 NOTE — Telephone Encounter (Signed)
  Pt said, Hannah Orr financial told her they sent a request to get OV notes and paperwork for her long term disability. She said, our office need to send it in before Sunday 05/14/22

## 2022-05-31 NOTE — Progress Notes (Deleted)
MRN : [id number]  Hannah Orr is a 47 y.o. (09-29-1975) female who presents with chief complaint of legs swell.  History of Present Illness:  The patient returns to the office for followup evaluation regarding leg swelling.  The swelling has persisted and the pain associated with swelling continues. There have not been any interval development of a ulcerations or wounds.   Since the previous visit the patient has been wearing graduated compression stockings and has noted little if any improvement in the lymphedema.    The patient also states elevation during the day and exercise is being done too.    Today noninvasive studies show she has noncompressible ABIs bilaterally but with triphasic tibial artery waveforms bilaterally with normal toe waveforms bilaterally.  The patient has no evidence of DVT or superficial thrombophlebitis bilaterally.  Left has evidence of deep venous insufficiency.  No superficial venous reflux noted bilaterally   No outpatient medications have been marked as taking for the 06/01/22 encounter (Appointment) with Delana Meyer, Dolores Lory, MD.    Past Medical History:  Diagnosis Date   Depression    Hypertension     Past Surgical History:  Procedure Laterality Date   MANDIBLE SURGERY Right 12/11/1993    Social History Social History   Tobacco Use   Smoking status: Never   Smokeless tobacco: Never  Vaping Use   Vaping Use: Never used  Substance Use Topics   Alcohol use: Not Currently    Comment: Stopped drinking December 2021.    Drug use: No    Family History Family History  Problem Relation Age of Onset   Hypertension Mother    Breast cancer Mother    Hypertension Father    Prostate cancer Father    Cancer Neg Hx    Heart disease Neg Hx    Hyperlipidemia Neg Hx    Kidney disease Neg Hx    Diabetes Neg Hx    Early death Neg Hx    Hearing loss Neg Hx    Stroke Neg Hx     Allergies  Allergen Reactions   Benazepril Hcl  Hypertension    cough   Benazepril Hcl     cough     REVIEW OF SYSTEMS (Negative unless checked)  Constitutional: [] Weight loss  [] Fever  [] Chills Cardiac: [] Chest pain   [] Chest pressure   [] Palpitations   [] Shortness of breath when laying flat   [] Shortness of breath with exertion. Vascular:  [] Pain in legs with walking   [x] Pain in legs with standing  [] History of DVT   [] Phlebitis   [x] Swelling in legs   [] Varicose veins   [] Non-healing ulcers Pulmonary:   [] Uses home oxygen   [] Productive cough   [] Hemoptysis   [] Wheeze  [] COPD   [] Asthma Neurologic:  [] Dizziness   [] Seizures   [] History of stroke   [] History of TIA  [] Aphasia   [] Vissual changes   [] Weakness or numbness in arm   [x] Weakness or numbness in leg Musculoskeletal:   [] Joint swelling   [] Joint pain   [] Low back pain Hematologic:  [] Easy bruising  [] Easy bleeding   [] Hypercoagulable state   [] Anemic Gastrointestinal:  [] Diarrhea   [] Vomiting  [x] Gastroesophageal reflux/heartburn   [] Difficulty swallowing. Genitourinary:  [] Chronic kidney disease   [] Difficult urination  [] Frequent urination   [] Blood in urine Skin:  [] Rashes   [] Ulcers  Psychological:  [x] History of anxiety   [x]  History of major depression.  Physical Examination  There were no vitals filed for  this visit. There is no height or weight on file to calculate BMI. Gen: WD/WN, NAD Head: [place]/AT, No temporalis wasting.  Ear/Nose/Throat: Hearing grossly intact, nares w/o erythema or drainage, pinna without lesions Eyes: PER, EOMI, sclera nonicteric.  Neck: Supple, no gross masses.  No JVD.  Pulmonary:  Good air movement, no audible wheezing, no use of accessory muscles.  Cardiac: RRR, precordium not hyperdynamic. Vascular:  scattered varicosities present bilaterally.  Mild venous stasis changes to the legs bilaterally.  3-4+ soft pitting edema, CEAP [id number]  Vessel Right Left  Radial Palpable Palpable  Gastrointestinal: soft, non-distended. No guarding/no  peritoneal signs.  Musculoskeletal: M/S 5/5 throughout.  No deformity.  Neurologic: CN 2-12 intact. Pain and light touch intact in extremities.  Symmetrical.  Speech is fluent. Motor exam as listed above. Psychiatric: Judgment intact, Mood & affect appropriate for pt's clinical situation. Dermatologic: Venous rashes no ulcers noted.  No changes consistent with cellulitis. Lymph : No lichenification or skin changes of chronic lymphedema.  CBC Lab Results  Component Value Date   WBC 2.6 (L) 11/08/2020   HGB 12.2 11/08/2020   HCT 37.9 11/08/2020   MCV 108.8 (H) 11/08/2020   PLT 347.0 11/08/2020    BMET    Component Value Date/Time   NA 133 (L) 05/09/2021 1152   NA 134 03/25/2021 1412   K 3.9 05/09/2021 1152   CL 96 05/09/2021 1152   CO2 22 05/09/2021 1152   GLUCOSE 95 05/09/2021 1152   BUN 10 05/09/2021 1152   BUN 9 03/25/2021 1412   CREATININE 0.79 05/09/2021 1152   CREATININE 0.75 07/28/2020 1205   CALCIUM 9.5 05/09/2021 1152   GFRNONAA >60 07/28/2020 1205   CrCl cannot be calculated (Patient's most recent lab result is older than the maximum 21 days allowed.).  COAG No results found for: "INR", "PROTIME"  Radiology No results found.   Assessment/Plan There are no diagnoses linked to this encounter.   Hortencia Pilar, MD  05/31/2022 12:08 PM

## 2022-06-01 ENCOUNTER — Ambulatory Visit (INDEPENDENT_AMBULATORY_CARE_PROVIDER_SITE_OTHER): Payer: 59 | Admitting: Vascular Surgery

## 2022-06-08 ENCOUNTER — Ambulatory Visit (INDEPENDENT_AMBULATORY_CARE_PROVIDER_SITE_OTHER): Payer: 59 | Admitting: Vascular Surgery

## 2022-06-18 NOTE — Progress Notes (Unsigned)
MRN : [id number]  Hannah Orr is a 47 y.o. ([birth year]) female who presents with chief complaint of legs swell.  History of Present Illness:   The patient returns to the office for followup evaluation regarding leg swelling.  The swelling has persisted and the pain associated with swelling continues.  In fact she feels that it is substantially worse to the point where her mobility has now been profoundly affected.  She no longer is able to carry on many activities of daily living and has been having great difficulty at work trying to perform her obligated tasks.  There have not been any interval development of a ulcerations or wounds.  Since the previous visit the patient has been wearing graduated compression stockings and has noted little if any improvement in the lymphedema. The patient has been using compression routinely morning until night.  The patient also states elevation during the day and exercise is being done too.  No outpatient medications have been marked as taking for the 06/19/22 encounter (Appointment) with Gilda Crease, Latina Craver, MD.    Past Medical History:  Diagnosis Date   Depression    Hypertension     Past Surgical History:  Procedure Laterality Date   MANDIBLE SURGERY Right 12/11/1993    Social History Social History   Tobacco Use   Smoking status: Never   Smokeless tobacco: Never  Vaping Use   Vaping Use: Never used  Substance Use Topics   Alcohol use: Not Currently    Comment: Stopped drinking December 2021.    Drug use: No    Family History Family History  Problem Relation Age of Onset   Hypertension Mother    Breast cancer Mother    Hypertension Father    Prostate cancer Father    Cancer Neg Hx    Heart disease Neg Hx    Hyperlipidemia Neg Hx    Kidney disease Neg Hx    Diabetes Neg Hx    Early death Neg Hx    Hearing loss Neg Hx    Stroke Neg Hx     Allergies  Allergen Reactions   Benazepril Hcl Hypertension    cough    Benazepril Hcl     cough     REVIEW OF SYSTEMS (Negative unless checked)  Constitutional: [] Weight loss  [] Fever  [] Chills Cardiac: [] Chest pain   [] Chest pressure   [] Palpitations   [] Shortness of breath when laying flat   [] Shortness of breath with exertion. Vascular:  [] Pain in legs with walking   [x] Pain in legs with standing  [] History of DVT   [] Phlebitis   [x] Swelling in legs   [] Varicose veins   [] Non-healing ulcers Pulmonary:   [] Uses home oxygen   [] Productive cough   [] Hemoptysis   [] Wheeze  [] COPD   [] Asthma Neurologic:  [] Dizziness   [] Seizures   [] History of stroke   [] History of TIA  [] Aphasia   [] Vissual changes   [] Weakness or numbness in arm   [x] Weakness or numbness in leg Musculoskeletal:   [] Joint swelling   [] Joint pain   [] Low back pain Hematologic:  [] Easy bruising  [] Easy bleeding   [] Hypercoagulable state   [] Anemic Gastrointestinal:  [] Diarrhea   [] Vomiting  [x] Gastroesophageal reflux/heartburn   [] Difficulty swallowing. Genitourinary:  [] Chronic kidney disease   [] Difficult urination  [] Frequent urination   [] Blood in urine Skin:  [] Rashes   [] Ulcers  Psychological:  [] History of anxiety   []  History of major depression.  Physical Examination  There were no vitals filed for this visit. There is no height or weight on file to calculate BMI. Gen: WD/WN, NAD Head: Remy/AT, No temporalis wasting.  Ear/Nose/Throat: Hearing grossly intact, nares w/o erythema or drainage, pinna without lesions Eyes: PER, EOMI, sclera nonicteric.  Neck: Supple, no gross masses.  No JVD.  Pulmonary:  Good air movement, no audible wheezing, no use of accessory muscles.  Cardiac: RRR, precordium not hyperdynamic. Vascular:  scattered varicosities present bilaterally.  Moderate venous stasis changes to the legs bilaterally.  4+ soft pitting edema, CEAP C4sEpAsPr  Vessel Right Left  Radial Palpable Palpable  Gastrointestinal: soft, non-distended. No guarding/no peritoneal signs.   Musculoskeletal: M/S 5/5 throughout.  No deformity.  Neurologic: CN 2-12 intact. Pain and light touch intact in extremities.  Symmetrical.  Speech is fluent. Motor exam as listed above. Psychiatric: Judgment intact, Mood & affect appropriate for pt's clinical situation. Dermatologic: Venous rashes no ulcers noted.  No changes consistent with cellulitis. Lymph : No lichenification or skin changes of chronic lymphedema.  CBC Lab Results  Component Value Date   WBC 2.6 (L) 11/08/2020   HGB 12.2 11/08/2020   HCT 37.9 11/08/2020   MCV 108.8 (H) 11/08/2020   PLT 347.0 11/08/2020    BMET    Component Value Date/Time   NA 133 (L) 05/09/2021 1152   NA 134 03/25/2021 1412   K 3.9 05/09/2021 1152   CL 96 05/09/2021 1152   CO2 22 05/09/2021 1152   GLUCOSE 95 05/09/2021 1152   BUN 10 05/09/2021 1152   BUN 9 03/25/2021 1412   CREATININE 0.79 05/09/2021 1152   CREATININE 0.75 07/28/2020 1205   CALCIUM 9.5 05/09/2021 1152   GFRNONAA >60 07/28/2020 1205   CrCl cannot be calculated (Patient's most recent lab result is older than the maximum 21 days allowed.).  COAG No results found for: "INR", "PROTIME"  Radiology No results found.   Assessment/Plan 1. Lymphedema Recommend:  No surgery or intervention at this point in time.   The Patient is CEAP C4sEpAsPr.  The patient has been wearing compression for more than 12 weeks with no or little benefit.  The patient has been exercising daily for more than 12 weeks. The patient has been elevating and taking OTC pain medications for more than 12 weeks.  None of these have have eliminated the pain related to the lymphedema or the discomfort regarding excessive swelling and venous congestion.    I have reviewed my discussion with the patient regarding lymphedema and why it  causes symptoms.  Patient will continue wearing graduated compression on a daily basis. The patient should put the compression on first thing in the morning and removing  them in the evening. The patient should not sleep in the compression.   In addition, behavioral modification throughout the day will be continued.  This will include frequent elevation (such as in a recliner), use of over the counter pain medications as needed and exercise such as walking.  The systemic causes for chronic edema such as liver, kidney and cardiac etiologies do not appear to have significant changed over the past year.    The patient has chronic , severe lymphedema with hyperpigmentation of the skin and has done MLD, skin care, medication, diet, exercise, elevation and compression for 4 weeks with no improvement,  I am recommending a lymphedema pump.  The patient still has stage 3 lymphedema and therefore, I believe that a lymph pump is needed to improve the control of the patient's  lymphedema and improve the quality of life.  Additionally, a lymph pump is warranted because it will reduce the risk of cellulitis and ulceration in the future.  Patient should follow-up in six months   2. Venous insufficiency Recommend:  No surgery or intervention at this point in time.   The Patient is CEAP C4sEpAsPr.  The patient has been wearing compression for more than 12 weeks with no or little benefit.  The patient has been exercising daily for more than 12 weeks. The patient has been elevating and taking OTC pain medications for more than 12 weeks.  None of these have have eliminated the pain related to the lymphedema or the discomfort regarding excessive swelling and venous congestion.    I have reviewed my discussion with the patient regarding lymphedema and why it  causes symptoms.  Patient will continue wearing graduated compression on a daily basis. The patient should put the compression on first thing in the morning and removing them in the evening. The patient should not sleep in the compression.   In addition, behavioral modification throughout the day will be continued.  This will  include frequent elevation (such as in a recliner), use of over the counter pain medications as needed and exercise such as walking.  The systemic causes for chronic edema such as liver, kidney and cardiac etiologies do not appear to have significant changed over the past year.    The patient has chronic , severe lymphedema with hyperpigmentation of the skin and has done MLD, skin care, medication, diet, exercise, elevation and compression for 4 weeks with no improvement,  I am recommending a lymphedema pump.  The patient still has stage 3 lymphedema and therefore, I believe that a lymph pump is needed to improve the control of the patient's lymphedema and improve the quality of life.  Additionally, a lymph pump is warranted because it will reduce the risk of cellulitis and ulceration in the future.  Patient should follow-up in six months   3. Essential hypertension Continue antihypertensive medications as already ordered, these medications have been reviewed and there are no changes at this time.  4. Gastroesophageal reflux disease, unspecified whether esophagitis present Continue PPI as already ordered, this medication has been reviewed and there are no changes at this time.  Avoidence of caffeine and alcohol  Moderate elevation of the head of the bed     Levora Dredge, MD  06/18/2022 11:12 AM

## 2022-06-19 ENCOUNTER — Ambulatory Visit (INDEPENDENT_AMBULATORY_CARE_PROVIDER_SITE_OTHER): Payer: Medicaid Other | Admitting: Vascular Surgery

## 2022-06-19 ENCOUNTER — Encounter (INDEPENDENT_AMBULATORY_CARE_PROVIDER_SITE_OTHER): Payer: Self-pay | Admitting: Vascular Surgery

## 2022-06-19 VITALS — BP 174/86 | HR 73 | Resp 18

## 2022-06-19 DIAGNOSIS — I10 Essential (primary) hypertension: Secondary | ICD-10-CM

## 2022-06-19 DIAGNOSIS — I872 Venous insufficiency (chronic) (peripheral): Secondary | ICD-10-CM | POA: Diagnosis not present

## 2022-06-19 DIAGNOSIS — I890 Lymphedema, not elsewhere classified: Secondary | ICD-10-CM

## 2022-06-19 DIAGNOSIS — K219 Gastro-esophageal reflux disease without esophagitis: Secondary | ICD-10-CM | POA: Diagnosis not present

## 2022-06-21 ENCOUNTER — Encounter (INDEPENDENT_AMBULATORY_CARE_PROVIDER_SITE_OTHER): Payer: Self-pay | Admitting: Vascular Surgery

## 2022-07-06 ENCOUNTER — Ambulatory Visit: Payer: 59 | Admitting: Family Medicine

## 2022-07-17 ENCOUNTER — Encounter: Payer: Self-pay | Admitting: Family Medicine

## 2022-07-17 ENCOUNTER — Ambulatory Visit: Payer: Medicaid Other | Admitting: Family Medicine

## 2022-07-17 DIAGNOSIS — E039 Hypothyroidism, unspecified: Secondary | ICD-10-CM

## 2022-07-17 DIAGNOSIS — F324 Major depressive disorder, single episode, in partial remission: Secondary | ICD-10-CM

## 2022-07-17 DIAGNOSIS — G629 Polyneuropathy, unspecified: Secondary | ICD-10-CM

## 2022-07-17 DIAGNOSIS — E538 Deficiency of other specified B group vitamins: Secondary | ICD-10-CM | POA: Diagnosis not present

## 2022-07-17 DIAGNOSIS — E559 Vitamin D deficiency, unspecified: Secondary | ICD-10-CM | POA: Diagnosis not present

## 2022-07-17 DIAGNOSIS — I10 Essential (primary) hypertension: Secondary | ICD-10-CM

## 2022-07-17 DIAGNOSIS — Z6841 Body Mass Index (BMI) 40.0 and over, adult: Secondary | ICD-10-CM

## 2022-07-17 MED ORDER — GABAPENTIN 400 MG PO CAPS: 400.0000 mg | ORAL_CAPSULE | Freq: Every day | ORAL | 1 refills | Status: DC

## 2022-07-17 NOTE — Progress Notes (Signed)
Established Patient Office Visit   Subjective:  Patient ID: Hannah Orr, female    DOB: Apr 18, 1975  Age: 47 y.o. MRN: [id number]  Chief Complaint  Patient presents with   Medical Management of Chronic Issues    6 month follow up, concerns about continued pains in knees and legs, some numbness in hands. Discuss pills for weight loss.    Will follow-up of below.  Blood pressure is elevated today.  She reports that she has not taken her medications yet.  When asked about B12 and vitamin D supplements, I was not sure as to whether or not she is actually been taking them.  Continues with fluoxetine and bupropion.  Continues with levothyroxine.  Unfortunately weight has increased since last visit. HPI Encounter Diagnoses  Name Primary?   Morbid obesity with BMI of 60.0-69.9, adult (HCC) Yes   B12 deficiency    Vitamin D deficiency    Depression, major, single episode, in partial remission (HCC)    Acquired hypothyroidism    Neuropathy    Essential hypertension       Review of Systems  Constitutional: Negative.   HENT: Negative.    Eyes:  Negative for blurred vision, discharge and redness.  Respiratory: Negative.    Cardiovascular: Negative.   Gastrointestinal:  Negative for abdominal pain.  Genitourinary: Negative.   Musculoskeletal: Negative.  Negative for myalgias.  Skin:  Negative for rash.  Neurological:  Negative for tingling, loss of consciousness and weakness.  Endo/Heme/Allergies:  Negative for polydipsia.       07/17/2022    1:48 PM 06/24/2021   11:23 AM 05/09/2021   11:22 AM  Depression screen PHQ 2/9  Decreased Interest 0 0 1  Down, Depressed, Hopeless 0 0 0  PHQ - 2 Score 0 0 1     Current Outpatient Medications:    albuterol (VENTOLIN HFA) 108 (90 Base) MCG/ACT inhaler, Inhale 1-2 puffs into the lungs every 6 (six) hours as needed for wheezing or shortness of breath. Please instruct, Disp: 8 g, Rfl: 2   buPROPion (WELLBUTRIN XL) 150 MG 24 hr tablet,  Take 1 tablet (150 mg total) by mouth daily., Disp: 30 tablet, Rfl: 2   carvedilol (COREG) 25 MG tablet, Take 1 tablet by mouth twice a day for blood pressure, Disp: 180 tablet, Rfl: 1   cyanocobalamin (VITAMIN B12) 1000 MCG tablet, Take 1 tablet (1,000 mcg total) by mouth daily., Disp: 90 tablet, Rfl: 1   FLUoxetine (PROZAC) 40 MG capsule, Take 1 capsule (40 mg total) by mouth daily., Disp: 90 capsule, Rfl: 1   levothyroxine (EUTHYROX) 112 MCG tablet, Take 1 tablet (112 mcg total) by mouth daily before breakfast., Disp: 90 tablet, Rfl: 1   mometasone (NASONEX) 50 MCG/ACT nasal spray, Place 2 sprays into the nose daily., Disp: 1 each, Rfl: 12   spironolactone (ALDACTONE) 50 MG tablet, Take 1 tablet by mouth once daily, Disp: 90 tablet, Rfl: 3   Torsemide 60 MG TABS, Take 60 mg by mouth daily., Disp: 90 tablet, Rfl: 3   Vitamin D, Ergocalciferol, (DRISDOL) 1.25 MG (50000 UNIT) CAPS capsule, Take 1 capsule (50,000 Units total) by mouth every 7 (seven) days., Disp: 15 capsule, Rfl: 1   gabapentin (NEURONTIN) 400 MG capsule, Take 1 capsule (400 mg total) by mouth at bedtime., Disp: 90 capsule, Rfl: 1   Objective:     BP (!) 206/116 (BP Location: Right Arm, Patient Position: Sitting, Cuff Size: Large)   Pulse 77   Temp Marland Kitchen)  97.2 F (36.2 C) (Temporal)   Ht 5\' 5"  (1.651 m)   Wt (!) 389 lb (176.4 kg)   SpO2 94%   BMI 64.73 kg/m  Wt Readings from Last 3 Encounters:  07/17/22 (!) 389 lb (176.4 kg)  03/29/22 (!) 366 lb (166 kg)  01/05/22 (!) 357 lb (161.9 kg)      Physical Exam Constitutional:      General: She is not in acute distress.    Appearance: Normal appearance. She is obese. She is not ill-appearing, toxic-appearing or diaphoretic.  HENT:     Head: Normocephalic and atraumatic.     Right Ear: External ear normal.     Left Ear: External ear normal.  Eyes:     General: No scleral icterus.       Right eye: No discharge.        Left eye: No discharge.     Extraocular Movements:  Extraocular movements intact.     Conjunctiva/sclera: Conjunctivae normal.  Pulmonary:     Effort: Pulmonary effort is normal. No respiratory distress.  Skin:    General: Skin is warm and dry.  Neurological:     Mental Status: She is alert and oriented to person, place, and time.  Psychiatric:        Mood and Affect: Mood normal.        Behavior: Behavior normal.      No results found for any visits on 07/17/22.    The ASCVD Risk score (Arnett DK, et al., 2019) failed to calculate for the following reasons:   The valid systolic blood pressure range is 90 to 200 mmHg    Assessment & Plan:   Morbid obesity with BMI of 60.0-69.9, adult (HCC) -     Amb Ref to Medical Weight Management -     Amb Referral to Bariatric Surgery  B12 deficiency -     Vitamin B12  Vitamin D deficiency -     VITAMIN D 25 Hydroxy (Vit-D Deficiency, Fractures)  Depression, major, single episode, in partial remission (HCC)  Acquired hypothyroidism -     TSH -     CBC  Neuropathy -     Gabapentin; Take 1 capsule (400 mg total) by mouth at bedtime.  Dispense: 90 capsule; Refill: 1  Essential hypertension    Return in about 6 months (around 01/17/2023), or if symptoms worsen or fail to improve.  Urged her to go home and take her medicines as directed follow-up with cardiology as soon as possible.  Explained that she is risking having a heart attack, stroke, kidney disease and possibly blindness.  Regarding her obesity, I believe that bariatric surgery would be her most relevant choice for therapy.  Restart gabapentin.  Mliss Sax, MD

## 2022-07-18 LAB — CBC
HCT: 37.3 % (ref 36.0–46.0)
Hemoglobin: 12.4 g/dL (ref 12.0–15.0)
MCHC: 33.2 g/dL (ref 30.0–36.0)
MCV: 113.3 fl — ABNORMAL HIGH (ref 78.0–100.0)
Platelets: 293 10*3/uL (ref 150.0–400.0)
RBC: 3.29 Mil/uL — ABNORMAL LOW (ref 3.87–5.11)
RDW: 13.9 % (ref 11.5–15.5)
WBC: 2.7 10*3/uL — ABNORMAL LOW (ref 4.0–10.5)

## 2022-07-18 LAB — TSH: TSH: 6.5 u[IU]/mL — ABNORMAL HIGH (ref 0.35–5.50)

## 2022-07-18 LAB — VITAMIN B12: Vitamin B-12: 131 pg/mL — ABNORMAL LOW (ref 211–911)

## 2022-07-18 LAB — VITAMIN D 25 HYDROXY (VIT D DEFICIENCY, FRACTURES): VITD: 28.49 ng/mL — ABNORMAL LOW (ref 30.00–100.00)

## 2022-07-25 ENCOUNTER — Other Ambulatory Visit: Payer: Self-pay | Admitting: Family Medicine

## 2022-07-25 DIAGNOSIS — Z1231 Encounter for screening mammogram for malignant neoplasm of breast: Secondary | ICD-10-CM

## 2022-07-26 ENCOUNTER — Other Ambulatory Visit: Payer: Self-pay

## 2022-07-26 ENCOUNTER — Encounter (INDEPENDENT_AMBULATORY_CARE_PROVIDER_SITE_OTHER): Payer: Medicaid Other | Admitting: Adult Health

## 2022-07-26 DIAGNOSIS — E559 Vitamin D deficiency, unspecified: Secondary | ICD-10-CM

## 2022-07-26 MED ORDER — VITAMIN D (ERGOCALCIFEROL) 1.25 MG (50000 UNIT) PO CAPS
50000.0000 [IU] | ORAL_CAPSULE | ORAL | 1 refills | Status: AC
Start: 2022-07-26 — End: ?

## 2022-07-28 ENCOUNTER — Ambulatory Visit: Payer: Medicaid Other | Admitting: Cardiology

## 2022-08-07 ENCOUNTER — Other Ambulatory Visit: Payer: Self-pay | Admitting: Family Medicine

## 2022-08-07 DIAGNOSIS — F324 Major depressive disorder, single episode, in partial remission: Secondary | ICD-10-CM

## 2022-08-10 ENCOUNTER — Other Ambulatory Visit: Payer: Self-pay | Admitting: Family Medicine

## 2022-08-10 DIAGNOSIS — E039 Hypothyroidism, unspecified: Secondary | ICD-10-CM

## 2022-08-14 ENCOUNTER — Inpatient Hospital Stay: Admission: RE | Admit: 2022-08-14 | Payer: Medicaid Other | Source: Ambulatory Visit

## 2022-08-30 ENCOUNTER — Ambulatory Visit: Payer: Medicaid Other | Admitting: Cardiology

## 2022-09-01 ENCOUNTER — Ambulatory Visit: Payer: Medicaid Other

## 2022-09-12 ENCOUNTER — Other Ambulatory Visit: Payer: Self-pay | Admitting: Cardiology

## 2022-09-12 DIAGNOSIS — I160 Hypertensive urgency: Secondary | ICD-10-CM

## 2022-09-12 DIAGNOSIS — I10 Essential (primary) hypertension: Secondary | ICD-10-CM

## 2022-11-06 ENCOUNTER — Other Ambulatory Visit: Payer: Self-pay | Admitting: Cardiology

## 2022-11-22 ENCOUNTER — Telehealth (INDEPENDENT_AMBULATORY_CARE_PROVIDER_SITE_OTHER): Payer: Self-pay | Admitting: Nurse Practitioner

## 2022-11-22 NOTE — Telephone Encounter (Signed)
After 4 weeks of daily exercise, elevation, and compression patient lymphedema and hyperpigmentation has not improved. Recommendation of pump.  Measurements:  Left ankle 43.1cm (8/20); 43.6cm(9/17)  Right ankle 47.2cm(8/20) 47.4cm(9/17)  Left calf 62.5cm(8/20); 62.7cm(9/17)  Right calf 66.9cm(8/20); 67.0cm(9/17)

## 2022-11-27 ENCOUNTER — Other Ambulatory Visit: Payer: Self-pay | Admitting: Family Medicine

## 2022-11-27 DIAGNOSIS — F324 Major depressive disorder, single episode, in partial remission: Secondary | ICD-10-CM

## 2022-12-06 NOTE — Progress Notes (Deleted)
Cardiology Office Note:   Date:  12/06/2022  ID:  Hannah Orr, DOB 04-25-1975, MRN [id number] PCP: Mliss Sax, MD  [facility] Providers Cardiologist:  Rollene Rotunda, MD {  History of Present Illness:   Hannah Orr is a 47 y.o. female  with the above past medical history including cardiomegaly.  She has had problems with chronic bilateral lower extremity edema, elevated BP.  She was hospitalized in 04/2020 for with suspected acute diastolic heart failure, echocardiogram at the time showed no evidence of diastolic dysfunction, essentially normal.  CT of the chest did show cardiomegaly.  She was last seen in the office on 09/02/2021, stable overall from a cardiac standpoint.  BP was well controlled, lower extremity edema was stable.  She was referred for home sleep study. She was since evaluated by vascular surgery.  ABIs in 11/2021 showed noncompressible bilateral lower extremity arteries, duplex with reflux was negative for DVT some mild venous reflux.  It was thought that patient likely had lymphedema.  Pneumatic compression was recommended.   She presents today for follow-up. ***   ***  Since her last visit she has been stable from a cardiac standpoint.  She does note a slight increase in shortness of breath with activity, mild weight gain.  She was prescribed an inhaler per PCP.  She notes some postnasal drip as well as a dry cough.  She denies chest pain, worsening edema, PND, orthopnea.     ROS: ***  Studies Reviewed:    EKG:       ***  Risk Assessment/Calculations:   {Does this patient have ATRIAL FIBRILLATION?:[id number]} No BP recorded.  {Refresh Note OR Click here to enter BP  :1}***        Physical Exam:   VS:  There were no vitals taken for this visit.   Wt Readings from Last 3 Encounters:  07/17/22 (!) 389 lb (176.4 kg)  03/29/22 (!) 366 lb (166 kg)  01/05/22 (!) 357 lb (161.9 kg)     GEN: Well nourished, well developed in no acute  distress NECK: No JVD; No carotid bruits CARDIAC: ***RRR, no murmurs, rubs, gallops RESPIRATORY:  Clear to auscultation without rales, wheezing or rhonchi  ABDOMEN: Soft, non-tender, non-distended EXTREMITIES:  No edema; No deformity   ASSESSMENT AND PLAN:   Cardiomegaly/shortness of breath: ***   CT of the chest in 04/2020 showed evidence of cardiomegaly.  Echo in 04/2020 showed no evidence of diastolic dysfunction, EF 60 to 65%, normal LV function, low normal RV function.  She notes increased shortness of breath, mild weight gain. She has also noticed a dry cough as well as some postnasal drip.  She has chronic bilateral lower extremity edema in the setting of lymphedema.  Otherwise, euvolemic and well compensated on exam.  She has generally been well-managed on torsemide.  Will increase torsemide to 60 mg daily to see if her symptoms improve.  Will check BMET in 2 weeks.  I suspect her shortness of breath is multifactorial in the setting of sedentary lifestyle, morbid obesity, possible underlying pulmonary issues (she was recently prescribed an inhaler per PCP), and mild fluid retention.  If symptoms persist despite increased diuretic therapy, consider repeat echocardiogram versus ischemic evaluation.  Continue carvedilol, spironolactone, torsemide.   Hypertension: ***  BP elevated above goal in office today, recently well controlled. She has not taken her BP meds today. Continue to monitor and report BP consistently > 130/80.  She did not tolerate ACE inhibitor or  thiazide diuretic in the past, did not tolerate amlodipine due to swelling.  Could consider trial of ARB if needed in the future.  For now, continue current antihypertensive regimen.    Lymphedema: ***  Stable, chronic. Following with vascular.    Suspected OSA/history of snoring: ***  Itamar pending.  She had technical difficulties that have delayed her study-these were addressed at today's visit.   Obesity:  *** She is rather sedentary.  Encouraged increased activity as tolerated.       {Are you ordering a CV Procedure (e.g. stress test, cath, DCCV, TEE, etc)?   Press F2        :[id number]}  Follow up ***  Signed, Rollene Rotunda, MD

## 2022-12-07 ENCOUNTER — Ambulatory Visit: Payer: Medicaid Other | Admitting: Cardiology

## 2022-12-07 DIAGNOSIS — I517 Cardiomegaly: Secondary | ICD-10-CM

## 2022-12-07 DIAGNOSIS — I10 Essential (primary) hypertension: Secondary | ICD-10-CM

## 2022-12-07 DIAGNOSIS — G4733 Obstructive sleep apnea (adult) (pediatric): Secondary | ICD-10-CM

## 2022-12-11 ENCOUNTER — Ambulatory Visit (INDEPENDENT_AMBULATORY_CARE_PROVIDER_SITE_OTHER): Payer: Medicaid Other | Admitting: Vascular Surgery

## 2022-12-28 ENCOUNTER — Other Ambulatory Visit: Payer: Self-pay | Admitting: Family Medicine

## 2022-12-28 DIAGNOSIS — F324 Major depressive disorder, single episode, in partial remission: Secondary | ICD-10-CM

## 2023-01-11 ENCOUNTER — Ambulatory Visit (INDEPENDENT_AMBULATORY_CARE_PROVIDER_SITE_OTHER): Payer: Medicaid Other | Admitting: Vascular Surgery

## 2023-01-17 ENCOUNTER — Ambulatory Visit: Payer: Medicaid Other | Admitting: Family Medicine

## 2023-01-18 ENCOUNTER — Encounter: Payer: Self-pay | Admitting: Family Medicine

## 2023-01-18 ENCOUNTER — Ambulatory Visit (INDEPENDENT_AMBULATORY_CARE_PROVIDER_SITE_OTHER): Payer: Medicare Other | Admitting: Family Medicine

## 2023-01-18 VITALS — BP 140/82 | HR 96 | Temp 97.5°F | Ht 65.0 in | Wt >= 6400 oz

## 2023-01-18 DIAGNOSIS — E039 Hypothyroidism, unspecified: Secondary | ICD-10-CM | POA: Diagnosis not present

## 2023-01-18 DIAGNOSIS — G629 Polyneuropathy, unspecified: Secondary | ICD-10-CM

## 2023-01-18 DIAGNOSIS — M25511 Pain in right shoulder: Secondary | ICD-10-CM

## 2023-01-18 DIAGNOSIS — R0982 Postnasal drip: Secondary | ICD-10-CM | POA: Diagnosis not present

## 2023-01-18 DIAGNOSIS — G4733 Obstructive sleep apnea (adult) (pediatric): Secondary | ICD-10-CM

## 2023-01-18 DIAGNOSIS — Z23 Encounter for immunization: Secondary | ICD-10-CM | POA: Diagnosis not present

## 2023-01-18 DIAGNOSIS — L03116 Cellulitis of left lower limb: Secondary | ICD-10-CM

## 2023-01-18 DIAGNOSIS — E538 Deficiency of other specified B group vitamins: Secondary | ICD-10-CM

## 2023-01-18 LAB — TSH: TSH: 5.8 u[IU]/mL — ABNORMAL HIGH (ref 0.35–5.50)

## 2023-01-18 MED ORDER — VITAMIN B-12 1000 MCG PO TABS: 1000.0000 ug | ORAL_TABLET | Freq: Every day | ORAL | 5 refills | Status: DC

## 2023-01-18 MED ORDER — MOMETASONE FUROATE 50 MCG/ACT NA SUSP
2.0000 | Freq: Every day | NASAL | 12 refills | Status: DC
Start: 2023-01-18 — End: 2023-03-23

## 2023-01-18 MED ORDER — LEVOTHYROXINE SODIUM 112 MCG PO TABS: 112.0000 ug | ORAL_TABLET | Freq: Every day | ORAL | 1 refills | Status: DC

## 2023-01-18 MED ORDER — GABAPENTIN 400 MG PO CAPS: 400.0000 mg | ORAL_CAPSULE | Freq: Two times a day (BID) | ORAL | 1 refills | Status: DC

## 2023-01-18 MED ORDER — CEPHALEXIN 500 MG PO CAPS: 500.0000 mg | ORAL_CAPSULE | Freq: Three times a day (TID) | ORAL | 0 refills | Status: AC

## 2023-01-18 NOTE — Progress Notes (Signed)
Established Patient Office Visit   Subjective:  Patient ID: Hannah Orr, female    DOB: 10-Mar-1975  Age: 47 y.o. MRN: [id number]  Chief Complaint  Patient presents with   Medical Management of Chronic Issues    6 month follow up.    Leg Pain    Left leg pain with edema and bleeding x 1 month.    Shoulder Pain    Right shoulder pain. Pt states she is not able to lift her arm up high.     Leg Pain  Pertinent negatives include no tingling.  Shoulder Pain  Pertinent negatives include no tingling.   Encounter Diagnoses  Name Primary?   OSA (obstructive sleep apnea) Yes   Post-nasal drip    Acquired hypothyroidism    Neuropathy    Need for immunization against influenza    Cellulitis of left lower extremity    Right shoulder pain, unspecified chronicity    B12 deficiency    For follow-up of above.  Gabapentin seems to be helping the neuropathic pain in her legs but she wonders if a higher dose could be more helpful.  She says that she has been taking her B12 but ran out a week ago.  Claims compliance with levothyroxine daily on a fasting stomach.  She has had a zone of erythema with intermittent bleeding left leg just below the knee.  Right shoulder pain for a few weeks without injury.  She wakes up gasping for air out of sleep.  She does snore.  Has not had a apnea evaluation.  Ongoing dry cough associated with postnasal drip.  There has been no fevers or chills.  No wheezing or difficulty breathing.   Review of Systems  Constitutional: Negative.   HENT: Negative.  Negative for congestion, sinus pain and sore throat.   Eyes:  Negative for blurred vision, discharge and redness.  Respiratory:  Positive for cough. Negative for sputum production, shortness of breath and wheezing.   Cardiovascular: Negative.   Gastrointestinal:  Negative for abdominal pain.  Genitourinary: Negative.   Musculoskeletal:  Positive for joint pain. Negative for myalgias.  Skin:  Positive for rash.   Neurological:  Negative for tingling, loss of consciousness and weakness.  Endo/Heme/Allergies:  Negative for polydipsia.     Current Outpatient Medications:    albuterol (VENTOLIN HFA) 108 (90 Base) MCG/ACT inhaler, Inhale 1-2 puffs into the lungs every 6 (six) hours as needed for wheezing or shortness of breath. Please instruct, Disp: 8 g, Rfl: 2   buPROPion (WELLBUTRIN XL) 150 MG 24 hr tablet, Take 1 tablet by mouth once daily, Disp: 30 tablet, Rfl: 0   carvedilol (COREG) 25 MG tablet, TAKE 1 TABLET BY MOUTH TWICE DAILY FOR BLOOD PRESSURE, Disp: 180 tablet, Rfl: 3   cephALEXin (KEFLEX) 500 MG capsule, Take 1 capsule (500 mg total) by mouth 3 (three) times daily for 10 days., Disp: 30 capsule, Rfl: 0   FLUoxetine (PROZAC) 40 MG capsule, Take 1 capsule (40 mg total) by mouth daily., Disp: 90 capsule, Rfl: 1   gabapentin (NEURONTIN) 400 MG capsule, Take 1 capsule (400 mg total) by mouth 2 (two) times daily., Disp: 180 capsule, Rfl: 1   spironolactone (ALDACTONE) 50 MG tablet, Take 1 tablet by mouth once daily, Disp: 90 tablet, Rfl: 0   Torsemide 60 MG TABS, Take 60 mg by mouth daily., Disp: 90 tablet, Rfl: 3   Vitamin D, Ergocalciferol, (DRISDOL) 1.25 MG (50000 UNIT) CAPS capsule, Take 1 capsule (50,000 Units  total) by mouth every 7 (seven) days., Disp: 15 capsule, Rfl: 1   cyanocobalamin (VITAMIN B12) 1000 MCG tablet, Take 1 tablet (1,000 mcg total) by mouth daily., Disp: 90 tablet, Rfl: 5   levothyroxine (SYNTHROID) 112 MCG tablet, Take 1 tablet (112 mcg total) by mouth daily before breakfast., Disp: 90 tablet, Rfl: 1   mometasone (NASONEX) 50 MCG/ACT nasal spray, Place 2 sprays into the nose daily., Disp: 1 each, Rfl: 12   Objective:     BP (!) 140/82   Pulse 96   Temp (!) 97.5 F (36.4 C)   Ht 5\' 5"  (1.651 m)   Wt (!) 401 lb 6.4 oz (182.1 kg)   BMI 66.80 kg/m  BP Readings from Last 3 Encounters:  01/18/23 (!) 140/82  07/17/22 (!) 206/116  06/19/22 (!) 174/86   Wt Readings  from Last 3 Encounters:  01/18/23 (!) 401 lb 6.4 oz (182.1 kg)  07/17/22 (!) 389 lb (176.4 kg)  03/29/22 (!) 366 lb (166 kg)      Physical Exam Constitutional:      General: She is not in acute distress.    Appearance: Normal appearance. She is obese. She is not ill-appearing, toxic-appearing or diaphoretic.  HENT:     Head: Normocephalic and atraumatic.     Right Ear: External ear normal.     Left Ear: External ear normal.     Mouth/Throat:     Mouth: Mucous membranes are moist.     Pharynx: Oropharynx is clear. No oropharyngeal exudate or posterior oropharyngeal erythema.  Eyes:     General: No scleral icterus.       Right eye: No discharge.        Left eye: No discharge.     Extraocular Movements: Extraocular movements intact.     Conjunctiva/sclera: Conjunctivae normal.     Pupils: Pupils are equal, round, and reactive to light.  Cardiovascular:     Rate and Rhythm: Normal rate and regular rhythm.  Pulmonary:     Effort: Pulmonary effort is normal. No respiratory distress.     Breath sounds: No wheezing, rhonchi or rales.  Abdominal:     General: Bowel sounds are normal.  Skin:    General: Skin is warm and dry.       Neurological:     Mental Status: She is alert and oriented to person, place, and time.  Psychiatric:        Mood and Affect: Mood normal.        Behavior: Behavior normal.      No results found for any visits on 01/18/23.    The 10-year ASCVD risk score (Arnett DK, et al., 2019) is: 2.6%    Assessment & Plan:   OSA (obstructive sleep apnea) -     Ambulatory referral to Pulmonology  Post-nasal drip -     Mometasone Furoate; Place 2 sprays into the nose daily.  Dispense: 1 each; Refill: 12  Acquired hypothyroidism -     Levothyroxine Sodium; Take 1 tablet (112 mcg total) by mouth daily before breakfast.  Dispense: 90 tablet; Refill: 1 -     TSH  Neuropathy -     Gabapentin; Take 1 capsule (400 mg total) by mouth 2 (two) times daily.   Dispense: 180 capsule; Refill: 1 -     Vitamin B-12; Take 1 tablet (1,000 mcg total) by mouth daily.  Dispense: 90 tablet; Refill: 5  Need for immunization against influenza -     Flu vaccine  trivalent PF, 6mos and older(Flulaval,Afluria,Fluarix,Fluzone)  Cellulitis of left lower extremity -     Cephalexin; Take 1 capsule (500 mg total) by mouth 3 (three) times daily for 10 days.  Dispense: 30 capsule; Refill: 0  Right shoulder pain, unspecified chronicity -     Ambulatory referral to Orthopedic Surgery  B12 deficiency -     Vitamin B-12; Take 1 tablet (1,000 mcg total) by mouth daily.  Dispense: 90 tablet; Refill: 5    Return in about 6 months (around 07/18/2023), or if symptoms worsen or fail to improve.  Advised elevation of her legs as possible.  Will start Keflex 3 times daily.  Will return if not improving.  Expressed the importance of taking her B12 tablets as directed.  Mliss Sax, MD

## 2023-01-21 NOTE — Progress Notes (Deleted)
Cardiology Office Note:   Date:  01/21/2023  ID:  Hannah Orr, DOB 09/26/1975, MRN [id number] PCP: Hannah Sax, MD  [facility] Providers Cardiologist:  Rollene Rotunda, MD {  History of Present Illness:   Hannah Orr is a 47 y.o. female with the a history of cardiomegaly, lymphedema, hypertension  and OSA.   She has had problems with chronic bilateral lower extremity edema, elevated BP.  She was hospitalized in 04/2020 for with suspected acute diastolic heart failure, echocardiogram at the time showed no evidence of diastolic dysfunction, essentially normal.  CT of the chest did show cardiomegaly.          ****She was last seen in the office on 09/02/2021, stable overall from a cardiac standpoint.  BP was well controlled, lower extremity edema was stable.  She was referred for home sleep study. She was since evaluated by vascular surgery.  ABIs in 11/2021 showed noncompressible bilateral lower extremity arteries, duplex with reflux was negative for DVT some mild venous reflux.  It was thought that patient likely had lymphedema.  Pneumatic compression was recommended.   She presents today for follow-up. Since her last visit she has been stable from a cardiac standpoint.  She does note a slight increase in shortness of breath with activity, mild weight gain.  She was prescribed an inhaler per PCP.  She notes some postnasal drip as well as a dry cough.  She denies chest pain, worsening edema, PND, orthopnea    ROS: ***  Studies Reviewed:    EKG:       ***  Risk Assessment/Calculations:   {Does this patient have ATRIAL FIBRILLATION?:[phone]} No BP recorded.  {Refresh Note OR Click here to enter BP  :1}***        Physical Exam:   VS:  There were no vitals taken for this visit.   Wt Readings from Last 3 Encounters:  01/18/23 (!) 401 lb 6.4 oz (182.1 kg)  07/17/22 (!) 389 lb (176.4 kg)  03/29/22 (!) 366 lb (166 kg)     GEN: Well nourished, well  developed in no acute distress NECK: No JVD; No carotid bruits CARDIAC: ***RR, *** murmurs, rubs, gallops RESPIRATORY:  Clear to auscultation without rales, wheezing or rhonchi  ABDOMEN: Soft, non-tender, non-distended EXTREMITIES:  No edema; No deformity   ASSESSMENT AND PLAN:   Cardiomegaly/shortness of breath:   ***   CT of the chest in 04/2020 showed evidence of cardiomegaly.  Echo in 04/2020 showed no evidence of diastolic dysfunction, EF 60 to 65%, normal LV function, low normal RV function.  She notes increased shortness of breath, mild weight gain. She has also noticed a dry cough as well as some postnasal drip.  She has chronic bilateral lower extremity edema in the setting of lymphedema.  Otherwise, euvolemic and well compensated on exam.  She has generally been well-managed on torsemide.  Will increase torsemide to 60 mg daily to see if her symptoms improve.  Will check BMET in 2 weeks.  I suspect her shortness of breath is multifactorial in the setting of sedentary lifestyle, morbid obesity, possible underlying pulmonary issues (she was recently prescribed an inhaler per PCP), and mild fluid retention.  If symptoms persist despite increased diuretic therapy, consider repeat echocardiogram versus ischemic evaluation.  Continue carvedilol, spironolactone, torsemide.   Hypertension:   ***   BP elevated above goal in office today, recently well controlled. She has not taken her BP meds today. Continue to monitor and report  BP consistently > 130/80.  She did not tolerate ACE inhibitor or thiazide diuretic in the past, did not tolerate amlodipine due to swelling.  Could consider trial of ARB if needed in the future.  For now, continue current antihypertensive regimen.    Lymphedema:   ***    Stable, chronic. Following with vascular.    Suspected OSA.   She was to have a sleep study but she did not have it.   The order expired.   ***   history of snoring: Itamar pending.  She had technical  difficulties that have delayed her study-these were addressed at today's visit.   Obesity:  ***  he is rather sedentary. Encouraged increased activity as tolerated.        Follow up ***  Signed, Rollene Rotunda, MD

## 2023-01-22 ENCOUNTER — Ambulatory Visit: Payer: Medicare Other | Admitting: Cardiology

## 2023-01-22 DIAGNOSIS — R0602 Shortness of breath: Secondary | ICD-10-CM

## 2023-01-22 DIAGNOSIS — I429 Cardiomyopathy, unspecified: Secondary | ICD-10-CM

## 2023-01-22 DIAGNOSIS — I890 Lymphedema, not elsewhere classified: Secondary | ICD-10-CM

## 2023-01-24 ENCOUNTER — Other Ambulatory Visit: Payer: Self-pay | Admitting: Family Medicine

## 2023-01-24 DIAGNOSIS — L03116 Cellulitis of left lower limb: Secondary | ICD-10-CM

## 2023-01-29 ENCOUNTER — Other Ambulatory Visit: Payer: Self-pay | Admitting: Family Medicine

## 2023-01-29 ENCOUNTER — Telehealth: Payer: Self-pay | Admitting: Family Medicine

## 2023-01-29 DIAGNOSIS — L03116 Cellulitis of left lower limb: Secondary | ICD-10-CM

## 2023-01-29 NOTE — Telephone Encounter (Signed)
Pt called to say the pharmacy is requesting for Prior Authorization for the medication Cethalexin to be sent to them before medication can be given.  Prior Auth to be sent to:  [id number] - [place], [place] - [address]

## 2023-01-30 NOTE — Telephone Encounter (Signed)
Called pt, no response and voicemail is full

## 2023-01-30 NOTE — Telephone Encounter (Signed)
Pt called was given Dr Evangeline Gula message and she understood will be calling back to make appt when she gets her schedule.

## 2023-02-07 ENCOUNTER — Other Ambulatory Visit: Payer: Self-pay | Admitting: Family Medicine

## 2023-02-07 ENCOUNTER — Other Ambulatory Visit: Payer: Self-pay | Admitting: Cardiology

## 2023-02-07 DIAGNOSIS — F324 Major depressive disorder, single episode, in partial remission: Secondary | ICD-10-CM

## 2023-02-09 ENCOUNTER — Encounter: Payer: Self-pay | Admitting: *Deleted

## 2023-02-11 NOTE — Progress Notes (Unsigned)
MRN : [id number]  Hannah Orr is a 47 y.o. ([birth year]) female who presents with chief complaint of legs swell.  History of Present Illness:   The patient returns to the office for followup evaluation regarding leg swelling.  The swelling has persisted and the pain associated with swelling continues. There have not been any interval development of a ulcerations or wounds.  Since the previous visit the patient has been wearing graduated compression stockings and has noted little if any improvement in the lymphedema. The patient has been using compression routinely morning until night.  The patient also states elevation during the day and exercise is being done too.  No outpatient medications have been marked as taking for the 02/12/23 encounter (Appointment) with Gilda Crease, Latina Craver, MD.    Past Medical History:  Diagnosis Date   Depression    Hypertension     Past Surgical History:  Procedure Laterality Date   MANDIBLE SURGERY Right 12/11/1993    Social History Social History   Tobacco Use   Smoking status: Never   Smokeless tobacco: Never  Vaping Use   Vaping status: Never Used  Substance Use Topics   Alcohol use: Not Currently    Comment: Stopped drinking December 2021.    Drug use: No    Family History Family History  Problem Relation Age of Onset   Hypertension Mother    Breast cancer Mother    Hypertension Father    Prostate cancer Father    Cancer Neg Hx    Heart disease Neg Hx    Hyperlipidemia Neg Hx    Kidney disease Neg Hx    Diabetes Neg Hx    Early death Neg Hx    Hearing loss Neg Hx    Stroke Neg Hx     Allergies  Allergen Reactions   Benazepril Hcl Hypertension    cough   Benazepril Hcl     cough     REVIEW OF SYSTEMS (Negative unless checked)  Constitutional: [] Weight loss  [] Fever  [] Chills Cardiac: [] Chest pain   [] Chest pressure   [] Palpitations   [] Shortness of breath when laying flat    [] Shortness of breath with exertion. Vascular:  [] Pain in legs with walking   [x] Pain in legs with standing  [] History of DVT   [] Phlebitis   [x] Swelling in legs   [] Varicose veins   [] Non-healing ulcers Pulmonary:   [] Uses home oxygen   [] Productive cough   [] Hemoptysis   [] Wheeze  [] COPD   [] Asthma Neurologic:  [] Dizziness   [] Seizures   [] History of stroke   [] History of TIA  [] Aphasia   [] Vissual changes   [] Weakness or numbness in arm   [] Weakness or numbness in leg Musculoskeletal:   [] Joint swelling   [] Joint pain   [] Low back pain Hematologic:  [] Easy bruising  [] Easy bleeding   [] Hypercoagulable state   [] Anemic Gastrointestinal:  [] Diarrhea   [] Vomiting  [x] Gastroesophageal reflux/heartburn   [] Difficulty swallowing. Genitourinary:  [] Chronic kidney disease   [] Difficult urination  [] Frequent urination   [] Blood in urine Skin:  [] Rashes   [] Ulcers  Psychological:  [] History of anxiety   []  History of major depression.  Physical Examination  There were no vitals filed for this visit. There is no height or weight on file to calculate BMI. Gen: WD/WN, NAD Head: [place]/AT, No  temporalis wasting.  Ear/Nose/Throat: Hearing grossly intact, nares w/o erythema or drainage, pinna without lesions Eyes: PER, EOMI, sclera nonicteric.  Neck: Supple, no gross masses.  No JVD.  Pulmonary:  Good air movement, no audible wheezing, no use of accessory muscles.  Cardiac: RRR, precordium not hyperdynamic. Vascular:  scattered varicosities present bilaterally.  Mild venous stasis changes to the legs bilaterally.  3-4+ soft pitting edema, CEAP [id number]  Vessel Right Left  Radial Palpable Palpable  Gastrointestinal: soft, non-distended. No guarding/no peritoneal signs.  Musculoskeletal: M/S 5/5 throughout.  No deformity.  Neurologic: CN 2-12 intact. Pain and light touch intact in extremities.  Symmetrical.  Speech is fluent. Motor exam as listed above. Psychiatric: Judgment intact, Mood & affect  appropriate for pt's clinical situation. Dermatologic: Venous rashes no ulcers noted.  No changes consistent with cellulitis. Lymph : No lichenification or skin changes of chronic lymphedema.  CBC Lab Results  Component Value Date   WBC 2.7 (L) 07/17/2022   HGB 12.4 07/17/2022   HCT 37.3 07/17/2022   MCV 113.3 Repeated and verified X2. (H) 07/17/2022   PLT 293.0 07/17/2022    BMET    Component Value Date/Time   NA 133 (L) 05/09/2021 1152   NA 134 03/25/2021 1412   K 3.9 05/09/2021 1152   CL 96 05/09/2021 1152   CO2 22 05/09/2021 1152   GLUCOSE 95 05/09/2021 1152   BUN 10 05/09/2021 1152   BUN 9 03/25/2021 1412   CREATININE 0.79 05/09/2021 1152   CREATININE 0.75 07/28/2020 1205   CALCIUM 9.5 05/09/2021 1152   GFRNONAA >60 07/28/2020 1205   CrCl cannot be calculated (Patient's most recent lab result is older than the maximum 21 days allowed.).  COAG No results found for: "INR", "PROTIME"  Radiology No results found.   Assessment/Plan There are no diagnoses linked to this encounter.   Levora Dredge, MD  02/11/2023 4:00 PM

## 2023-02-12 ENCOUNTER — Ambulatory Visit (INDEPENDENT_AMBULATORY_CARE_PROVIDER_SITE_OTHER): Payer: Medicare Other | Admitting: Vascular Surgery

## 2023-02-12 ENCOUNTER — Encounter (INDEPENDENT_AMBULATORY_CARE_PROVIDER_SITE_OTHER): Payer: Self-pay | Admitting: Vascular Surgery

## 2023-02-12 VITALS — BP 204/107 | HR 59 | Resp 20 | Ht 65.0 in | Wt >= 6400 oz

## 2023-02-12 DIAGNOSIS — I10 Essential (primary) hypertension: Secondary | ICD-10-CM | POA: Diagnosis not present

## 2023-02-12 DIAGNOSIS — I872 Venous insufficiency (chronic) (peripheral): Secondary | ICD-10-CM

## 2023-02-12 DIAGNOSIS — K219 Gastro-esophageal reflux disease without esophagitis: Secondary | ICD-10-CM

## 2023-02-12 DIAGNOSIS — I890 Lymphedema, not elsewhere classified: Secondary | ICD-10-CM | POA: Diagnosis not present

## 2023-02-12 DIAGNOSIS — L03116 Cellulitis of left lower limb: Secondary | ICD-10-CM

## 2023-02-12 DIAGNOSIS — L0390 Cellulitis, unspecified: Secondary | ICD-10-CM | POA: Insufficient documentation

## 2023-02-12 DIAGNOSIS — J4521 Mild intermittent asthma with (acute) exacerbation: Secondary | ICD-10-CM | POA: Diagnosis not present

## 2023-02-12 MED ORDER — DOXYCYCLINE HYCLATE 100 MG PO CAPS: 100.0000 mg | ORAL_CAPSULE | Freq: Two times a day (BID) | ORAL | 0 refills | Status: DC

## 2023-02-13 ENCOUNTER — Other Ambulatory Visit (INDEPENDENT_AMBULATORY_CARE_PROVIDER_SITE_OTHER): Payer: Self-pay | Admitting: Nurse Practitioner

## 2023-02-13 ENCOUNTER — Telehealth (INDEPENDENT_AMBULATORY_CARE_PROVIDER_SITE_OTHER): Payer: Self-pay

## 2023-02-13 DIAGNOSIS — I872 Venous insufficiency (chronic) (peripheral): Secondary | ICD-10-CM

## 2023-02-13 NOTE — Telephone Encounter (Signed)
Patient has been referred to [facility] for weekly bilateral unna wraps. Home health will start next week. Patient has been notified.

## 2023-02-16 NOTE — Progress Notes (Signed)
Per Dr. Antoine Poche:  As patient is already rescheduled for an OV on 03/23/23, will give her one more opportunity to be seen before dismissing.

## 2023-03-08 ENCOUNTER — Other Ambulatory Visit: Payer: Self-pay | Admitting: Cardiology

## 2023-03-12 ENCOUNTER — Other Ambulatory Visit: Payer: Self-pay | Admitting: Family Medicine

## 2023-03-12 DIAGNOSIS — F324 Major depressive disorder, single episode, in partial remission: Secondary | ICD-10-CM

## 2023-03-19 ENCOUNTER — Telehealth: Payer: Self-pay | Admitting: *Deleted

## 2023-03-19 NOTE — Telephone Encounter (Signed)
Spoke with patient to confirm she is aware of upcoming appointment with Dr. Antoine Poche on 03/23/23 at 3:20pm. Advised that since her last few appointments with Dr. Antoine Poche were cancelled, it is important that she keep this appointment. Pt confirmed she is aware and plans to keep this appointment. She verbalized appreciation of call.

## 2023-03-22 NOTE — Progress Notes (Signed)
Cardiology Office Note:   Date:  03/23/2023  ID:  BRYANNAH Orr, DOB 11-Feb-1976, MRN [id number] PCP: Mliss Sax, MD  [facility] Providers Cardiologist:  Rollene Rotunda, MD {  History of Present Illness:   Hannah Orr is a 48 y.o. female who was referred by Mliss Sax, MD for evaluation of edema and difficult to control HTN.   She was in the hospital in March 2022 for this.  She had IV diuresis.  I did review these records for this appointment.  An echocardiogram was basically unremarkable.  She was taken off of amlodipine because of the swelling and started on carvedilol.  She was to have a sleep study home test but she canceled this.  She has been followed in the Pharm D HTN clinic.  She was hospitalized in 04/2020 for with suspected acute diastolic heart failure, echocardiogram at the time showed no evidence of diastolic dysfunction, essentially normal.  CT of the chest did show cardiomegaly.  She was last seen in the office on 09/02/2021, stable overall from a cardiac standpoint.  BP was well controlled, lower extremity edema was stable.  She was referred for home sleep study. She was since evaluated by vascular surgery.  ABIs in 11/2021 showed noncompressible bilateral lower extremity arteries, duplex with reflux was negative for DVT some mild venous reflux.  It was thought that patient likely had lymphedema.  Pneumatic compression was recommended.  Since I last saw her she has had no new cardiovascular complaints.  She sees [facility] and has a home health nurse who comes out and wraps her legs once a week.  She denies any new cardiovascular complaints.  She has had no chest pressure, neck or arm discomfort.  She has had no new shortness of breath, PND or orthopnea.  She sleeps chronically in a chair.  Straight   ROS: As stated in the HPI and negative for all other systems.\  Studies Reviewed:    EKG:   EKG  Interpretation Date/Time:  Friday March 23 2023 15:19:32 EST Ventricular Rate:  86 PR Interval:  174 QRS Duration:  84 QT Interval:  368 QTC Calculation: 440 R Axis:   55  Text Interpretation: Normal sinus rhythm Normal ECG When compared with ECG of 12-May-2020 22:59, No significant change since last tracing Confirmed by Rollene Rotunda ([id number]) on 03/23/2023 3:47:03 PM    Risk Assessment/Calculations:     Physical Exam:   VS:  BP (!) 154/90   Pulse 83   Ht 5\' 5"  (1.651 m)   Wt (!) 392 lb (177.8 kg)   SpO2 93%   BMI 65.23 kg/m    Wt Readings from Last 3 Encounters:  03/23/23 (!) 392 lb (177.8 kg)  02/12/23 (!) 401 lb (181.9 kg)  01/18/23 (!) 401 lb 6.4 oz (182.1 kg)     GEN: Well nourished, well developed in no acute distress NECK: No JVD; No carotid bruits CARDIAC: RRR, no murmurs, rubs, gallops RESPIRATORY:  Clear to auscultation without rales, wheezing or rhonchi  ABDOMEN: Soft, non-tender, non-distended EXTREMITIES:  Severe bilateral lymphedema  ASSESSMENT AND PLAN:   Cardiomegaly/shortness of breath: The patient's had a previous extensive workup and actually is not really complaining of shortness of breath at this point.  No change in therapy.  No further imaging is indicated.    Hypertension: Her blood pressure is elevated but this is unusual.  She will keep a blood pressure diary.  She has been somewhat intolerant  of some of her medications and has not tolerated HCTZ ACE inhibitor or amlodipine in the past.  No change in therapy.  Lymphedema: She is having this followed closely.  No change in therapy.  Suspected OSA/history of snoring: STOP-BANG is 5.  She was supposed to have Itamar.  I will reorder this.         Follow up with me in one year.   Signed, Rollene Rotunda, MD

## 2023-03-23 ENCOUNTER — Encounter: Payer: Self-pay | Admitting: Cardiology

## 2023-03-23 ENCOUNTER — Ambulatory Visit: Payer: Medicare Other | Attending: Cardiology | Admitting: Cardiology

## 2023-03-23 VITALS — BP 154/90 | HR 83 | Ht 65.0 in | Wt 392.0 lb

## 2023-03-23 DIAGNOSIS — I10 Essential (primary) hypertension: Secondary | ICD-10-CM

## 2023-03-23 DIAGNOSIS — R0602 Shortness of breath: Secondary | ICD-10-CM

## 2023-03-23 DIAGNOSIS — I890 Lymphedema, not elsewhere classified: Secondary | ICD-10-CM

## 2023-03-23 DIAGNOSIS — G4733 Obstructive sleep apnea (adult) (pediatric): Secondary | ICD-10-CM

## 2023-03-23 NOTE — Patient Instructions (Signed)
Medication Instructions:  No changes.  *If you need a refill on your cardiac medications before your next appointment, please call your pharmacy*   Testing/Procedures: WatchPAT?  Is a FDA cleared portable home sleep study test that uses a watch and 3 points of contact to monitor 7 different channels, including your heart rate, oxygen saturations, body position, snoring, and chest motion.  The study is easy to use from the comfort of your own home and accurately detect sleep apnea.  Before bed, you attach the chest sensor, attached the sleep apnea bracelet to your nondominant hand, and attach the finger probe.  After the study, the raw data is downloaded from the watch and scored for apnea events.   For more information: [email or web address]  Patient Testing Instructions:  Do not put battery into the device until bedtime when you are ready to begin the test. Please call the support number if you need assistance after following the instructions below: 24 hour support line- [phone] or [facility] support at [phone] (option 2)  Download the IntelWatchPAT One" app through the google play store or App Store  Be sure to turn on or enable access to bluetooth in settlings on your smartphone/ device  Make sure no other bluetooth devices are on and within the vicinity of your smartphone/ device and WatchPAT watch during testing.  Make sure to leave your smart phone/ device plugged in and charging all night.  When ready for bed:  Follow the instructions step by step in the WatchPAT One App to activate the testing device. For additional instructions, including video instruction, visit the WatchPAT One video on Youtube. You can search for WatchPat One within Youtube (video is 4 minutes and 18 seconds) or enter: https://youtube/watch?[email or web address]_vbiwxE Please note: You will be prompted to enter a Pin to connect via bluetooth when starting the test. The PIN will be assigned to you when you  receive the test.  The device is disposable, but it recommended that you retain the device until you receive a call letting you know the study has been received and the results have been interpreted.  We will let you know if the study did not transmit to [place] properly after the test is completed. You do not need to call us to confirm the receipt of the test.  Please complete the test within 48 hours of receiving PIN.   Frequently Asked Questions:  What is Watch Dennie Bible one?  A single use fully disposable home sleep apnea testing device and will not need to be returned after completion.  What are the requirements to use WatchPAT one?  The be able to have a successful watchpat one sleep study, you should have your Watch pat one device, your smart phone, watch pat one app, your PIN number and Internet access What type of phone do I need?  You should have a smart phone that uses Android 5.1 and above or any Iphone with IOS 10 and above How can I download the WatchPAT one app?  Based on your device type search for WatchPAT one app either in google play for android devices or APP store for Iphone's Where will I get my PIN for the study?  Your PIN will be provided by your physician's office. It is used for authentication and if you lose/forget your PIN, please reach out to your providers office.  I do not have Internet at home. Can I do WatchPAT one study?  WatchPAT One needs Internet connection throughout the night to be able  to transmit the sleep data. You can use your home/local internet or your cellular's data package. However, it is always recommended to use home/local Internet. It is estimated that between 20MB-30MB will be used with each study.However, the application will be looking for space in the phone to start the study.  What happens if I lose internet or bluetooth connection?  During the internet disconnection, your phone will not be able to transmit the sleep data. All the data, will be  stored in your phone. As soon as the internet connection is back on, the phone will being sending the sleep data. During the bluetooth disconnection, WatchPAT one will not be able to to send the sleep data to your phone. Data will be kept in the [facility] one until two devices have bluetooth connection back on. As soon as the connection is back on, WatchPAT one will send the sleep data to the phone.  How long do I need to wear the WatchPAT one?  After you start the study, you should wear the device at least 6 hours.  How far should I keep my phone from the device?  During the night, your phone should be within 15 feet.  What happens if I leave the room for restroom or other reasons?  Leaving the room for any reason will not cause any problem. As soon as your get back to the room, both devices will reconnect and will continue to send the sleep data. Can I use my phone during the sleep study?  Yes, you can use your phone as usual during the study. But it is recommended to put your watchpat one on when you are ready to go to bed.  How will I get my study results?  A soon as you completed your study, your sleep data will be sent to the provider. They will then share the results with you when they are ready.      Follow-Up: At [facility], you and your health needs are our priority.  As part of our continuing mission to provide you with exceptional heart care, we have created designated Provider Care Teams.  These Care Teams include your primary Cardiologist (physician) and Advanced Practice Providers (APPs -  Physician Assistants and Nurse Practitioners) who all work together to provide you with the care you need, when you need it.  We recommend signing up for the patient portal called "MyChart".  Sign up information is provided on this After Visit Summary.  MyChart is used to connect with patients for Virtual Visits (Telemedicine).  Patients are able to view lab/test results, encounter notes,  upcoming appointments, etc.  Non-urgent messages can be sent to your provider as well.   To learn more about what you can do with MyChart, go to [email or web address].    Your next appointment:   1 year  Provider:   Rollene Rotunda, MD

## 2023-03-25 DIAGNOSIS — I83009 Varicose veins of unspecified lower extremity with ulcer of unspecified site: Secondary | ICD-10-CM | POA: Insufficient documentation

## 2023-03-25 DIAGNOSIS — L97909 Non-pressure chronic ulcer of unspecified part of unspecified lower leg with unspecified severity: Secondary | ICD-10-CM | POA: Insufficient documentation

## 2023-03-25 NOTE — Progress Notes (Unsigned)
MRN : [id number]  Hannah Orr is a 48 y.o. (1975/11/12) female who presents with chief complaint of legs hurt and swell.  History of Present Illness:   Patient is seen for follow up evaluation of leg pain and swelling associated with venous ulceration. The patient was recently seen here and started on Unna boot therapy.  The swelling abruptly became much worse bilaterally and is associated with pain and discoloration. The pain and swelling worsens with prolonged dependency and improves with elevation.  The patient notes that in the morning the legs are better but the leg symptoms worsened throughout the course of the day. The patient has also noted a progressive worsening of the discoloration in the ankle and shin area.   The patient notes that an ulcer has developed acutely without specific trauma and since it occurred it has been very slow to heal.  There is a moderate amount of drainage associated with the open area.  The wound is also painful.  The patient states that they have been elevating as much as possible. The patient denies any recent changes in medications.  The patient denies a history of DVT or PE. There is no prior history of phlebitis. There is no history of primary lymphedema.  No SOB or increased cough.  No sputum production.  No recent episodes of CHF exacerbation.  No outpatient medications have been marked as taking for the 03/26/23 encounter (Appointment) with Gilda Crease, Latina Craver, MD.    Past Medical History:  Diagnosis Date   Depression    Hypertension     Past Surgical History:  Procedure Laterality Date   MANDIBLE SURGERY Right 12/11/1993    Social History Social History   Tobacco Use   Smoking status: Never   Smokeless tobacco: Never  Vaping Use   Vaping status: Never Used  Substance Use Topics   Alcohol use: Not Currently    Comment: Stopped drinking December 2021.    Drug use: No    Family History Family History  Problem  Relation Age of Onset   Hypertension Mother    Breast cancer Mother    Hypertension Father    Prostate cancer Father    Cancer Neg Hx    Heart disease Neg Hx    Hyperlipidemia Neg Hx    Kidney disease Neg Hx    Diabetes Neg Hx    Early death Neg Hx    Hearing loss Neg Hx    Stroke Neg Hx     Allergies  Allergen Reactions   Benazepril Hcl Hypertension    cough   Benazepril Hcl     cough     REVIEW OF SYSTEMS (Negative unless checked)  Constitutional: [] Weight loss  [] Fever  [] Chills Cardiac: [] Chest pain   [] Chest pressure   [] Palpitations   [] Shortness of breath when laying flat   [] Shortness of breath with exertion. Vascular:  [] Pain in legs with walking   [x] Pain in legs at rest  [] History of DVT   [] Phlebitis   [x] Swelling in legs   [] Varicose veins   [] Non-healing ulcers Pulmonary:   [] Uses home oxygen   [] Productive cough   [] Hemoptysis   [] Wheeze  [] COPD   [] Asthma Neurologic:  [] Dizziness   [] Seizures   [] History of stroke   [] History of TIA  [] Aphasia   [] Vissual changes   [] Weakness or numbness in arm   [] Weakness or numbness in leg Musculoskeletal:   [] Joint swelling   [] Joint pain   []   Low back pain Hematologic:  [] Easy bruising  [] Easy bleeding   [] Hypercoagulable state   [] Anemic Gastrointestinal:  [] Diarrhea   [] Vomiting  [] Gastroesophageal reflux/heartburn   [] Difficulty swallowing. Genitourinary:  [] Chronic kidney disease   [] Difficult urination  [] Frequent urination   [] Blood in urine Skin:  [] Rashes   [] Ulcers  Psychological:  [] History of anxiety   []  History of major depression.  Physical Examination  There were no vitals filed for this visit. There is no height or weight on file to calculate BMI. Gen: WD/WN, NAD Head: [place]/AT, No temporalis wasting.  Ear/Nose/Throat: Hearing grossly intact, nares w/o erythema or drainage, pinna without lesions Eyes: PER, EOMI, sclera nonicteric.  Neck: Supple, no gross masses.  No JVD.  Pulmonary:  Good air movement,  no audible wheezing, no use of accessory muscles.  Cardiac: RRR, precordium not hyperdynamic. Vascular:  scattered varicosities present bilaterally.  Moderate venous stasis changes to the legs bilaterally.  2+ soft pitting edema. CEAP C4sEpAsPr   Vessel Right Left  Radial Palpable Palpable  Gastrointestinal: soft, non-distended. No guarding/no peritoneal signs.  Musculoskeletal: M/S 5/5 throughout.  No deformity.  Neurologic: CN 2-12 intact. Pain and light touch intact in extremities.  Symmetrical.  Speech is fluent. Motor exam as listed above. Psychiatric: Judgment intact, Mood & affect appropriate for pt's clinical situation. Dermatologic: Venous rashes no ulcers noted.  No changes consistent with cellulitis. Lymph : No lichenification or skin changes of chronic lymphedema.  CBC Lab Results  Component Value Date   WBC 2.7 (L) 07/17/2022   HGB 12.4 07/17/2022   HCT 37.3 07/17/2022   MCV 113.3 Repeated and verified X2. (H) 07/17/2022   PLT 293.0 07/17/2022    BMET    Component Value Date/Time   NA 133 (L) 05/09/2021 1152   NA 134 03/25/2021 1412   K 3.9 05/09/2021 1152   CL 96 05/09/2021 1152   CO2 22 05/09/2021 1152   GLUCOSE 95 05/09/2021 1152   BUN 10 05/09/2021 1152   BUN 9 03/25/2021 1412   CREATININE 0.79 05/09/2021 1152   CREATININE 0.75 07/28/2020 1205   CALCIUM 9.5 05/09/2021 1152   GFRNONAA >60 07/28/2020 1205   CrCl cannot be calculated (Patient's most recent lab result is older than the maximum 21 days allowed.).  COAG No results found for: "INR", "PROTIME"  Radiology No results found.   Assessment/Plan There are no diagnoses linked to this encounter.   Levora Dredge, MD  03/25/2023 2:04 PM

## 2023-03-26 ENCOUNTER — Telehealth (INDEPENDENT_AMBULATORY_CARE_PROVIDER_SITE_OTHER): Payer: Self-pay

## 2023-03-26 ENCOUNTER — Ambulatory Visit (INDEPENDENT_AMBULATORY_CARE_PROVIDER_SITE_OTHER): Payer: Medicare Other | Admitting: Vascular Surgery

## 2023-03-26 ENCOUNTER — Encounter (INDEPENDENT_AMBULATORY_CARE_PROVIDER_SITE_OTHER): Payer: Self-pay | Admitting: Vascular Surgery

## 2023-03-26 VITALS — BP 176/95 | HR 83 | Resp 18 | Wt 380.0 lb

## 2023-03-26 DIAGNOSIS — K219 Gastro-esophageal reflux disease without esophagitis: Secondary | ICD-10-CM

## 2023-03-26 DIAGNOSIS — I83009 Varicose veins of unspecified lower extremity with ulcer of unspecified site: Secondary | ICD-10-CM

## 2023-03-26 DIAGNOSIS — I872 Venous insufficiency (chronic) (peripheral): Secondary | ICD-10-CM | POA: Diagnosis not present

## 2023-03-26 DIAGNOSIS — I10 Essential (primary) hypertension: Secondary | ICD-10-CM | POA: Diagnosis not present

## 2023-03-26 DIAGNOSIS — L97909 Non-pressure chronic ulcer of unspecified part of unspecified lower leg with unspecified severity: Secondary | ICD-10-CM

## 2023-03-26 DIAGNOSIS — I890 Lymphedema, not elsewhere classified: Secondary | ICD-10-CM | POA: Diagnosis not present

## 2023-03-26 NOTE — Telephone Encounter (Signed)
[facility] was notified that patient was seen today in office and bilateral unna wraps were put on. Home health nurse can return next for nursing visits.

## 2023-04-02 ENCOUNTER — Encounter (INDEPENDENT_AMBULATORY_CARE_PROVIDER_SITE_OTHER): Payer: Self-pay

## 2023-04-02 ENCOUNTER — Encounter (INDEPENDENT_AMBULATORY_CARE_PROVIDER_SITE_OTHER): Payer: Medicare Other

## 2023-04-09 ENCOUNTER — Encounter (INDEPENDENT_AMBULATORY_CARE_PROVIDER_SITE_OTHER): Payer: Medicare Other

## 2023-04-13 ENCOUNTER — Other Ambulatory Visit: Payer: Self-pay | Admitting: Family Medicine

## 2023-04-13 ENCOUNTER — Other Ambulatory Visit: Payer: Self-pay

## 2023-04-13 ENCOUNTER — Other Ambulatory Visit: Payer: Self-pay | Admitting: Cardiology

## 2023-04-13 DIAGNOSIS — F324 Major depressive disorder, single episode, in partial remission: Secondary | ICD-10-CM

## 2023-04-13 MED ORDER — SPIRONOLACTONE 50 MG PO TABS: 50.0000 mg | ORAL_TABLET | Freq: Every day | ORAL | 3 refills | Status: AC

## 2023-04-16 ENCOUNTER — Encounter (INDEPENDENT_AMBULATORY_CARE_PROVIDER_SITE_OTHER): Payer: Medicare Other

## 2023-04-21 NOTE — Progress Notes (Unsigned)
 MRN : [id number]  Hannah Orr is a 48 y.o. (March 08, 1975) female who presents with chief complaint of legs hurt and swell.  History of Present Illness:   Patient is seen for follow up evaluation of leg pain and swelling associated with venous ulceration. The patient was seen here and started on Unna boot therapy.  The swelling had abruptly become much worse bilaterally and is associated with pain and discoloration. The pain and swelling worsens with prolonged dependency and improves with elevation.  In particular she notes a increase in the proximal left calf that is increasingly tender and a place on the lateral right calf that itches intensely.  The patient notes that an ulcer has developed acutely in the right lower shin without specific trauma and since it occurred it has been very slow to heal.  There is a moderate amount of drainage associated with the open area.  The wound is also painful.  The patient states that they have been elevating as much as possible. The patient denies any recent changes in medications.  She is also been using her lymph pump as much as she can  The patient denies a history of DVT or PE. There is no prior history of phlebitis. There is no history of primary lymphedema.  No SOB or increased cough.  No sputum production.  No recent episodes of CHF exacerbation.  No outpatient medications have been marked as taking for the 04/23/23 encounter (Appointment) with Gilda Crease, Latina Craver, MD.    Past Medical History:  Diagnosis Date   Depression    Hypertension     Past Surgical History:  Procedure Laterality Date   MANDIBLE SURGERY Right 12/11/1993    Social History Social History   Tobacco Use   Smoking status: Never   Smokeless tobacco: Never  Vaping Use   Vaping status: Never Used  Substance Use Topics   Alcohol use: Not Currently    Comment: Stopped drinking December 2021.    Drug use: No    Family History Family History  Problem  Relation Age of Onset   Hypertension Mother    Breast cancer Mother    Hypertension Father    Prostate cancer Father    Cancer Neg Hx    Heart disease Neg Hx    Hyperlipidemia Neg Hx    Kidney disease Neg Hx    Diabetes Neg Hx    Early death Neg Hx    Hearing loss Neg Hx    Stroke Neg Hx     Allergies  Allergen Reactions   Benazepril Hcl Hypertension    cough   Benazepril Hcl     cough     REVIEW OF SYSTEMS (Negative unless checked)  Constitutional: [] Weight loss  [] Fever  [] Chills Cardiac: [] Chest pain   [] Chest pressure   [] Palpitations   [] Shortness of breath when laying flat   [] Shortness of breath with exertion. Vascular:  [] Pain in legs with walking   [x] Pain in legs at rest  [] History of DVT   [] Phlebitis   [x] Swelling in legs   [] Varicose veins   [] Non-healing ulcers Pulmonary:   [] Uses home oxygen   [] Productive cough   [] Hemoptysis   [] Wheeze  [] COPD   [x] Asthma Neurologic:  [] Dizziness   [] Seizures   [] History of stroke   [] History of TIA  [] Aphasia   [] Vissual changes   [] Weakness or numbness in arm   [] Weakness or numbness in leg Musculoskeletal:   [] Joint  swelling   [] Joint pain   [] Low back pain Hematologic:  [] Easy bruising  [] Easy bleeding   [] Hypercoagulable state   [] Anemic Gastrointestinal:  [] Diarrhea   [] Vomiting  [x] Gastroesophageal reflux/heartburn   [] Difficulty swallowing. Genitourinary:  [] Chronic kidney disease   [] Difficult urination  [] Frequent urination   [] Blood in urine Skin:  [] Rashes   [] Ulcers  Psychological:  [] History of anxiety   []  History of major depression.  Physical Examination  There were no vitals filed for this visit. There is no height or weight on file to calculate BMI. Gen: WD/WN, NAD Head: /AT, No temporalis wasting.  Ear/Nose/Throat: Hearing grossly intact, nares w/o erythema or drainage, pinna without lesions Eyes: PER, EOMI, sclera nonicteric.  Neck: Supple, no gross masses.  No JVD.  Pulmonary:  Good air movement,  no audible wheezing, no use of accessory muscles.  Cardiac: RRR, precordium not hyperdynamic. Vascular:  scattered varicosities present bilaterally.  Moderate venous stasis changes to the legs bilaterally.  4+ variable soft pitting edema associated with more firm nonpitting areas. CEAP C4sEpAsPr there is an open wound in the right lower shin which appears uninfected does not appear to be granulating well Vessel Right Left  Radial Palpable Palpable  Gastrointestinal: soft, non-distended. No guarding/no peritoneal signs.  Musculoskeletal: M/S 5/5 throughout.  No deformity.  Neurologic: CN 2-12 intact. Pain and light touch intact in extremities.  Symmetrical.  Speech is fluent. Motor exam as listed above. Psychiatric: Judgment intact, Mood & affect appropriate for pt's clinical situation. Dermatologic: Venous rashes + ulcers noted right shin/ankle.  No changes consistent with cellulitis. Lymph : No lichenification or skin changes of chronic lymphedema.  CBC Lab Results  Component Value Date   WBC 2.7 (L) 07/17/2022   HGB 12.4 07/17/2022   HCT 37.3 07/17/2022   MCV 113.3 Repeated and verified X2. (H) 07/17/2022   PLT 293.0 07/17/2022    BMET    Component Value Date/Time   NA 133 (L) 05/09/2021 1152   NA 134 03/25/2021 1412   K 3.9 05/09/2021 1152   CL 96 05/09/2021 1152   CO2 22 05/09/2021 1152   GLUCOSE 95 05/09/2021 1152   BUN 10 05/09/2021 1152   BUN 9 03/25/2021 1412   CREATININE 0.79 05/09/2021 1152   CREATININE 0.75 07/28/2020 1205   CALCIUM 9.5 05/09/2021 1152   GFRNONAA >60 07/28/2020 1205   CrCl cannot be calculated (Patient's most recent lab result is older than the maximum 21 days allowed.).  COAG No results found for: "INR", "PROTIME"  Radiology No results found.   Assessment/Plan 1. Venous ulcer (HCC) (Primary) No surgery or intervention at this point in time.    I have had a long discussion with the patient regarding venous insufficiency and why it  causes  symptoms, specifically venous ulceration. I have discussed with the patient the chronic skin changes that accompany venous insufficiency and the long term sequela such as infection and recurring  ulceration.  Patient will be placed in [employer] which will be changed weekly drainage permitting.  In addition, behavioral modification including several periods of elevation of the lower extremities during the day will be continued. Achieving a position with the ankles at heart level was stressed to the patient  The patient is instructed to begin routine exercise, especially walking on a daily basis  In the future the patient can be assessed for graduated compression stockings or wraps as well as a Lymph Pump once the ulcers are healed.  2. Venous insufficiency No surgery  or intervention at this point in time.    I have had a long discussion with the patient regarding venous insufficiency and why it  causes symptoms, specifically venous ulceration. I have discussed with the patient the chronic skin changes that accompany venous insufficiency and the long term sequela such as infection and recurring  ulceration.  Patient will be placed in [employer] which will be changed weekly drainage permitting.  In addition, behavioral modification including several periods of elevation of the lower extremities during the day will be continued. Achieving a position with the ankles at heart level was stressed to the patient  The patient is instructed to begin routine exercise, especially walking on a daily basis  In the future the patient can be assessed for graduated compression stockings or wraps as well as a Lymph Pump once the ulcers are healed.  3. Essential hypertension Continue antihypertensive medications as already ordered, these medications have been reviewed and there are no changes at this time.  4. Mild intermittent reactive airway disease with acute exacerbation Continue pulmonary medications and  aerosols as already ordered, these medications have been reviewed and there are no changes at this time.   5. Gastroesophageal reflux disease, unspecified whether esophagitis present Continue PPI as already ordered, this medication has been reviewed and there are no changes at this time.  Avoidence of caffeine and alcohol  Moderate elevation of the head of the bed     Levora Dredge, MD  04/21/2023 4:06 PM

## 2023-04-23 ENCOUNTER — Encounter (INDEPENDENT_AMBULATORY_CARE_PROVIDER_SITE_OTHER): Payer: Self-pay | Admitting: Vascular Surgery

## 2023-04-23 ENCOUNTER — Ambulatory Visit (INDEPENDENT_AMBULATORY_CARE_PROVIDER_SITE_OTHER): Payer: Medicare Other | Admitting: Vascular Surgery

## 2023-04-23 VITALS — BP 176/85 | HR 84 | Resp 18 | Wt 350.0 lb

## 2023-04-23 DIAGNOSIS — L97909 Non-pressure chronic ulcer of unspecified part of unspecified lower leg with unspecified severity: Secondary | ICD-10-CM

## 2023-04-23 DIAGNOSIS — I10 Essential (primary) hypertension: Secondary | ICD-10-CM

## 2023-04-23 DIAGNOSIS — I83029 Varicose veins of left lower extremity with ulcer of unspecified site: Secondary | ICD-10-CM | POA: Diagnosis not present

## 2023-04-23 DIAGNOSIS — K219 Gastro-esophageal reflux disease without esophagitis: Secondary | ICD-10-CM

## 2023-04-23 DIAGNOSIS — I872 Venous insufficiency (chronic) (peripheral): Secondary | ICD-10-CM | POA: Diagnosis not present

## 2023-04-23 DIAGNOSIS — I83009 Varicose veins of unspecified lower extremity with ulcer of unspecified site: Secondary | ICD-10-CM | POA: Diagnosis not present

## 2023-04-23 DIAGNOSIS — J4521 Mild intermittent asthma with (acute) exacerbation: Secondary | ICD-10-CM | POA: Diagnosis not present

## 2023-04-23 DIAGNOSIS — I83019 Varicose veins of right lower extremity with ulcer of unspecified site: Secondary | ICD-10-CM

## 2023-04-24 ENCOUNTER — Encounter (INDEPENDENT_AMBULATORY_CARE_PROVIDER_SITE_OTHER): Payer: Self-pay | Admitting: Vascular Surgery

## 2023-05-11 ENCOUNTER — Other Ambulatory Visit: Payer: Self-pay | Admitting: Family Medicine

## 2023-05-11 DIAGNOSIS — F324 Major depressive disorder, single episode, in partial remission: Secondary | ICD-10-CM

## 2023-05-21 ENCOUNTER — Encounter: Payer: Self-pay | Admitting: Family Medicine

## 2023-05-21 ENCOUNTER — Ambulatory Visit: Payer: Self-pay

## 2023-05-21 ENCOUNTER — Telehealth (INDEPENDENT_AMBULATORY_CARE_PROVIDER_SITE_OTHER): Admitting: Family Medicine

## 2023-05-21 ENCOUNTER — Encounter (INDEPENDENT_AMBULATORY_CARE_PROVIDER_SITE_OTHER): Payer: Self-pay

## 2023-05-21 ENCOUNTER — Telehealth: Payer: Self-pay

## 2023-05-21 VITALS — BP 120/78 | HR 97 | Ht 65.0 in | Wt 354.0 lb

## 2023-05-21 DIAGNOSIS — Z6841 Body Mass Index (BMI) 40.0 and over, adult: Secondary | ICD-10-CM

## 2023-05-21 DIAGNOSIS — G629 Polyneuropathy, unspecified: Secondary | ICD-10-CM

## 2023-05-21 MED ORDER — GABAPENTIN 400 MG PO CAPS
400.0000 mg | ORAL_CAPSULE | Freq: Three times a day (TID) | ORAL | 1 refills | Status: DC
Start: 2023-05-21 — End: 2023-09-06

## 2023-05-21 NOTE — Telephone Encounter (Signed)
 Called pt to address questions. Pt states pharmacy has not received script for gabapentin. Script was sent to [facility] mail order. Pt was happy with that. No further questions or concerns.

## 2023-05-21 NOTE — Telephone Encounter (Signed)
 Copied from CRM [id number]. Topic: General - Other >> May 21, 2023 12:20 PM Aletta Edouard wrote: Reason for CRM: patient is calling I regarding her gabapentin (NEURONTIN) she would like a call back today

## 2023-05-21 NOTE — Telephone Encounter (Signed)
 Copied from CRM [id number]. Topic: Clinical - Red Word Triage >> May 21, 2023  8:05 AM Lennart Pall wrote: Red Word that prompted transfer to Nurse Triage: Patient is in pain when she walks, hard to sleep. Feels like needles in her feet. Very painful   Chief Complaint: Foot Pain Symptoms: Burning, Needle-Like Pain Frequency: Worsening, Ongoing Pertinent Negatives: Patient denies chest pain, shortness of breath, or discoloration.  Disposition: [] ED /[] Urgent Care (no appt availability in office) / [x] Appointment(In office/virtual)/ []  [facility]/ [] Home Care/ [] Refused Recommended Disposition /[] [facility]/ []  Follow-up with PCP Additional Notes: EB is being triaged for pain in her feet bilaterally. The patient currently takes Gabapentin that she states is not currently helping at all. The patient describes the pain as throbbing, needle-like pain. Virtual appointment made with PCP this morning.  Reason for Disposition  Numbness (i.e., loss of sensation) in foot or toes  (Exception: Just tingling; numbness present > 2 weeks.)  Answer Assessment - Initial Assessment Questions 1. ONSET: "When did the pain start?"       A couple of weeks ago  2. LOCATION: "Where is the pain located?"      Both Feet  3. PAIN: "How bad is the pain?"    (Scale 1-10; or mild, moderate, severe)  - MILD (1-3): doesn't interfere with normal activities.   - MODERATE (4-7): interferes with normal activities (e.g., work or school) or awakens from sleep, limping.   - SEVERE (8-10): excruciating pain, unable to do any normal activities, unable to walk.      10  4. WORK OR EXERCISE: "Has there been any recent work or exercise that involved this part of the body?"      No  5. CAUSE: "What do you think is causing the foot pain?"     Neuropathy, Gabapentin Ineffective  6. OTHER SYMPTOMS: "Do you have any other symptoms?" (e.g., leg pain, rash, fever, numbness)     Numbness  7. PREGNANCY: "Is there  any chance you are pregnant?" "When was your last menstrual period?"     No and No  Protocols used: Foot Pain-A-AH

## 2023-05-21 NOTE — Progress Notes (Signed)
 Established Patient Office Visit   Subjective:  Patient ID: Hannah Orr, female    DOB: [birth year]  Age: 48 y.o. MRN: [id number]  Chief Complaint  Patient presents with   Foot Pain    Bilateral foot burning shooting pain for 2 weeks worse yesterday, gabapentin not working      Foot Pain Pertinent negatives include no abdominal pain, myalgias, rash or weakness.   Encounter Diagnoses  Name Primary?   Neuropathy Yes   Morbid obesity with BMI of 50.0-59.9, adult (HCC)    Reports of worsening of the neuropathy in her feet over the last few weeks.  Has been controlled with the gabapentin 400 mg twice daily.  She is found that that is no longer is helpful.  Had previously tried to refer her to bariatric surgery and it was not covered by her insurance.  She has heard that [facility] could be helpful.   Review of Systems  Constitutional: Negative.   HENT: Negative.    Eyes:  Negative for blurred vision, discharge and redness.  Respiratory: Negative.    Cardiovascular: Negative.   Gastrointestinal:  Negative for abdominal pain.  Genitourinary: Negative.   Musculoskeletal: Negative.  Negative for myalgias.  Skin:  Negative for rash.  Neurological:  Negative for tingling, loss of consciousness and weakness.  Endo/Heme/Allergies:  Negative for polydipsia.     Current Outpatient Medications:    albuterol (VENTOLIN HFA) 108 (90 Base) MCG/ACT inhaler, Inhale 1-2 puffs into the lungs every 6 (six) hours as needed for wheezing or shortness of breath. Please instruct, Disp: 8 g, Rfl: 2   buPROPion (WELLBUTRIN XL) 150 MG 24 hr tablet, Take 1 tablet by mouth once daily, Disp: 30 tablet, Rfl: 0   carvedilol (COREG) 25 MG tablet, TAKE 1 TABLET BY MOUTH TWICE DAILY FOR BLOOD PRESSURE, Disp: 180 tablet, Rfl: 3   cyanocobalamin (VITAMIN B12) 1000 MCG tablet, Take 1 tablet (1,000 mcg total) by mouth daily., Disp: 90 tablet, Rfl: 5   FLUoxetine (PROZAC) 40 MG capsule, Take 1 capsule (40 mg total)  by mouth daily., Disp: 90 capsule, Rfl: 1   gabapentin (NEURONTIN) 400 MG capsule, Take 1 capsule (400 mg total) by mouth 3 (three) times daily., Disp: 270 capsule, Rfl: 1   levothyroxine (SYNTHROID) 112 MCG tablet, Take 1 tablet (112 mcg total) by mouth daily before breakfast., Disp: 90 tablet, Rfl: 1   spironolactone (ALDACTONE) 50 MG tablet, Take 1 tablet (50 mg total) by mouth daily., Disp: 90 tablet, Rfl: 3   Torsemide 60 MG TABS, Take 60 mg by mouth daily., Disp: 90 tablet, Rfl: 3   Vitamin D, Ergocalciferol, (DRISDOL) 1.25 MG (50000 UNIT) CAPS capsule, Take 1 capsule (50,000 Units total) by mouth every 7 (seven) days., Disp: 15 capsule, Rfl: 1   Objective:     BP 120/78 Comment: Patient reported  Pulse 97 Comment: Patient reported  Ht 5\' 5"  (1.651 m)   Wt (!) 354 lb (160.6 kg) Comment: Patient reported  BMI 58.91 kg/m    Physical Exam Constitutional:      General: She is not in acute distress.    Appearance: Normal appearance. She is obese. She is not ill-appearing, toxic-appearing or diaphoretic.  HENT:     Head: Normocephalic and atraumatic.     Right Ear: External ear normal.     Left Ear: External ear normal.  Eyes:     General: No scleral icterus.       Right eye: No discharge.  Left eye: No discharge.     Extraocular Movements: Extraocular movements intact.     Conjunctiva/sclera: Conjunctivae normal.  Pulmonary:     Effort: Pulmonary effort is normal. No respiratory distress.  Skin:    General: Skin is warm and dry.  Neurological:     Mental Status: She is alert and oriented to person, place, and time.  Psychiatric:        Mood and Affect: Mood normal.        Behavior: Behavior normal.      No results found for any visits on 05/21/23.    The 10-year ASCVD risk score (Arnett DK, et al., 2019) is: 1.3%    Assessment & Plan:   Neuropathy -     Gabapentin; Take 1 capsule (400 mg total) by mouth 3 (three) times daily.  Dispense: 270 capsule;  Refill: 1  Morbid obesity with BMI of 50.0-59.9, adult (HCC) -     Amb Ref to Medical Weight Management    Return Follow-up in May as previously planned..  Will increase gabapentin to 400 mg 3 times daily.  Medication was much cheaper through her mail order pharmacy and the prescription was sent there.  Referral for weight loss management.  She would benefit from close supervision and follow-up.  Virtual Visit via Video Note  I connected with Hannah Orr on 05/21/23 at  8:20 AM EDT by a video enabled telemedicine application and verified that I am speaking with the correct person using two identifiers.  Location: Patient: at home.  Provider: work   I discussed the limitations of evaluation and management by telemedicine and the availability of in person appointments. The patient expressed understanding and agreed to proceed.  History of Present Illness:    Observations/Objective:   Assessment and Plan:   Follow Up Instructions:    I discussed the assessment and treatment plan with the patient. The patient was provided an opportunity to ask questions and all were answered. The patient agreed with the plan and demonstrated an understanding of the instructions.   The patient was advised to call back or seek an in-person evaluation if the symptoms worsen or if the condition fails to improve as anticipated.  I provided 25 minutes of non-face-to-face time during this encounter.   Mliss Sax, MD   Mliss Sax, MD

## 2023-05-21 NOTE — Telephone Encounter (Signed)
 Patient had virtual appointment with Dr. Doreene Burke this morning 05/21/23 and concerns were addressed.

## 2023-05-22 ENCOUNTER — Telehealth: Payer: Self-pay | Admitting: Family Medicine

## 2023-05-22 NOTE — Telephone Encounter (Addendum)
 Error

## 2023-05-22 NOTE — Telephone Encounter (Signed)
 Yes she said she called about it.

## 2023-05-22 NOTE — Telephone Encounter (Signed)
 Copied from CRM [id number]. Topic: General - Other >> May 22, 2023  3:12 PM Fredrich Romans wrote: Reason for CRM: Patient is coming over to drop off a form to office to have her excused  from jury duty.She stated that once she gets there,she would like to have someone come get it from the car for her,due to her having a hard time walking.She will com drop off tomorrow.

## 2023-05-22 NOTE — Telephone Encounter (Signed)
 Pt will be by on 3/26 to bring a form to excuse her from [place] duty.I will get the form for her and bring it in. She would like 2 copies.

## 2023-05-23 ENCOUNTER — Telehealth (INDEPENDENT_AMBULATORY_CARE_PROVIDER_SITE_OTHER): Payer: Self-pay

## 2023-05-23 NOTE — Telephone Encounter (Signed)
 Pt called, she understood will call her leg dr.

## 2023-05-23 NOTE — Telephone Encounter (Signed)
 Patient reach out to the office requesting if the provider would fill out the form to be excused out of jury duty. Please Advise

## 2023-05-24 NOTE — Telephone Encounter (Signed)
 Patient was notified that Dr Gilda Crease is fine with completing the jury duty form. Patient will bring form by the office on tomorrow.

## 2023-05-29 ENCOUNTER — Other Ambulatory Visit: Payer: Self-pay | Admitting: Family Medicine

## 2023-05-29 ENCOUNTER — Telehealth: Payer: Self-pay

## 2023-05-29 DIAGNOSIS — G629 Polyneuropathy, unspecified: Secondary | ICD-10-CM

## 2023-05-29 NOTE — Telephone Encounter (Signed)
 MyChart message sent to pt in regard to message below.

## 2023-05-29 NOTE — Telephone Encounter (Signed)
 Copied from CRM [id number]. Topic: Clinical - Medication Refill >> May 29, 2023 12:13 PM Alcus Dad wrote: Most Recent Primary Care Visit:  Provider: Mliss Sax  Department: [facility]  Visit Type: ACUTE  Date: 05/21/2023  Medication: gabapentin (NEURONTIN) 400 MG capsule  Has the patient contacted their pharmacy? Yes (Agent: If no, request that the patient contact the pharmacy for the refill. If patient does not wish to contact the pharmacy document the reason why and proceed with request.) (Agent: If yes, when and what did the pharmacy advise?)  Is this the correct pharmacy for this prescription? Yes If no, delete pharmacy and type the correct one.  This is the patient's preferred pharmacy:  [facility] [address], [place] - [address] [place] [address] Phone: [phone] Fax: [phone]   Has the prescription been filled recently? Yes  Is the patient out of the medication? Yes  Has the patient been seen for an appointment in the last year OR does the patient have an upcoming appointment? Yes  Can we respond through MyChart? Yes  Agent: Please be advised that Rx refills may take up to 3 business days. We ask that you follow-up with your pharmacy.

## 2023-05-29 NOTE — Telephone Encounter (Signed)
 Copied from CRM [id number]. Topic: Clinical - Prescription Issue >> May 29, 2023  1:01 PM Pascal Lux wrote: Reason for CRM: Patient stated that she spoke with [facility]  regarding her gabapentin (NEURONTIN) 400 MG capsule [[id number]] and they said her order will not be delivered until 7 business days from today. Patient is calling to ask if we have samples that we can provide her until she receives her medication.

## 2023-06-03 NOTE — Progress Notes (Unsigned)
 MRN : [id number]  Hannah Orr is a 48 y.o. (Sep 05, 1975) female who presents with chief complaint of legs hurt and swell.  History of Present Illness:   Patient is seen for follow up evaluation of leg pain and swelling associated with venous ulceration. The patient was seen here and started on Unna boot therapy.  The swelling had abruptly become much worse bilaterally and is associated with pain and discoloration. The pain and swelling worsens with prolonged dependency and improves with elevation.  When it first seemed to get out of control she noted an area in the proximal left calf that is increasingly tender and a place on the lateral right calf that itches intensely.  Her left leg has since improved but her right leg remains problematic   The patient notes that an ulcer has developed acutely in the right lower shin without specific trauma and since it occurred it has been very slow to heal.  There is a moderate amount of drainage associated with the open area.  The wound is also painful.   The patient states that they have been elevating as much as possible. The patient denies any recent changes in medications.  She is also been using her lymph pump as much as she can frequently now twice a day   The patient denies a history of DVT or PE. There is no prior history of phlebitis. There is no history of primary lymphedema.   No SOB or increased cough.  No sputum production.  No recent episodes of CHF exacerbation.  No outpatient medications have been marked as taking for the 06/04/23 encounter (Appointment) with Gilda Crease, Latina Craver, MD.    Past Medical History:  Diagnosis Date   Depression    Hypertension     Past Surgical History:  Procedure Laterality Date   MANDIBLE SURGERY Right 12/11/1993    Social History Social History   Tobacco Use   Smoking status: Never   Smokeless tobacco: Never  Vaping Use   Vaping status: Never Used  Substance Use Topics   Alcohol  use: Not Currently    Comment: Stopped drinking December 2021.    Drug use: No    Family History Family History  Problem Relation Age of Onset   Hypertension Mother    Breast cancer Mother    Hypertension Father    Prostate cancer Father    Cancer Neg Hx    Heart disease Neg Hx    Hyperlipidemia Neg Hx    Kidney disease Neg Hx    Diabetes Neg Hx    Early death Neg Hx    Hearing loss Neg Hx    Stroke Neg Hx     Allergies  Allergen Reactions   Benazepril Hcl Hypertension    cough   Benazepril Hcl     cough     REVIEW OF SYSTEMS (Negative unless checked)  Constitutional: [] Weight loss  [] Fever  [] Chills Cardiac: [] Chest pain   [] Chest pressure   [] Palpitations   [] Shortness of breath when laying flat   [] Shortness of breath with exertion. Vascular:  [] Pain in legs with walking   [x] Pain in legs at rest  [] History of DVT   [] Phlebitis   [x] Swelling in legs   [] Varicose veins   [] Non-healing ulcers Pulmonary:   [] Uses home oxygen   [] Productive cough   [] Hemoptysis   [] Wheeze  [] COPD   [] Asthma Neurologic:  [] Dizziness   [] Seizures   [] History of stroke   []   History of TIA  [] Aphasia   [] Vissual changes   [] Weakness or numbness in arm   [] Weakness or numbness in leg Musculoskeletal:   [] Joint swelling   [] Joint pain   [] Low back pain Hematologic:  [] Easy bruising  [] Easy bleeding   [] Hypercoagulable state   [] Anemic Gastrointestinal:  [] Diarrhea   [] Vomiting  [x] Gastroesophageal reflux/heartburn   [] Difficulty swallowing. Genitourinary:  [] Chronic kidney disease   [] Difficult urination  [] Frequent urination   [] Blood in urine Skin:  [] Rashes   [] Ulcers  Psychological:  [] History of anxiety   []  History of major depression.  Physical Examination  There were no vitals filed for this visit. There is no height or weight on file to calculate BMI. Gen: WD/WN, NAD Head: [place]/AT, No temporalis wasting.  Ear/Nose/Throat: Hearing grossly intact, nares w/o erythema or drainage, pinna  without lesions Eyes: PER, EOMI, sclera nonicteric.  Neck: Supple, no gross masses.  No JVD.  Pulmonary:  Good air movement, no audible wheezing, no use of accessory muscles.  Cardiac: RRR, precordium not hyperdynamic. Vascular:   Moderate venous stasis changes to the legs bilaterally.  4+ variable soft pitting edema associated with more firm nonpitting areas. CEAP [id number] there is an open wound in the right lower shin which appears uninfected and seems more superficial today Vessel Right Left  Radial Palpable Palpable  Gastrointestinal: soft, non-distended. No guarding/no peritoneal signs.  Musculoskeletal: M/S 5/5 throughout.  No deformity.  Neurologic: CN 2-12 intact. Pain and light touch intact in extremities.  Symmetrical.  Speech is fluent. Motor exam as listed above. Psychiatric: Judgment intact, Mood & affect appropriate for pt's clinical situation. Dermatologic: Venous rashes no ulcers noted.  No changes consistent with cellulitis. Lymph : No lichenification or skin changes of chronic lymphedema.  CBC Lab Results  Component Value Date   WBC 2.7 (L) 07/17/2022   HGB 12.4 07/17/2022   HCT 37.3 07/17/2022   MCV 113.3 Repeated and verified X2. (H) 07/17/2022   PLT 293.0 07/17/2022    BMET    Component Value Date/Time   NA 133 (L) 05/09/2021 1152   NA 134 03/25/2021 1412   K 3.9 05/09/2021 1152   CL 96 05/09/2021 1152   CO2 22 05/09/2021 1152   GLUCOSE 95 05/09/2021 1152   BUN 10 05/09/2021 1152   BUN 9 03/25/2021 1412   CREATININE 0.79 05/09/2021 1152   CREATININE 0.75 07/28/2020 1205   CALCIUM 9.5 05/09/2021 1152   GFRNONAA >60 07/28/2020 1205   CrCl cannot be calculated (Patient's most recent lab result is older than the maximum 21 days allowed.).  COAG No results found for: "INR", "PROTIME"  Radiology No results found.   Assessment/Plan 1. Venous ulcer (HCC) (Primary) No surgery or intervention at this point in time.     I have had a long discussion  with the patient regarding venous insufficiency and why it  causes symptoms, specifically venous ulceration. I have discussed with the patient the chronic skin changes that accompany venous insufficiency and the long term sequela such as infection and recurring  ulceration.  Patient will be placed in [employer] which will be changed weekly drainage permitting.   In addition, behavioral modification including several periods of elevation of the lower extremities during the day will be continued. Achieving a position with the ankles at heart level was stressed to the patient   The patient is instructed to begin routine exercise, especially walking on a daily basis   In the future the patient can be assessed  for graduated compression stockings or wraps as well as a Lymph Pump once the ulcers are healed.  The patient has made me aware that she has been summoned for jury duty and may 2025.  As her attending physician would respectfully request that she be eliminated from jury service.  She has profound lymphedema with chronic ulcerations and is unable to sit with her legs dependent for extended periods of time.  Typically I request that she not remain with her feet down for more than an hour without then being able to elevate her feet above heart level.  With her feet in a dependent position she will weep lymphedema fluid and be at a very high risk for infection and/or ulcerations.  This is a lifelong chronic condition.  Consequently, my request that she be excused from all future jury service.  2. Lymphedema No surgery or intervention at this point in time.     I have had a long discussion with the patient regarding venous insufficiency and why it  causes symptoms, specifically venous ulceration. I have discussed with the patient the chronic skin changes that accompany venous insufficiency and the long term sequela such as infection and recurring  ulceration.  Patient will be placed in [employer] which will  be changed weekly drainage permitting.   In addition, behavioral modification including several periods of elevation of the lower extremities during the day will be continued. Achieving a position with the ankles at heart level was stressed to the patient   The patient is instructed to begin routine exercise, especially walking on a daily basis   In the future the patient can be assessed for graduated compression stockings or wraps as well as a Lymph Pump once the ulcers are healed.  The patient has made me aware that she has been summoned for jury duty and may 2025.  As her attending physician would respectfully request that she be eliminated from jury service.  She has profound lymphedema with chronic ulcerations and is unable to sit with her legs dependent for extended periods of time.  Typically I request that she not remain with her feet down for more than an hour without then being able to elevate her feet above heart level.  With her feet in a dependent position she will weep lymphedema fluid and be at a very high risk for infection and/or ulcerations.  This is a lifelong chronic condition.  Consequently, my request that she be excused from all future jury service.  3. Venous insufficiency No surgery or intervention at this point in time.     I have had a long discussion with the patient regarding venous insufficiency and why it  causes symptoms, specifically venous ulceration. I have discussed with the patient the chronic skin changes that accompany venous insufficiency and the long term sequela such as infection and recurring  ulceration.  Patient will be placed in [employer] which will be changed weekly drainage permitting.   In addition, behavioral modification including several periods of elevation of the lower extremities during the day will be continued. Achieving a position with the ankles at heart level was stressed to the patient   The patient is instructed to begin routine  exercise, especially walking on a daily basis   In the future the patient can be assessed for graduated compression stockings or wraps as well as a Lymph Pump once the ulcers are healed.  4. Essential hypertension Continue antihypertensive medications as already ordered, these medications have been  reviewed and there are no changes at this time.    Levora Dredge, MD  06/03/2023 3:23 PM

## 2023-06-04 ENCOUNTER — Ambulatory Visit (INDEPENDENT_AMBULATORY_CARE_PROVIDER_SITE_OTHER): Payer: Medicare Other | Admitting: Vascular Surgery

## 2023-06-04 ENCOUNTER — Encounter (INDEPENDENT_AMBULATORY_CARE_PROVIDER_SITE_OTHER): Payer: Self-pay | Admitting: Vascular Surgery

## 2023-06-04 ENCOUNTER — Telehealth: Payer: Self-pay

## 2023-06-04 VITALS — BP 157/89 | HR 80 | Resp 18 | Wt 325.0 lb

## 2023-06-04 DIAGNOSIS — I83009 Varicose veins of unspecified lower extremity with ulcer of unspecified site: Secondary | ICD-10-CM

## 2023-06-04 DIAGNOSIS — I10 Essential (primary) hypertension: Secondary | ICD-10-CM

## 2023-06-04 DIAGNOSIS — I890 Lymphedema, not elsewhere classified: Secondary | ICD-10-CM

## 2023-06-04 DIAGNOSIS — I872 Venous insufficiency (chronic) (peripheral): Secondary | ICD-10-CM | POA: Diagnosis not present

## 2023-06-04 DIAGNOSIS — L97909 Non-pressure chronic ulcer of unspecified part of unspecified lower leg with unspecified severity: Secondary | ICD-10-CM | POA: Diagnosis not present

## 2023-06-04 NOTE — Telephone Encounter (Signed)
**Note De-Identified Jasim Harari Obfuscation** Ordering provider: Dr Antoine Poche Associated diagnoses: OSA-G47.33 and SOB-R06.02  WatchPAT PA obtained on 06/04/2023 by Bren Borys, Lorelle Formosa, LPN. Authorization: Per the Medicare Part A and B website:  Prior Authorization requests for Sleep Study codes (46962 (itamar-HST), [id number], [id number], [id number], [email or web address], [email or web address]) will not require a Prior Authorization.   Patient notified of PIN ([id number]) on 06/04/2023 Debi Cousin Notification Method: phone. The pt states that she was not given a WatchPAT One-HST device when Dr Antoine Poche ordered it at her 01/24 office visit with him. She is aware that someone will be calling her to set up a time for her to come by the office to pick the device up and to get instructions for use. I did provide the PIN # but she may need a reminder.  Phone note routed to covering staff for follow-up.

## 2023-06-05 NOTE — Telephone Encounter (Signed)
 Notified patient that [facility] Sleep Test is approved and ready to be picked up. PIN given to patient. All questions answered and patient verbalized understanding.

## 2023-06-14 ENCOUNTER — Other Ambulatory Visit: Payer: Self-pay | Admitting: Family Medicine

## 2023-06-14 DIAGNOSIS — F324 Major depressive disorder, single episode, in partial remission: Secondary | ICD-10-CM

## 2023-06-18 ENCOUNTER — Ambulatory Visit
Admission: RE | Admit: 2023-06-18 | Discharge: 2023-06-18 | Disposition: A | Discharge status: Home / Self Care | Source: Ambulatory Visit | Attending: Family Medicine | Admitting: Family Medicine

## 2023-06-18 DIAGNOSIS — Z1231 Encounter for screening mammogram for malignant neoplasm of breast: Secondary | ICD-10-CM

## 2023-07-09 ENCOUNTER — Telehealth (INDEPENDENT_AMBULATORY_CARE_PROVIDER_SITE_OTHER): Payer: Self-pay

## 2023-07-09 NOTE — Telephone Encounter (Signed)
 Called patient told them to come up to the 5th floor coumadin clinic to pick up sleep study.

## 2023-07-09 NOTE — Telephone Encounter (Signed)
 She has an appointment in a week.  They could place xeroform over the area to protect, the gauze may be irritating the skin

## 2023-07-09 NOTE — Telephone Encounter (Signed)
 [place] called stating that Hannah Orr has redness around her toes her unna boots starts so she put gauzes to protect that area. She was thinking maybe she could try the zinc only wraps. She stated that Hannah Orr legs are more swelling and the wound in the back has dead skin, maybe she needs an appt soon. The measurements are 4.3x4x3 currently and .2x.2x0 last measurement.   Please advise

## 2023-07-10 ENCOUNTER — Other Ambulatory Visit: Payer: Self-pay | Admitting: Family Medicine

## 2023-07-10 ENCOUNTER — Encounter (INDEPENDENT_AMBULATORY_CARE_PROVIDER_SITE_OTHER): Admitting: Family Medicine

## 2023-07-10 DIAGNOSIS — F324 Major depressive disorder, single episode, in partial remission: Secondary | ICD-10-CM

## 2023-07-10 NOTE — Telephone Encounter (Signed)
 She stated she has someone going out there later this week.

## 2023-07-12 NOTE — Progress Notes (Signed)
 MRN : [id number]  Hannah Orr is a 48 y.o. ([birth year]) female who presents with chief complaint of legs swell.  History of Present Illness:   Patient is seen for follow up evaluation of leg pain and swelling associated with venous ulceration.  She was last seen about 6 weeks ago on June 04, 2023.  The patient was seen here and started on Unna boot therapy.  The swelling had abruptly become much worse bilaterally and is associated with pain and discoloration. The pain and swelling worsens with prolonged dependency and improves with elevation.  When it first seemed to get out of control she noted an area in the proximal left calf that is increasingly tender and a place on the lateral right calf that itches intensely.  Her left leg has since improved but her right leg remains problematic and her ulcer seems to be nonhealing   The patient notes that an ulcer has developed acutely in the right lower shin without specific trauma and since it occurred it has been very slow to heal.  There is a moderate amount of drainage associated with the open area.  The wound is also painful.   The patient states that they have been elevating as much as possible. The patient denies any recent changes in medications.  She is also been using her lymph pump as much as she can frequently now twice a day   The patient denies a history of DVT or PE. There is no prior history of phlebitis. There is no history of primary lymphedema.   No SOB or increased cough.  No sputum production.  No recent episodes of CHF exacerbation.  No outpatient medications have been marked as taking for the 07/16/23 encounter (Appointment) with Prescilla Brod, Ninette Basque, MD.    Past Medical History:  Diagnosis Date   Depression    Hypertension     Past Surgical History:  Procedure Laterality Date   MANDIBLE SURGERY Right 12/11/1993    Social History Social History   Tobacco Use   Smoking status: Never    Smokeless tobacco: Never  Vaping Use   Vaping status: Never Used  Substance Use Topics   Alcohol use: Not Currently    Comment: Stopped drinking December 2021.    Drug use: No    Family History Family History  Problem Relation Age of Onset   Hypertension Mother    Breast cancer Mother    Hypertension Father    Prostate cancer Father    Cancer Neg Hx    Heart disease Neg Hx    Hyperlipidemia Neg Hx    Kidney disease Neg Hx    Diabetes Neg Hx    Early death Neg Hx    Hearing loss Neg Hx    Stroke Neg Hx     Allergies  Allergen Reactions   Benazepril Hcl Hypertension    cough   Benazepril Hcl     cough     REVIEW OF SYSTEMS (Negative unless checked)  Constitutional: [] Weight loss  [] Fever  [] Chills Cardiac: [] Chest pain   [] Chest pressure   [] Palpitations   [] Shortness of breath when laying flat   [] Shortness of breath with exertion. Vascular:  [] Pain in legs with walking   [x] Pain in legs with standing  [] History of DVT   [] Phlebitis   [x] Swelling in  legs   [] Varicose veins   [] Non-healing ulcers Pulmonary:   [] Uses home oxygen   [] Productive cough   [] Hemoptysis   [] Wheeze  [] COPD   [] Asthma Neurologic:  [] Dizziness   [] Seizures   [] History of stroke   [] History of TIA  [] Aphasia   [] Vissual changes   [] Weakness or numbness in arm   [] Weakness or numbness in leg Musculoskeletal:   [] Joint swelling   [] Joint pain   [] Low back pain Hematologic:  [] Easy bruising  [] Easy bleeding   [] Hypercoagulable state   [] Anemic Gastrointestinal:  [] Diarrhea   [] Vomiting  [] Gastroesophageal reflux/heartburn   [] Difficulty swallowing. Genitourinary:  [] Chronic kidney disease   [] Difficult urination  [] Frequent urination   [] Blood in urine Skin:  [] Rashes   [] Ulcers  Psychological:  [] History of anxiety   []  History of major depression.  Physical Examination  There were no vitals filed for this visit. There is no height or weight on file to calculate BMI. Gen: WD/WN, NAD Head:  [place]/AT, No temporalis wasting.  Ear/Nose/Throat: Hearing grossly intact, nares w/o erythema or drainage, pinna without lesions Eyes: PER, EOMI, sclera nonicteric.  Neck: Supple, no gross masses.  No JVD.  Pulmonary:  Good air movement, no audible wheezing, no use of accessory muscles.  Cardiac: RRR, precordium not hyperdynamic. Vascular:  scattered varicosities present bilaterally.  Severe venous stasis changes to the legs bilaterally.  Ulceration right lateral ankle appears superficial but with exudate.  4+ soft pitting edema, CEAP C4sEpAsPr  Vessel Right Left  Radial Palpable Palpable  Gastrointestinal: soft, non-distended. No guarding/no peritoneal signs.  Musculoskeletal: M/S 5/5 throughout.  No deformity.  Neurologic: CN 2-12 intact. Pain and light touch intact in extremities.  Symmetrical.  Speech is fluent. Motor exam as listed above. Psychiatric: Judgment intact, Mood & affect appropriate for pt's clinical situation. Dermatologic: Venous rashes no ulcers noted.  No changes consistent with cellulitis. Lymph : No lichenification or skin changes of chronic lymphedema.  CBC Lab Results  Component Value Date   WBC 2.7 (L) 07/17/2022   HGB 12.4 07/17/2022   HCT 37.3 07/17/2022   MCV 113.3 Repeated and verified X2. (H) 07/17/2022   PLT 293.0 07/17/2022    BMET    Component Value Date/Time   NA 133 (L) 05/09/2021 1152   NA 134 03/25/2021 1412   K 3.9 05/09/2021 1152   CL 96 05/09/2021 1152   CO2 22 05/09/2021 1152   GLUCOSE 95 05/09/2021 1152   BUN 10 05/09/2021 1152   BUN 9 03/25/2021 1412   CREATININE 0.79 05/09/2021 1152   CREATININE 0.75 07/28/2020 1205   CALCIUM 9.5 05/09/2021 1152   GFRNONAA >60 07/28/2020 1205   CrCl cannot be calculated (Patient's most recent lab result is older than the maximum 21 days allowed.).  COAG No results found for: "INR", "PROTIME"  Radiology MM 3D SCREENING MAMMOGRAM BILATERAL BREAST Result Date: 07/10/2023 CLINICAL DATA:  Screening.  EXAM: DIGITAL SCREENING BILATERAL MAMMOGRAM WITH TOMOSYNTHESIS AND CAD TECHNIQUE: Bilateral screening digital craniocaudal and mediolateral oblique mammograms were obtained. Bilateral screening digital breast tomosynthesis was performed. The images were evaluated with computer-aided detection. COMPARISON:  Previous exam(s). ACR Breast Density Category b: There are scattered areas of fibroglandular density. FINDINGS: There are no findings suspicious for malignancy. IMPRESSION: No mammographic evidence of malignancy. A result letter of this screening mammogram will be mailed directly to the patient. RECOMMENDATION: Screening mammogram in one year. (Code:SM-B-01Y) BI-RADS CATEGORY  1: Negative. Electronically Signed   By: Sharleen Dawley.D.  On: 07/10/2023 10:06     Assessment/Plan 1. Venous ulcer (HCC) No surgery or intervention at this point in time.     I have had a long discussion with the patient regarding venous insufficiency and why it  causes symptoms, specifically venous ulceration. I have discussed with the patient the chronic skin changes that accompany venous insufficiency and the long term sequela such as infection and recurring  ulceration.  Patient will be placed in [employer] which will be changed weekly drainage permitting.  Given the exudate and the fact that the wound seems to be stagnant I would add Aquacel Ag and then the [employer].   In addition, behavioral modification including several periods of elevation of the lower extremities during the day will be continued. Achieving a position with the ankles at heart level was stressed to the patient   The patient is instructed to begin routine exercise, especially walking on a daily basis   In the future the patient can be assessed for graduated compression stockings or wraps as well as a Lymph Pump once the ulcers are healed.   The patient has made me aware that she has been summoned for jury duty and may 2025.  As her attending  physician would respectfully request that she be eliminated from jury service.  She has profound lymphedema with chronic ulcerations and is unable to sit with her legs dependent for extended periods of time.  Typically I request that she not remain with her feet down for more than an hour without then being able to elevate her feet above heart level.  With her feet in a dependent position she will weep lymphedema fluid and be at a very high risk for infection and/or ulcerations.  This is a lifelong chronic condition.  Consequently, my request that she be excused from all future jury service.  2. Lymphedema (Primary) No surgery or intervention at this point in time.     I have had a long discussion with the patient regarding venous insufficiency and why it  causes symptoms, specifically venous ulceration. I have discussed with the patient the chronic skin changes that accompany venous insufficiency and the long term sequela such as infection and recurring  ulceration.  Patient will be placed in [employer] which will be changed weekly drainage permitting.   In addition, behavioral modification including several periods of elevation of the lower extremities during the day will be continued. Achieving a position with the ankles at heart level was stressed to the patient   The patient is instructed to begin routine exercise, especially walking on a daily basis   In the future the patient can be assessed for graduated compression stockings or wraps as well as a Lymph Pump once the ulcers are healed.   The patient has made me aware that she has been summoned for jury duty and may 2025.  As her attending physician would respectfully request that she be eliminated from jury service.  She has profound lymphedema with chronic ulcerations and is unable to sit with her legs dependent for extended periods of time.  Typically I request that she not remain with her feet down for more than an hour without then being  able to elevate her feet above heart level.  With her feet in a dependent position she will weep lymphedema fluid and be at a very high risk for infection and/or ulcerations.  This is a lifelong chronic condition.  Consequently, my request that she be excused from  all future jury service.  3. Venous insufficiency No surgery or intervention at this point in time.     I have had a long discussion with the patient regarding venous insufficiency and why it  causes symptoms, specifically venous ulceration. I have discussed with the patient the chronic skin changes that accompany venous insufficiency and the long term sequela such as infection and recurring  ulceration.  Patient will be placed in [employer] which will be changed weekly drainage permitting.   In addition, behavioral modification including several periods of elevation of the lower extremities during the day will be continued. Achieving a position with the ankles at heart level was stressed to the patient   The patient is instructed to begin routine exercise, especially walking on a daily basis   In the future the patient can be assessed for graduated compression stockings or wraps as well as a Lymph Pump once the ulcers are healed.   The patient has made me aware that she has been summoned for jury duty and may 2025.  As her attending physician would respectfully request that she be eliminated from jury service.  She has profound lymphedema with chronic ulcerations and is unable to sit with her legs dependent for extended periods of time.  Typically I request that she not remain with her feet down for more than an hour without then being able to elevate her feet above heart level.  With her feet in a dependent position she will weep lymphedema fluid and be at a very high risk for infection and/or ulcerations.  This is a lifelong chronic condition.  Consequently, my request that she be excused from all future jury service.  4. Essential  hypertension Continue antihypertensive medications as already ordered, these medications have been reviewed and there are no changes at this time.  5. Mild intermittent reactive airway disease with acute exacerbation Continue pulmonary medications and aerosols as already ordered, these medications have been reviewed and there are no changes at this time.     Devon Fogo, MD  07/12/2023 12:40 PM

## 2023-07-16 ENCOUNTER — Encounter (INDEPENDENT_AMBULATORY_CARE_PROVIDER_SITE_OTHER): Payer: Self-pay

## 2023-07-16 ENCOUNTER — Ambulatory Visit (INDEPENDENT_AMBULATORY_CARE_PROVIDER_SITE_OTHER): Admitting: Vascular Surgery

## 2023-07-16 ENCOUNTER — Encounter (INDEPENDENT_AMBULATORY_CARE_PROVIDER_SITE_OTHER): Payer: Self-pay | Admitting: Vascular Surgery

## 2023-07-16 VITALS — BP 159/95 | HR 80 | Resp 18 | Wt 375.0 lb

## 2023-07-16 DIAGNOSIS — I890 Lymphedema, not elsewhere classified: Secondary | ICD-10-CM | POA: Diagnosis not present

## 2023-07-16 DIAGNOSIS — J4521 Mild intermittent asthma with (acute) exacerbation: Secondary | ICD-10-CM

## 2023-07-16 DIAGNOSIS — I10 Essential (primary) hypertension: Secondary | ICD-10-CM

## 2023-07-16 DIAGNOSIS — I83009 Varicose veins of unspecified lower extremity with ulcer of unspecified site: Secondary | ICD-10-CM | POA: Diagnosis not present

## 2023-07-16 DIAGNOSIS — I872 Venous insufficiency (chronic) (peripheral): Secondary | ICD-10-CM

## 2023-07-16 DIAGNOSIS — L97909 Non-pressure chronic ulcer of unspecified part of unspecified lower leg with unspecified severity: Secondary | ICD-10-CM

## 2023-07-17 ENCOUNTER — Encounter (INDEPENDENT_AMBULATORY_CARE_PROVIDER_SITE_OTHER): Payer: Self-pay

## 2023-07-19 ENCOUNTER — Ambulatory Visit: Payer: Medicare Other | Admitting: Family Medicine

## 2023-07-20 ENCOUNTER — Telehealth (INDEPENDENT_AMBULATORY_CARE_PROVIDER_SITE_OTHER): Payer: Self-pay

## 2023-07-20 NOTE — Telephone Encounter (Signed)
 Patient reach out to the office stating that her unna wraps were tight and was seeing if she could cut come of her wrap. Patient was advise to contact home health and see if they could send a nurse to home to change wraps. Patient verbalized understanding.

## 2023-07-25 ENCOUNTER — Other Ambulatory Visit (INDEPENDENT_AMBULATORY_CARE_PROVIDER_SITE_OTHER): Payer: Self-pay | Admitting: Vascular Surgery

## 2023-07-25 DIAGNOSIS — L97909 Non-pressure chronic ulcer of unspecified part of unspecified lower leg with unspecified severity: Secondary | ICD-10-CM

## 2023-07-25 NOTE — Progress Notes (Unsigned)
 MRN : [id number]  Hannah Orr is a 48 y.o. ([birth year]) female who presents with chief complaint of legs swell.  History of Present Illness:  Patient is seen for follow up evaluation of leg pain and swelling associated with venous ulceration.  She was last seen about 6 weeks ago on June 04, 2023.  The patient was seen here and started on Unna boot therapy.  The swelling had abruptly become much worse bilaterally and is associated with pain and discoloration. The pain and swelling worsens with prolonged dependency and improves with elevation.  When it first seemed to get out of control she noted an area in the proximal left calf that is increasingly tender and a place on the lateral right calf that itches intensely.  Her left leg has since improved but her right leg remains problematic and her ulcer seems to be nonhealing   The patient notes that an ulcer has developed acutely in the right lower shin without specific trauma and since it occurred it has been very slow to heal.  There is a moderate amount of drainage associated with the open area.  The wound is also painful.   The patient states that they have been elevating as much as possible. The patient denies any recent changes in medications.  She is also been using her lymph pump as much as she can frequently now twice a day   The patient denies a history of DVT or PE. There is no prior history of phlebitis. There is no history of primary lymphedema.   No SOB or increased cough.  No sputum production.  No recent episodes of CHF exacerbation.   No outpatient medications have been marked as taking for the 07/26/23 encounter (Appointment) with Prescilla Brod, Ninette Basque, MD.    Past Medical History:  Diagnosis Date   Depression    Hypertension     Past Surgical History:  Procedure Laterality Date   MANDIBLE SURGERY Right 12/11/1993    Social History Social History   Tobacco Use   Smoking status: Never    Smokeless tobacco: Never  Vaping Use   Vaping status: Never Used  Substance Use Topics   Alcohol use: Not Currently    Comment: Stopped drinking December 2021.    Drug use: No    Family History Family History  Problem Relation Age of Onset   Hypertension Mother    Breast cancer Mother    Hypertension Father    Prostate cancer Father    Cancer Neg Hx    Heart disease Neg Hx    Hyperlipidemia Neg Hx    Kidney disease Neg Hx    Diabetes Neg Hx    Early death Neg Hx    Hearing loss Neg Hx    Stroke Neg Hx     Allergies  Allergen Reactions   Benazepril Hcl Hypertension    cough   Benazepril Hcl     cough     REVIEW OF SYSTEMS (Negative unless checked)  Constitutional: [] Weight loss  [] Fever  [] Chills Cardiac: [] Chest pain   [] Chest pressure   [] Palpitations   [] Shortness of breath when laying flat   [] Shortness of breath with exertion. Vascular:  [] Pain in legs with walking   [x] Pain in legs with standing  [] History of DVT   [] Phlebitis   [x] Swelling in  legs   [] Varicose veins   [] Non-healing ulcers Pulmonary:   [] Uses home oxygen   [] Productive cough   [] Hemoptysis   [] Wheeze  [] COPD   [] Asthma Neurologic:  [] Dizziness   [] Seizures   [] History of stroke   [] History of TIA  [] Aphasia   [] Vissual changes   [] Weakness or numbness in arm   [] Weakness or numbness in leg Musculoskeletal:   [] Joint swelling   [] Joint pain   [] Low back pain Hematologic:  [] Easy bruising  [] Easy bleeding   [] Hypercoagulable state   [] Anemic Gastrointestinal:  [] Diarrhea   [] Vomiting  [] Gastroesophageal reflux/heartburn   [] Difficulty swallowing. Genitourinary:  [] Chronic kidney disease   [] Difficult urination  [] Frequent urination   [] Blood in urine Skin:  [] Rashes   [] Ulcers  Psychological:  [] History of anxiety   []  History of major depression.  Physical Examination  There were no vitals filed for this visit. There is no height or weight on file to calculate BMI. Gen: WD/WN, NAD Head:  [place]/AT, No temporalis wasting.  Ear/Nose/Throat: Hearing grossly intact, nares w/o erythema or drainage, pinna without lesions Eyes: PER, EOMI, sclera nonicteric.  Neck: Supple, no gross masses.  No JVD.  Pulmonary:  Good air movement, no audible wheezing, no use of accessory muscles.  Cardiac: RRR, precordium not hyperdynamic. Vascular:  scattered varicosities present bilaterally.  Mild venous stasis changes to the legs bilaterally.  3-4+ soft pitting edema, CEAP C4sEpAsPr  Vessel Right Left  Radial Palpable Palpable  Gastrointestinal: soft, non-distended. No guarding/no peritoneal signs.  Musculoskeletal: M/S 5/5 throughout.  No deformity.  Neurologic: CN 2-12 intact. Pain and light touch intact in extremities.  Symmetrical.  Speech is fluent. Motor exam as listed above. Psychiatric: Judgment intact, Mood & affect appropriate for pt's clinical situation. Dermatologic: Venous rashes no ulcers noted.  No changes consistent with cellulitis. Lymph : No lichenification or skin changes of chronic lymphedema.  CBC Lab Results  Component Value Date   WBC 2.7 (L) 07/17/2022   HGB 12.4 07/17/2022   HCT 37.3 07/17/2022   MCV 113.3 Repeated and verified X2. (H) 07/17/2022   PLT 293.0 07/17/2022    BMET    Component Value Date/Time   NA 133 (L) 05/09/2021 1152   NA 134 03/25/2021 1412   K 3.9 05/09/2021 1152   CL 96 05/09/2021 1152   CO2 22 05/09/2021 1152   GLUCOSE 95 05/09/2021 1152   BUN 10 05/09/2021 1152   BUN 9 03/25/2021 1412   CREATININE 0.79 05/09/2021 1152   CREATININE 0.75 07/28/2020 1205   CALCIUM 9.5 05/09/2021 1152   GFRNONAA >60 07/28/2020 1205   CrCl cannot be calculated (Patient's most recent lab result is older than the maximum 21 days allowed.).  COAG No results found for: "INR", "PROTIME"  Radiology No results found.   Assessment/Plan 1. Venous ulcer (HCC) (Primary) ***  2. Venous insufficiency ***  3. Essential hypertension ***  4.  Gastroesophageal reflux disease, unspecified whether esophagitis present ***    Devon Fogo, MD  07/25/2023 7:44 AM

## 2023-07-26 ENCOUNTER — Ambulatory Visit (INDEPENDENT_AMBULATORY_CARE_PROVIDER_SITE_OTHER)

## 2023-07-26 ENCOUNTER — Ambulatory Visit (INDEPENDENT_AMBULATORY_CARE_PROVIDER_SITE_OTHER): Admitting: Vascular Surgery

## 2023-07-26 VITALS — BP 177/83 | HR 86 | Resp 18 | Ht 66.0 in | Wt 387.0 lb

## 2023-07-26 DIAGNOSIS — L97909 Non-pressure chronic ulcer of unspecified part of unspecified lower leg with unspecified severity: Secondary | ICD-10-CM

## 2023-07-26 DIAGNOSIS — I872 Venous insufficiency (chronic) (peripheral): Secondary | ICD-10-CM | POA: Diagnosis not present

## 2023-07-26 DIAGNOSIS — I10 Essential (primary) hypertension: Secondary | ICD-10-CM

## 2023-07-26 DIAGNOSIS — K219 Gastro-esophageal reflux disease without esophagitis: Secondary | ICD-10-CM

## 2023-07-26 DIAGNOSIS — I83009 Varicose veins of unspecified lower extremity with ulcer of unspecified site: Secondary | ICD-10-CM

## 2023-07-26 LAB — VAS US ABI WITH/WO TBI

## 2023-07-27 ENCOUNTER — Encounter (INDEPENDENT_AMBULATORY_CARE_PROVIDER_SITE_OTHER): Payer: Self-pay | Admitting: Vascular Surgery

## 2023-07-30 ENCOUNTER — Ambulatory Visit (INDEPENDENT_AMBULATORY_CARE_PROVIDER_SITE_OTHER): Admitting: Podiatry

## 2023-07-30 DIAGNOSIS — M722 Plantar fascial fibromatosis: Secondary | ICD-10-CM | POA: Diagnosis not present

## 2023-07-30 DIAGNOSIS — I890 Lymphedema, not elsewhere classified: Secondary | ICD-10-CM | POA: Diagnosis not present

## 2023-07-30 DIAGNOSIS — G609 Hereditary and idiopathic neuropathy, unspecified: Secondary | ICD-10-CM

## 2023-07-30 DIAGNOSIS — B351 Tinea unguium: Secondary | ICD-10-CM

## 2023-07-30 DIAGNOSIS — M79674 Pain in right toe(s): Secondary | ICD-10-CM | POA: Diagnosis not present

## 2023-07-30 DIAGNOSIS — M79675 Pain in left toe(s): Secondary | ICD-10-CM

## 2023-07-30 NOTE — Progress Notes (Unsigned)
 Chief Complaint  Patient presents with   Nail Problem    Pt stated that she would like her nails trimmed down she stated that she has really bad neuropathy in her feet she is on gabapentin  but it does not help   HPI: 48 y.o. female presents today with several concerns.  Patient states that she is not diabetic.  She has neuropathy pain in both feet.  She states that she recently started taking gabapentin  400 mg 3 times daily.  She states her neuropathy pain is only 4% better since starting this dose around a month ago.  She is wondering if she should switch medications or what her options are.  She has compression wraps on both legs and relates a wound on the right lower extremity.  This is underneath the dressing/compressive wrap.  She is being treated elsewhere for this.  She notes that she has chronic swelling issues of the legs.  She is also having pain in the plantar aspect of the left heel.  Her current shoes are not providing any significant support for the arch and heels.  She also expressed that she would like to treat her fungal nails.  She also request that they be trimmed today.  The right second toenail is the worst and thickest.  Past Medical History:  Diagnosis Date   Depression    Hypertension    Past Surgical History:  Procedure Laterality Date   MANDIBLE SURGERY Right 12/11/1993   Allergies  Allergen Reactions   Benazepril Hcl Hypertension    cough   Benazepril Hcl     cough   Review of Systems  Cardiovascular:  Positive for leg swelling.  Musculoskeletal:        Left plantar heel pain  Skin:        Thick, discolored toenails   Neurological:  Positive for sensory change.     Physical Exam: General: The patient is alert and oriented x3 in no acute distress.  Dermatology: Skin is dry.  Cannot assess the skin on the legs due to the compressive wraps in place.  Nails x 10 are 3 mm thick with yellow discoloration, subungual debris, distal onycholysis, pain with  compression.  The right second toenail is most involved.  Vascular: Trace palpable DP pedal pulses bilaterally.  Cannot assess the PT pulse due to the compressive wraps in place.  Capillary refill within normal limits.  At least +1 edema bilateral legs and feet.  Neurological: Protective sensation intact using a Sem[employer] monofilament.  Vibratory sensation diminished bilateral.  Temperature sensation diminished bilateral.  Musculoskeletal Exam: Ankle dorsiflexion less than 10 degrees with the kneed extended Bilateral.  There is pain on palpation to the plantar aspect of the left heel  Assessment/Plan of Care: 1. Pain due to onychomycosis of toenails of both feet   2. Dermatophytosis of nail   3. Lymphedema   4. Idiopathic peripheral neuropathy   5. Plantar fasciitis of left foot     Discussed clinical findings with patient today.  Will refer the patient to [facility] for custom molded orthotics.  She may need evaluated for shoes that will accommodate her swelling as well.  This is for the plantar fasciitis on the left foot and need for overall foot support.  She will continue with her lymphedema treatments and wound care treatments at an external facility  If the patient only has 4% improvement at the 400 mg 3 times daily dose of gabapentin , and she has been on it for  at least 1 month, it would be reasonable to either double the dosage and have her take 800 mg 3 times a day for another 30 days to see if there is any additional improvement, or switch to a different neuropathy medication.  Informed the patient that many of the neuropathy medications have a side effect that would include leg swelling which would be a concern with her current lymphedema status.  She will plan to discuss this with her PCP at her next visit.  The mycotic nails were debrided x 10 with sterile nail nippers and a power debriding bur.  Clippings of the right second toenail were obtained and sent to [facility] labs for  fungal nail culture.  Will contact patient once we have received this and discuss treatment options.  Follow-up in approximately 3 months for fungal nail recheck  Alandra Sando Jenita Miyamoto, DPM, FACFAS [facility]     [address] [place], [place] [address]                Office [phone]  Fax [phone]

## 2023-08-10 ENCOUNTER — Other Ambulatory Visit: Payer: Self-pay | Admitting: Family Medicine

## 2023-08-10 DIAGNOSIS — E039 Hypothyroidism, unspecified: Secondary | ICD-10-CM

## 2023-08-10 DIAGNOSIS — F324 Major depressive disorder, single episode, in partial remission: Secondary | ICD-10-CM

## 2023-08-13 ENCOUNTER — Telehealth (INDEPENDENT_AMBULATORY_CARE_PROVIDER_SITE_OTHER): Payer: Self-pay

## 2023-08-13 NOTE — Telephone Encounter (Signed)
 [place] with [facility] left a message stating that she saw the patient on yesterday and noticed a blister back calf. The area leaking more than it has been [place] requested if pt should be seen more often or been seen in the office. The measurements are 5 x 4. Verbal orders per Dr Prescilla Brod for pt unna wraps be changed twice weekly with Aquacel were left on home health nurse voicemail.

## 2023-08-14 ENCOUNTER — Other Ambulatory Visit: Payer: Self-pay | Admitting: Podiatry

## 2023-08-17 ENCOUNTER — Telehealth (INDEPENDENT_AMBULATORY_CARE_PROVIDER_SITE_OTHER): Payer: Self-pay

## 2023-08-17 ENCOUNTER — Ambulatory Visit: Admitting: Family Medicine

## 2023-08-17 NOTE — Telephone Encounter (Signed)
 Hannah Orr with [facility] left a message stating that the pt second toe on the right foot skin around the toenail is dark in color. Pt may have hit toe on something. Neosporin was placed around it and noticed the discoloration, unsure is tissue is dying or bruised. Nurse wanted to update provider and patient will contact pt podiatrist.

## 2023-08-20 ENCOUNTER — Ambulatory Visit (INDEPENDENT_AMBULATORY_CARE_PROVIDER_SITE_OTHER): Admitting: Vascular Surgery

## 2023-08-20 ENCOUNTER — Telehealth (INDEPENDENT_AMBULATORY_CARE_PROVIDER_SITE_OTHER): Payer: Self-pay

## 2023-08-20 ENCOUNTER — Encounter (INDEPENDENT_AMBULATORY_CARE_PROVIDER_SITE_OTHER): Payer: Self-pay | Admitting: Vascular Surgery

## 2023-08-20 VITALS — BP 135/76 | HR 89 | Ht 66.0 in | Wt 387.0 lb

## 2023-08-20 DIAGNOSIS — K219 Gastro-esophageal reflux disease without esophagitis: Secondary | ICD-10-CM

## 2023-08-20 DIAGNOSIS — I872 Venous insufficiency (chronic) (peripheral): Secondary | ICD-10-CM

## 2023-08-20 DIAGNOSIS — L97909 Non-pressure chronic ulcer of unspecified part of unspecified lower leg with unspecified severity: Secondary | ICD-10-CM

## 2023-08-20 DIAGNOSIS — I890 Lymphedema, not elsewhere classified: Secondary | ICD-10-CM | POA: Diagnosis not present

## 2023-08-20 DIAGNOSIS — I10 Essential (primary) hypertension: Secondary | ICD-10-CM | POA: Diagnosis not present

## 2023-08-20 DIAGNOSIS — I83009 Varicose veins of unspecified lower extremity with ulcer of unspecified site: Secondary | ICD-10-CM

## 2023-08-20 NOTE — Telephone Encounter (Signed)
 I spoke with Dr Jama about note from triage nurse and he advised for patient to be evaluated at the ED. Patient was notified with medical recommendations and verbalized that she prefer to keep her appt as scheduled for today in the office.

## 2023-08-21 ENCOUNTER — Telehealth: Payer: Self-pay | Admitting: Podiatry

## 2023-08-21 NOTE — Telephone Encounter (Signed)
 Patient is requesting a call to go over the results of the fungal procedure performed at last office visit. Patient has seen them on MyChart but does not understand the result. Thanks

## 2023-08-22 ENCOUNTER — Ambulatory Visit: Payer: Self-pay | Admitting: Podiatry

## 2023-08-22 DIAGNOSIS — B351 Tinea unguium: Secondary | ICD-10-CM

## 2023-08-23 ENCOUNTER — Encounter: Payer: Self-pay | Admitting: Podiatry

## 2023-08-23 ENCOUNTER — Ambulatory Visit (INDEPENDENT_AMBULATORY_CARE_PROVIDER_SITE_OTHER): Admitting: Podiatry

## 2023-08-23 DIAGNOSIS — L600 Ingrowing nail: Secondary | ICD-10-CM | POA: Diagnosis not present

## 2023-08-23 NOTE — Telephone Encounter (Signed)
 Patient responded after the medical assistant reach out to her to go over her fungal nail culture results.  She would like to go ahead and start the oral medication.  She is aware she has to get a hepatic function panel performed first.  The order is written and available in the chart for printing and she can pick this up at the [place] office at her convenience.  Once we reviewed the lab results then we will send in the prescription as long as everything was normal.

## 2023-08-24 ENCOUNTER — Telehealth (INDEPENDENT_AMBULATORY_CARE_PROVIDER_SITE_OTHER): Payer: Self-pay | Admitting: Vascular Surgery

## 2023-08-24 NOTE — Telephone Encounter (Signed)
 Scheduled unna wrap for Monday, 08/27/23. Patient will be calling home health back to request someone come out to rewrap her legs since they are damp so she can get through the weekend.

## 2023-08-24 NOTE — Progress Notes (Signed)
 Subjective:   Patient ID: Hannah Orr, female   DOB: 48 y.o.   MRN: [id number]   HPI Patient presents with severe lymphedema bilateral with obesity that is most likely related to this with a damaged third nail right that is been very tender for her and she cannot take care of it herself   ROS      Objective:  Physical Exam  Patient who is in relatively poor health it was impossible to completely judge circulation that she is wearing Unna boots bilateral but she does have good digital perfusion and she has no breaks in skin.  Her third nailbed right has become very thickened and its partially detached     Assessment:  Difficult patient with a damaged third nail right that she may have done and not realized with underlying dried blood and looseness of the nailbed no proximal edema erythema or drainage noted     Plan:  H&P reviewed I did go ahead today I anesthetized the third digit I discussed the pathology of condition and the ingrown toenail component and I went ahead and I using a sterile instrument I debrided the tissue off the top did not note any drainage and I did not note any other pathology.  She will start soaks in Epsom salts and warm water to the best she can but since it is not draining it is not imperative and I applied a dressing to the area and I discussed if any erythema edema or pathology were to occur she is to reappoint immediately but it should heal uneventfully based on what I saw

## 2023-08-24 NOTE — Telephone Encounter (Signed)
 Pt called stating home health came to wrap her legs today. Wrapping is damp. Has sore and there is leakage. Requesting a nurse to call her ASAP.

## 2023-08-26 ENCOUNTER — Encounter (INDEPENDENT_AMBULATORY_CARE_PROVIDER_SITE_OTHER): Payer: Self-pay | Admitting: Vascular Surgery

## 2023-08-26 NOTE — Progress Notes (Signed)
 MRN : [id number]  Hannah Orr is a 48 y.o. ([birth year]) female who presents with chief complaint of legs hurt and swell.  History of Present Illness:   The patient returns sooner than expected given increased swelling and a dramatic increase in drainage of the right lower extremity in spite of Unna boot therapy.  No fever or chills.  There is some increase in the pain of the right lower extremity.  Current Meds  Medication Sig   albuterol  (VENTOLIN  HFA) 108 (90 Base) MCG/ACT inhaler Inhale 1-2 puffs into the lungs every 6 (six) hours as needed for wheezing or shortness of breath. Please instruct   buPROPion  (WELLBUTRIN  XL) 150 MG 24 hr tablet Take 1 tablet by mouth once daily   carvedilol  (COREG ) 25 MG tablet TAKE 1 TABLET BY MOUTH TWICE DAILY FOR BLOOD PRESSURE   cyanocobalamin  (VITAMIN B12) 1000 MCG tablet Take 1 tablet (1,000 mcg total) by mouth daily.   gabapentin  (NEURONTIN ) 400 MG capsule Take 1 capsule (400 mg total) by mouth 3 (three) times daily.   levothyroxine  (SYNTHROID ) 112 MCG tablet TAKE 1 TABLET BY MOUTH ONCE DAILY BEFORE BREAKFAST   spironolactone  (ALDACTONE ) 50 MG tablet Take 1 tablet (50 mg total) by mouth daily.   Vitamin D , Ergocalciferol , (DRISDOL ) 1.25 MG (50000 UNIT) CAPS capsule Take 1 capsule (50,000 Units total) by mouth every 7 (seven) days.    Past Medical History:  Diagnosis Date   Depression    Hypertension     Past Surgical History:  Procedure Laterality Date   MANDIBLE SURGERY Right 12/11/1993    Social History Social History   Tobacco Use   Smoking status: Never   Smokeless tobacco: Never  Vaping Use   Vaping status: Never Used  Substance Use Topics   Alcohol use: Not Currently    Comment: Stopped drinking December 2021.    Drug use: No    Family History Family History  Problem Relation Age of Onset   Hypertension Mother    Breast cancer Mother    Hypertension Father    Prostate cancer Father    Cancer Neg Hx     Heart disease Neg Hx    Hyperlipidemia Neg Hx    Kidney disease Neg Hx    Diabetes Neg Hx    Early death Neg Hx    Hearing loss Neg Hx    Stroke Neg Hx     Allergies  Allergen Reactions   Benazepril Hcl Hypertension    cough   Benazepril Hcl     cough     REVIEW OF SYSTEMS (Negative unless checked)  Constitutional: [] Weight loss  [] Fever  [] Chills Cardiac: [] Chest pain   [] Chest pressure   [] Palpitations   [] Shortness of breath when laying flat   [] Shortness of breath with exertion. Vascular:  [] Pain in legs with walking   [x] Pain in legs at rest  [] History of DVT   [] Phlebitis   [x] Swelling in legs   [] Varicose veins   [x] Non-healing ulcers Pulmonary:   [] Uses home oxygen   [] Productive cough   [] Hemoptysis   [] Wheeze  [] COPD   [] Asthma Neurologic:  [] Dizziness   [] Seizures   [] History of stroke   [] History of TIA  [] Aphasia   [] Vissual changes   [] Weakness or numbness in arm   [] Weakness or numbness in leg Musculoskeletal:   [] Joint swelling   [x] Joint pain   [x] Low back pain Hematologic:  [] Easy bruising  [] Easy bleeding   []   Hypercoagulable state   [] Anemic Gastrointestinal:  [] Diarrhea   [] Vomiting  [x] Gastroesophageal reflux/heartburn   [] Difficulty swallowing. Genitourinary:  [] Chronic kidney disease   [] Difficult urination  [] Frequent urination   [] Blood in urine Skin:  [] Rashes   [] Ulcers  Psychological:  [] History of anxiety   []  History of major depression.  Physical Examination  Vitals:   08/20/23 1313  BP: 135/76  Pulse: 89  Weight: (!) 387 lb (175.5 kg)  Height: 5' 6 (1.676 m)   Body mass index is 62.46 kg/m. Gen: WD/WN, NAD Head: [place]/AT, No temporalis wasting.  Ear/Nose/Throat: Hearing grossly intact, nares w/o erythema or drainage, pinna without lesions Eyes: PER, EOMI, sclera nonicteric.  Neck: Supple, no gross masses.  No JVD.  Pulmonary:  Good air movement, no audible wheezing, no use of accessory muscles.  Cardiac: RRR, precordium not  hyperdynamic. Vascular:  scattered varicosities present bilaterally.  There are open areas on the right which are weeping copious clear fluid.  There is mild erythema.  There are severe venous stasis changes to the legs bilaterally.  4+ soft pitting edema. CEAP [id number]   Vessel Right Left  Radial Palpable Palpable  Gastrointestinal: soft, non-distended. No guarding/no peritoneal signs.  Musculoskeletal: M/S 5/5 throughout.  No deformity.  Neurologic: CN 2-12 intact. Pain and light touch intact in extremities.  Symmetrical.  Speech is fluent. Motor exam as listed above. Psychiatric: Judgment intact, Mood & affect appropriate for pt's clinical situation. Dermatologic: Venous rashes no ulcers noted.  No changes consistent with cellulitis. Lymph : No lichenification or skin changes of chronic lymphedema.  CBC Lab Results  Component Value Date   WBC 2.7 (L) 07/17/2022   HGB 12.4 07/17/2022   HCT 37.3 07/17/2022   MCV 113.3 Repeated and verified X2. (H) 07/17/2022   PLT 293.0 07/17/2022    BMET    Component Value Date/Time   NA 133 (L) 05/09/2021 1152   NA 134 03/25/2021 1412   K 3.9 05/09/2021 1152   CL 96 05/09/2021 1152   CO2 22 05/09/2021 1152   GLUCOSE 95 05/09/2021 1152   BUN 10 05/09/2021 1152   BUN 9 03/25/2021 1412   CREATININE 0.79 05/09/2021 1152   CREATININE 0.75 07/28/2020 1205   CALCIUM 9.5 05/09/2021 1152   GFRNONAA >60 07/28/2020 1205   CrCl cannot be calculated (Patient's most recent lab result is older than the maximum 21 days allowed.).  COAG No results found for: INR, PROTIME  Radiology No results found.   Assessment/Plan 1. Venous ulcer (HCC) (Primary) The patient's venous ulcer seems to have worsened with Unna boot therapy.  I have recommended that she go to the emergency room with the plan for admission IV antibiotics and bed rest.  She is opposed to this at this time and wishes to try to continue Unna wraps and more elevation at home.  She will  follow-up with me in 2 weeks.  2. Lymphedema See #1  3. Essential hypertension Continue antihypertensive medications as already ordered, these medications have been reviewed and there are no changes at this time.  4. Venous insufficiency See #1  5. Gastroesophageal reflux disease, unspecified whether esophagitis present Continue PPI as already ordered, this medication has been reviewed and there are no changes at this time.  Avoidence of caffeine and alcohol  Moderate elevation of the head of the bed     Cordella Shawl, MD  08/26/2023 1:04 PM

## 2023-08-27 ENCOUNTER — Other Ambulatory Visit: Payer: Self-pay

## 2023-08-27 ENCOUNTER — Emergency Department

## 2023-08-27 ENCOUNTER — Encounter: Payer: Self-pay | Admitting: Emergency Medicine

## 2023-08-27 ENCOUNTER — Encounter (INDEPENDENT_AMBULATORY_CARE_PROVIDER_SITE_OTHER)

## 2023-08-27 ENCOUNTER — Emergency Department
Admission: EM | Admit: 2023-08-27 | Discharge: 2023-08-27 | Disposition: A | Discharge status: Home / Self Care | Source: Ambulatory Visit | Attending: Emergency Medicine | Admitting: Emergency Medicine

## 2023-08-27 DIAGNOSIS — I10 Essential (primary) hypertension: Secondary | ICD-10-CM | POA: Diagnosis not present

## 2023-08-27 DIAGNOSIS — N179 Acute kidney failure, unspecified: Secondary | ICD-10-CM | POA: Diagnosis not present

## 2023-08-27 DIAGNOSIS — D649 Anemia, unspecified: Secondary | ICD-10-CM | POA: Insufficient documentation

## 2023-08-27 DIAGNOSIS — R7989 Other specified abnormal findings of blood chemistry: Secondary | ICD-10-CM | POA: Diagnosis not present

## 2023-08-27 DIAGNOSIS — D72819 Decreased white blood cell count, unspecified: Secondary | ICD-10-CM | POA: Insufficient documentation

## 2023-08-27 DIAGNOSIS — R600 Localized edema: Secondary | ICD-10-CM | POA: Diagnosis not present

## 2023-08-27 DIAGNOSIS — I890 Lymphedema, not elsewhere classified: Secondary | ICD-10-CM | POA: Insufficient documentation

## 2023-08-27 DIAGNOSIS — I872 Venous insufficiency (chronic) (peripheral): Secondary | ICD-10-CM | POA: Diagnosis not present

## 2023-08-27 DIAGNOSIS — L03115 Cellulitis of right lower limb: Secondary | ICD-10-CM | POA: Diagnosis present

## 2023-08-27 LAB — BASIC METABOLIC PANEL WITH GFR
Anion gap: 6 (ref 5–15)
BUN: 23 mg/dL — ABNORMAL HIGH (ref 6–20)
CO2: 24 mmol/L (ref 22–32)
Calcium: 8.5 mg/dL — ABNORMAL LOW (ref 8.9–10.3)
Chloride: 106 mmol/L (ref 98–111)
Creatinine, Ser: 1.1 mg/dL — ABNORMAL HIGH (ref 0.44–1.00)
GFR, Estimated: >60 mL/min (ref >60)
Glucose, Bld: 92 mg/dL (ref 70–99)
Potassium: 4.8 mmol/L (ref 3.5–5.1)
Sodium: 136 mmol/L (ref 135–145)

## 2023-08-27 LAB — LACTIC ACID, PLASMA
Lactic Acid, Venous: 3 mmol/L (ref 0.5–1.9)
Lactic Acid, Venous: 1.1 mmol/L (ref 0.5–1.9)

## 2023-08-27 LAB — CBC WITH DIFFERENTIAL/PLATELET
Abs Immature Granulocytes: 0.01 10*3/uL (ref 0.00–0.07)
Basophils Absolute: 0 10*3/uL (ref 0.0–0.1)
Basophils Relative: 1 %
Eosinophils Absolute: 0.2 10*3/uL (ref 0.0–0.5)
Eosinophils Relative: 5 %
HCT: 33.6 % — ABNORMAL LOW (ref 36.0–46.0)
Hemoglobin: 10.6 g/dL — ABNORMAL LOW (ref 12.0–15.0)
Immature Granulocytes: 0 %
Lymphocytes Relative: 41 %
Lymphs Abs: 1.3 10*3/uL (ref 0.7–4.0)
MCH: 35.7 pg — ABNORMAL HIGH (ref 26.0–34.0)
MCHC: 31.5 g/dL (ref 30.0–36.0)
MCV: 113.1 fL — ABNORMAL HIGH (ref 80.0–100.0)
Monocytes Absolute: 0.5 10*3/uL (ref 0.1–1.0)
Monocytes Relative: 16 %
Neutro Abs: 1.2 10*3/uL — ABNORMAL LOW (ref 1.7–7.7)
Neutrophils Relative %: 37 %
Platelets: 305 10*3/uL (ref 150–400)
RBC: 2.97 MIL/uL — ABNORMAL LOW (ref 3.87–5.11)
RDW: 14 % (ref 11.5–15.5)
Smear Review: NORMAL
WBC: 3.3 10*3/uL — ABNORMAL LOW (ref 4.0–10.5)
nRBC: 0 % (ref 0.0–0.2)

## 2023-08-27 LAB — COMPREHENSIVE METABOLIC PANEL WITH GFR
ALT: 12 U/L (ref 0–44)
AST: 24 U/L (ref 15–41)
Albumin: 3.8 g/dL (ref 3.5–5.0)
Alkaline Phosphatase: 48 U/L (ref 38–126)
Anion gap: 9 (ref 5–15)
BUN: 24 mg/dL — ABNORMAL HIGH (ref 6–20)
CO2: 24 mmol/L (ref 22–32)
Calcium: 9.3 mg/dL (ref 8.9–10.3)
Chloride: 103 mmol/L (ref 98–111)
Creatinine, Ser: 1.23 mg/dL — ABNORMAL HIGH (ref 0.44–1.00)
GFR, Estimated: 54 mL/min — ABNORMAL LOW (ref >60)
Glucose, Bld: 101 mg/dL — ABNORMAL HIGH (ref 70–99)
Potassium: 5.3 mmol/L — ABNORMAL HIGH (ref 3.5–5.1)
Sodium: 136 mmol/L (ref 135–145)
Total Bilirubin: 0.6 mg/dL (ref 0.0–1.2)
Total Protein: 9 g/dL — ABNORMAL HIGH (ref 6.5–8.1)

## 2023-08-27 LAB — BRAIN NATRIURETIC PEPTIDE: B Natriuretic Peptide: 8.8 pg/mL (ref 0.0–100.0)

## 2023-08-27 MED ORDER — SODIUM CHLORIDE 0.9 % IV SOLN
2.0000 g | Freq: Once | INTRAVENOUS | Status: AC
  Administered 2023-08-27: 2 g via INTRAVENOUS
  Filled 2023-08-27: qty 20

## 2023-08-27 MED ORDER — CEPHALEXIN 500 MG PO CAPS: 500.0000 mg | ORAL_CAPSULE | Freq: Four times a day (QID) | ORAL | 0 refills | Status: AC

## 2023-08-27 MED ORDER — SODIUM CHLORIDE 0.9 % IV BOLUS
1000.0000 mL | Freq: Once | INTRAVENOUS | Status: AC
  Administered 2023-08-27: 1000 mL via INTRAVENOUS

## 2023-08-27 NOTE — Discharge Instructions (Addendum)
 Take the antibiotic as prescribed.  Follow-up with your primary care doctor and vascular specialist.  Return to the ER for new, worsening, or persistent severe pain, redness, increasing weeping or fluid drainage, fever or chills, pain going up the leg, or any other new or worsening symptoms that concern you.

## 2023-08-27 NOTE — ED Notes (Signed)
 Bilateral legs rewrapped

## 2023-08-27 NOTE — Telephone Encounter (Signed)
 I spoke with Dr Jama and he advised for the patient to be seen in the ED for further evaluation. Patient verbalized understanding to medical advice

## 2023-08-27 NOTE — ED Triage Notes (Signed)
 Patient to ED via POV for right leg wound. PT reports she has been seeing vascular for wound to leg. Pt reports drainage to leg. Leg wrapped on arrival. Ambulatory with canes.

## 2023-08-27 NOTE — ED Provider Notes (Signed)
 [facility] Provider Note    Event Date/Time   First MD Initiated Contact with Patient 08/27/23 1120     (approximate)   History   Wound Check   HPI  Hannah Orr is a 48 y.o. female past medical history significant for lymphedema, hypertension, chronic venous insufficiency, acid reflux, venous ulcer, who presents to the emergency department for wound check.  Patient states that she has been followed by Dr. Jama with vascular surgery     Physical Exam   Triage Vital Signs: ED Triage Vitals  Encounter Vitals Group     BP 08/27/23 1017 (!) 167/91     Girls Systolic BP Percentile --      Girls Diastolic BP Percentile --      Boys Systolic BP Percentile --      Boys Diastolic BP Percentile --      Pulse Rate 08/27/23 1017 84     Resp 08/27/23 1017 20     Temp 08/27/23 1017 98.2 F (36.8 C)     Temp Source 08/27/23 1017 Oral     SpO2 08/27/23 1017 93 %     Weight 08/27/23 1029 (!) 387 lb (175.5 kg)     Height 08/27/23 1029 5' 4 (1.626 m)     Head Circumference --      Peak Flow --      Pain Score 08/27/23 1029 0     Pain Loc --      Pain Education --      Exclude from Growth Chart --     Most recent vital signs: Vitals:   08/27/23 1017 08/27/23 1505  BP: (!) 167/91   Pulse: 84   Resp: 20   Temp: 98.2 F (36.8 C) 98.3 F (36.8 C)  SpO2: 93%     Physical Exam Constitutional:      Appearance: She is well-developed. She is obese.  HENT:     Head: Atraumatic.   Eyes:     Conjunctiva/sclera: Conjunctivae normal.    Cardiovascular:     Rate and Rhythm: Regular rhythm.  Pulmonary:     Effort: No respiratory distress.  Abdominal:     General: There is no distension.   Musculoskeletal:        General: Normal range of motion.     Cervical back: Normal range of motion.     Right lower leg: Edema present.     Left lower leg: Edema present.     Comments: Significant pitting edema to bilateral lower extremities.  Mild  erythema to the right lower extremity with no warmth.  2 areas with venous stasis ulcer.  No purulent drainage or fluctuance.  No crepitance.   Skin:    General: Skin is warm.   Neurological:     Mental Status: She is alert. Mental status is at baseline.     IMPRESSION / MDM / ASSESSMENT AND PLAN / ED COURSE  I reviewed the triage vital signs and the nursing notes.  Differential diagnosis including DVT, heart failure exacerbation, cellulitis, chronic venous stasis, lymphedema  EKG  I, Clotilda Punter, the attending physician, personally viewed and interpreted this ECG.   Rate: Normal  Rhythm: Normal sinus  Axis: Normal  Intervals: Normal  ST&T Change: None  No tachycardic or bradycardic dysrhythmias while on cardiac telemetry.  RADIOLOGY I independently reviewed imaging, my interpretation of imaging: Chest x-ray with cardiomegaly but no signs of pulmonary edema  LABS (all labs ordered are listed, but  only abnormal results are displayed) Labs interpreted as -    Labs Reviewed  LACTIC ACID, PLASMA - Abnormal; Notable for the following components:      Result Value   Lactic Acid, Venous 3.0 (*)    All other components within normal limits  COMPREHENSIVE METABOLIC PANEL WITH GFR - Abnormal; Notable for the following components:   Potassium 5.3 (*)    Glucose, Bld 101 (*)    BUN 24 (*)    Creatinine, Ser 1.23 (*)    Total Protein 9.0 (*)    GFR, Estimated 54 (*)    All other components within normal limits  CBC WITH DIFFERENTIAL/PLATELET - Abnormal; Notable for the following components:   WBC 3.3 (*)    RBC 2.97 (*)    Hemoglobin 10.6 (*)    HCT 33.6 (*)    MCV 113.1 (*)    MCH 35.7 (*)    Neutro Abs 1.2 (*)    All other components within normal limits  BASIC METABOLIC PANEL WITH GFR - Abnormal; Notable for the following components:   BUN 23 (*)    Creatinine, Ser 1.10 (*)    Calcium 8.5 (*)    All other components within normal limits  LACTIC ACID, PLASMA   BRAIN NATRIURETIC PEPTIDE     MDM  Patient has chronic leukopenia.  Anemia but no signs or symptoms consistent with a GI bleed, does appear microcytic, does state that she was following this with her primary care provider.  Normal platelets.  Mild acute kidney injury with creatinine elevated at 1.2 from a baseline of 0.8 with mild elevation of BUN and potassium level that was elevated.  Initial lactic acid was 3.  Given 1 L of IV fluids and repeat lab work obtained.  Lactic acid normalized at 1.  Did have some improvement of her kidney function with a creatinine of 1.1 and a BUN of 23.  Potassium is normal at 4.8.  BNP within normal limits.  Low suspicion for heart failure exacerbation.  DVT us  pending. Messaged vascular surgery Dr Dreama for any other further recommendations.  Patient wants to go home.  Plan to discharge the patient home with Keflex , compression socks and elevating her legs if no other further recommendations from vascular surgery.  Ultrasound DVT is currently pending.  Care transferred to incoming provider.     PROCEDURES:  Critical Care performed: No  Procedures  Patient's presentation is most consistent with acute presentation with potential threat to life or bodily function.   MEDICATIONS ORDERED IN ED: Medications  sodium chloride 0.9 % bolus 1,000 mL (0 mLs Intravenous Stopped 08/27/23 1359)  cefTRIAXone (ROCEPHIN) 2 g in sodium chloride 0.9 % 100 mL IVPB (0 g Intravenous Stopped 08/27/23 1358)    FINAL CLINICAL IMPRESSION(S) / ED DIAGNOSES   Final diagnoses:  Cellulitis of right lower extremity  Lymphedema     Rx / DC Orders   ED Discharge Orders     None        Note:  This document was prepared using Dragon voice recognition software and may include unintentional dictation errors.   Suzanne Kirsch, MD 08/27/23 [phone]

## 2023-08-27 NOTE — ED Provider Notes (Signed)
-----------------------------------------   4:30 PM on 08/27/2023 -----------------------------------------  I took over care of this patient from Dr. Suzanne.  The ultrasound is negative:  IMPRESSION:  No evidence of deep venous thrombosis in either lower extremity.   Dr. Suzanne discussed with Dr. Jama who advised that is reasonable to try the patient on another course of oral antibiotics.  The patient herself would prefer to go home.  She has gotten ceftriaxone in the ED, and I have prescribed a 1 week course of Keflex .  I counseled the patient on the results of the workup.  I gave strict return precautions, she expressed understanding.  She is stable for discharge at this time.   Jacolyn Pae, MD 08/27/23 [phone]

## 2023-08-28 ENCOUNTER — Encounter (INDEPENDENT_AMBULATORY_CARE_PROVIDER_SITE_OTHER): Payer: Self-pay

## 2023-08-28 ENCOUNTER — Ambulatory Visit (INDEPENDENT_AMBULATORY_CARE_PROVIDER_SITE_OTHER): Admitting: Nurse Practitioner

## 2023-08-28 VITALS — BP 159/94 | HR 86 | Resp 18

## 2023-08-28 DIAGNOSIS — I890 Lymphedema, not elsewhere classified: Secondary | ICD-10-CM | POA: Diagnosis not present

## 2023-08-28 NOTE — Progress Notes (Signed)
 History of Present Illness  There is no documented history at this time  Assessments & Plan   There are no diagnoses linked to this encounter.    Additional instructions  Subjective:  Patient presents with venous ulcer of the Right lower extremity.    Procedure:  3 layer unna wrap was placed Right lower extremity.   Plan:   Follow up in one week.

## 2023-09-06 ENCOUNTER — Encounter: Payer: Self-pay | Admitting: Family Medicine

## 2023-09-06 ENCOUNTER — Ambulatory Visit (INDEPENDENT_AMBULATORY_CARE_PROVIDER_SITE_OTHER): Admitting: Family Medicine

## 2023-09-06 VITALS — BP 144/86 | HR 84 | Temp 97.6°F | Ht 64.0 in | Wt 387.0 lb

## 2023-09-06 DIAGNOSIS — E559 Vitamin D deficiency, unspecified: Secondary | ICD-10-CM

## 2023-09-06 DIAGNOSIS — E039 Hypothyroidism, unspecified: Secondary | ICD-10-CM

## 2023-09-06 DIAGNOSIS — G629 Polyneuropathy, unspecified: Secondary | ICD-10-CM | POA: Diagnosis not present

## 2023-09-06 DIAGNOSIS — F324 Major depressive disorder, single episode, in partial remission: Secondary | ICD-10-CM

## 2023-09-06 DIAGNOSIS — E538 Deficiency of other specified B group vitamins: Secondary | ICD-10-CM | POA: Diagnosis not present

## 2023-09-06 DIAGNOSIS — Z23 Encounter for immunization: Secondary | ICD-10-CM | POA: Diagnosis not present

## 2023-09-06 DIAGNOSIS — Z6841 Body Mass Index (BMI) 40.0 and over, adult: Secondary | ICD-10-CM

## 2023-09-06 MED ORDER — VITAMIN B-12 1000 MCG PO TABS: 1000.0000 ug | ORAL_TABLET | Freq: Every day | ORAL | 5 refills | Status: AC

## 2023-09-06 MED ORDER — GABAPENTIN 400 MG PO CAPS: 400.0000 mg | ORAL_CAPSULE | Freq: Three times a day (TID) | ORAL | 3 refills | Status: DC

## 2023-09-06 NOTE — Progress Notes (Signed)
 Established Patient Office Visit   Subjective:  Patient ID: Hannah Orr, female    DOB: 16-May-1975  Age: 48 y.o. MRN: [id number]  Chief Complaint  Patient presents with   Follow-up    6 month f/u OSA.     HPI Encounter Diagnoses  Name Primary?   Acquired hypothyroidism Yes   Neuropathy    B12 deficiency    Immunization due    Depression, major, single episode, in partial remission (HCC)    Vitamin D  deficiency    Morbid obesity with BMI of 60.0-69.9, adult (HCC)    For follow-up of above.  Confirms compliance with her levothyroxine  112 mcg taken 30 minutes before eating in the morning.  Confirms compliance with cyanocobalamin  1000 mcg daily and 50,000 units of vitamin D  weekly.  Continues fluoxetine  for depression.  Continues to struggle with venous insufficiency, lymphedema and periodic cellulitis of her lower extremities.  Exercise has been difficult for her.  She does have regular follow-up for dental care.  She lives at home with her mom who is deaf.  Last Pap smear was 2 years ago.   Review of Systems  Constitutional: Negative.   HENT: Negative.    Eyes:  Negative for blurred vision, discharge and redness.  Respiratory: Negative.    Cardiovascular: Negative.   Gastrointestinal:  Negative for abdominal pain.  Genitourinary: Negative.   Musculoskeletal: Negative.  Negative for myalgias.  Skin:  Negative for rash.  Neurological:  Negative for tingling, loss of consciousness and weakness.  Endo/Heme/Allergies:  Negative for polydipsia.      09/06/2023    2:34 PM 07/17/2022    1:48 PM 06/24/2021   11:23 AM  Depression screen PHQ 2/9  Decreased Interest 0 0 0  Down, Depressed, Hopeless 1 0 0  PHQ - 2 Score 1 0 0  Altered sleeping 0    Tired, decreased energy 0    Change in appetite 0    Feeling bad or failure about yourself  0    Trouble concentrating 0    Moving slowly or fidgety/restless 0    Suicidal thoughts 0    PHQ-9 Score 1    Difficult doing  work/chores Not difficult at all        Current Outpatient Medications:    albuterol  (VENTOLIN  HFA) 108 (90 Base) MCG/ACT inhaler, Inhale 1-2 puffs into the lungs every 6 (six) hours as needed for wheezing or shortness of breath. Please instruct, Disp: 8 g, Rfl: 2   buPROPion  (WELLBUTRIN  XL) 150 MG 24 hr tablet, Take 1 tablet by mouth once daily, Disp: 30 tablet, Rfl: 0   carvedilol  (COREG ) 25 MG tablet, TAKE 1 TABLET BY MOUTH TWICE DAILY FOR BLOOD PRESSURE, Disp: 180 tablet, Rfl: 3   FLUoxetine  (PROZAC ) 40 MG capsule, Take 1 capsule (40 mg total) by mouth daily., Disp: 90 capsule, Rfl: 1   levothyroxine  (SYNTHROID ) 112 MCG tablet, TAKE 1 TABLET BY MOUTH ONCE DAILY BEFORE BREAKFAST, Disp: 90 tablet, Rfl: 0   spironolactone  (ALDACTONE ) 50 MG tablet, Take 1 tablet (50 mg total) by mouth daily., Disp: 90 tablet, Rfl: 3   Torsemide  60 MG TABS, Take 60 mg by mouth daily., Disp: 90 tablet, Rfl: 3   Vitamin D , Ergocalciferol , (DRISDOL ) 1.25 MG (50000 UNIT) CAPS capsule, Take 1 capsule (50,000 Units total) by mouth every 7 (seven) days., Disp: 15 capsule, Rfl: 1   cyanocobalamin  (VITAMIN B12) 1000 MCG tablet, Take 1 tablet (1,000 mcg total) by mouth daily., Disp: 90 tablet, Rfl:  5   gabapentin  (NEURONTIN ) 400 MG capsule, Take 1 capsule (400 mg total) by mouth 3 (three) times daily., Disp: 270 capsule, Rfl: 3   Objective:     BP (!) 144/86   Pulse 84   Temp 97.6 F (36.4 C) (Temporal)   Ht 5' 4 (1.626 m)   Wt (!) 387 lb (175.5 kg)   SpO2 96%   BMI 66.43 kg/m  Wt Readings from Last 3 Encounters:  09/06/23 (!) 387 lb (175.5 kg)  08/27/23 (!) 387 lb (175.5 kg)  08/20/23 (!) 387 lb (175.5 kg)      Physical Exam Constitutional:      General: She is not in acute distress.    Appearance: Normal appearance. She is obese. She is not ill-appearing, toxic-appearing or diaphoretic.  HENT:     Head: Normocephalic and atraumatic.     Right Ear: External ear normal.     Left Ear: External ear  normal.     Mouth/Throat:     Mouth: Mucous membranes are moist.     Pharynx: Oropharynx is clear. No oropharyngeal exudate or posterior oropharyngeal erythema.  Eyes:     General: No scleral icterus.       Right eye: No discharge.        Left eye: No discharge.     Extraocular Movements: Extraocular movements intact.     Conjunctiva/sclera: Conjunctivae normal.     Pupils: Pupils are equal, round, and reactive to light.  Cardiovascular:     Rate and Rhythm: Normal rate and regular rhythm.  Pulmonary:     Effort: Pulmonary effort is normal. No respiratory distress.     Breath sounds: Normal breath sounds. No wheezing or rales.  Abdominal:     General: Bowel sounds are normal.  Musculoskeletal:     Cervical back: No rigidity or tenderness.  Lymphadenopathy:     Cervical: No cervical adenopathy.  Skin:    General: Skin is warm and dry.  Neurological:     Mental Status: She is alert and oriented to person, place, and time.  Psychiatric:        Mood and Affect: Mood normal.        Behavior: Behavior normal.      No results found for any visits on 09/06/23.    The 10-year ASCVD risk score (Arnett DK, et al., 2019) is: 3.2%    Assessment & Plan:   Acquired hypothyroidism -     TSH  Neuropathy -     Vitamin B-12; Take 1 tablet (1,000 mcg total) by mouth daily.  Dispense: 90 tablet; Refill: 5 -     Gabapentin ; Take 1 capsule (400 mg total) by mouth 3 (three) times daily.  Dispense: 270 capsule; Refill: 3  B12 deficiency -     Vitamin B-12; Take 1 tablet (1,000 mcg total) by mouth daily.  Dispense: 90 tablet; Refill: 5 -     Vitamin B12  Immunization due -     Pneumococcal conjugate vaccine 20-valent  Depression, major, single episode, in partial remission (HCC)  Vitamin D  deficiency -     VITAMIN D  25 Hydroxy (Vit-D Deficiency, Fractures)  Morbid obesity with BMI of 60.0-69.9, adult (HCC) -     Amb Ref to Medical Weight Management    Return in about 6 months  (around 03/08/2024).    Elsie Sim Lent, MD

## 2023-09-07 ENCOUNTER — Ambulatory Visit: Payer: Self-pay | Admitting: Family Medicine

## 2023-09-07 LAB — VITAMIN B12: Vitamin B-12: 367 pg/mL (ref 211–911)

## 2023-09-07 LAB — VITAMIN D 25 HYDROXY (VIT D DEFICIENCY, FRACTURES): VITD: 19.02 ng/mL — ABNORMAL LOW (ref 30.00–100.00)

## 2023-09-07 LAB — TSH: TSH: 3.59 u[IU]/mL (ref 0.35–5.50)

## 2023-09-10 ENCOUNTER — Ambulatory Visit (INDEPENDENT_AMBULATORY_CARE_PROVIDER_SITE_OTHER): Admitting: Vascular Surgery

## 2023-09-10 ENCOUNTER — Encounter (INDEPENDENT_AMBULATORY_CARE_PROVIDER_SITE_OTHER): Payer: Self-pay

## 2023-09-10 ENCOUNTER — Encounter (INDEPENDENT_AMBULATORY_CARE_PROVIDER_SITE_OTHER): Payer: Self-pay | Admitting: Vascular Surgery

## 2023-09-10 VITALS — BP 150/91 | HR 85 | Resp 18 | Ht 65.0 in | Wt 357.0 lb

## 2023-09-10 DIAGNOSIS — I83009 Varicose veins of unspecified lower extremity with ulcer of unspecified site: Secondary | ICD-10-CM

## 2023-09-10 DIAGNOSIS — I872 Venous insufficiency (chronic) (peripheral): Secondary | ICD-10-CM

## 2023-09-10 DIAGNOSIS — J4521 Mild intermittent asthma with (acute) exacerbation: Secondary | ICD-10-CM

## 2023-09-10 DIAGNOSIS — L97909 Non-pressure chronic ulcer of unspecified part of unspecified lower leg with unspecified severity: Secondary | ICD-10-CM

## 2023-09-10 DIAGNOSIS — I10 Essential (primary) hypertension: Secondary | ICD-10-CM | POA: Diagnosis not present

## 2023-09-15 ENCOUNTER — Encounter (INDEPENDENT_AMBULATORY_CARE_PROVIDER_SITE_OTHER): Payer: Self-pay | Admitting: Vascular Surgery

## 2023-09-15 NOTE — Progress Notes (Signed)
 MRN : [id number]  Hannah Orr is a 48 y.o. (08-12-1975) female who presents with chief complaint of legs hurt and swell.  History of Present Illness:   Patient returns today she continues to have problems with drainage in spite of the [employer].  She continues to have pain itching and burning from her ulceration.  Overall she is quite frustrated that things are not getting better.  She did go to the emergency room where she was given a dose of IV antibiotic and then a prescription for oral antibiotics.  She denies fevers or shaking chills  Current Meds  Medication Sig   albuterol  (VENTOLIN  HFA) 108 (90 Base) MCG/ACT inhaler Inhale 1-2 puffs into the lungs every 6 (six) hours as needed for wheezing or shortness of breath. Please instruct   buPROPion  (WELLBUTRIN  XL) 150 MG 24 hr tablet Take 1 tablet by mouth once daily   carvedilol  (COREG ) 25 MG tablet TAKE 1 TABLET BY MOUTH TWICE DAILY FOR BLOOD PRESSURE   cyanocobalamin  (VITAMIN B12) 1000 MCG tablet Take 1 tablet (1,000 mcg total) by mouth daily.   FLUoxetine  (PROZAC ) 40 MG capsule Take 1 capsule (40 mg total) by mouth daily.   gabapentin  (NEURONTIN ) 400 MG capsule Take 1 capsule (400 mg total) by mouth 3 (three) times daily.   levothyroxine  (SYNTHROID ) 112 MCG tablet TAKE 1 TABLET BY MOUTH ONCE DAILY BEFORE BREAKFAST   spironolactone  (ALDACTONE ) 50 MG tablet Take 1 tablet (50 mg total) by mouth daily.   Torsemide  60 MG TABS Take 60 mg by mouth daily.   Vitamin D , Ergocalciferol , (DRISDOL ) 1.25 MG (50000 UNIT) CAPS capsule Take 1 capsule (50,000 Units total) by mouth every 7 (seven) days.    Past Medical History:  Diagnosis Date   Depression    Diabetes mellitus without complication (HCC)    Hypertension     Past Surgical History:  Procedure Laterality Date   MANDIBLE SURGERY Right 12/11/1993    Social History Social History   Tobacco Use   Smoking status: Never   Smokeless tobacco: Never  Vaping Use   Vaping  status: Never Used  Substance Use Topics   Alcohol use: Not Currently    Comment: Stopped drinking December 2021.    Drug use: No    Family History Family History  Problem Relation Age of Onset   Hypertension Mother    Breast cancer Mother    Diabetes Mother    Hearing loss Mother    Hypertension Father    Prostate cancer Father    Cancer Father    Heart disease Neg Hx    Hyperlipidemia Neg Hx    Kidney disease Neg Hx    Early death Neg Hx    Stroke Neg Hx     Allergies  Allergen Reactions   Benazepril Hcl Hypertension    cough   Benazepril Hcl     cough     REVIEW OF SYSTEMS (Negative unless checked)  Constitutional: [] Weight loss  [] Fever  [] Chills Cardiac: [] Chest pain   [] Chest pressure   [] Palpitations   [] Shortness of breath when laying flat   [] Shortness of breath with exertion. Vascular:  [] Pain in legs with walking   [x] Pain in legs at rest  [] History of DVT   [] Phlebitis   [x] Swelling in legs   [] Varicose veins   [] Non-healing ulcers Pulmonary:   [] Uses home oxygen   [] Productive cough   [] Hemoptysis   [] Wheeze  [] COPD   []   Asthma Neurologic:  [] Dizziness   [] Seizures   [] History of stroke   [] History of TIA  [] Aphasia   [] Vissual changes   [] Weakness or numbness in arm   [] Weakness or numbness in leg Musculoskeletal:   [] Joint swelling   [] Joint pain   [] Low back pain Hematologic:  [] Easy bruising  [] Easy bleeding   [] Hypercoagulable state   [] Anemic Gastrointestinal:  [] Diarrhea   [] Vomiting  [] Gastroesophageal reflux/heartburn   [] Difficulty swallowing. Genitourinary:  [] Chronic kidney disease   [] Difficult urination  [] Frequent urination   [] Blood in urine Skin:  [] Rashes   [] Ulcers  Psychological:  [] History of anxiety   []  History of major depression.  Physical Examination  Vitals:   09/10/23 1121  BP: (!) 150/91  Pulse: 85  Resp: 18  Weight: (!) 357 lb (161.9 kg)  Height: 5' 5 (1.651 m)   Body mass index is 59.41 kg/m. Gen: WD/WN, NAD Head:  [place]/AT, No temporalis wasting.  Ear/Nose/Throat: Hearing grossly intact, nares w/o erythema or drainage, pinna without lesions Eyes: PER, EOMI, sclera nonicteric.  Neck: Supple, no gross masses.  No JVD.  Pulmonary:  Good air movement, no audible wheezing, no use of accessory muscles.  Cardiac: RRR, precordium not hyperdynamic. Vascular:  scattered varicosities present bilaterally.  Open wound right calf severe venous stasis changes to the legs bilaterally.  4+ soft pitting edema. CEAP [id number]   Vessel Right Left  Radial Palpable Palpable  Gastrointestinal: soft, non-distended. No guarding/no peritoneal signs.  Musculoskeletal: M/S 5/5 throughout.  No deformity.  Neurologic: CN 2-12 intact. Pain and light touch intact in extremities.  Symmetrical.  Speech is fluent. Motor exam as listed above. Psychiatric: Judgment intact, Mood & affect appropriate for pt's clinical situation. Dermatologic: Venous rashes no ulcers noted.  No changes consistent with cellulitis. Lymph : No lichenification or skin changes of chronic lymphedema.  CBC Lab Results  Component Value Date   WBC 3.3 (L) 08/27/2023   HGB 10.6 (L) 08/27/2023   HCT 33.6 (L) 08/27/2023   MCV 113.1 (H) 08/27/2023   PLT 305 08/27/2023    BMET    Component Value Date/Time   NA 136 08/27/2023 1409   NA 134 03/25/2021 1412   K 4.8 08/27/2023 1409   CL 106 08/27/2023 1409   CO2 24 08/27/2023 1409   GLUCOSE 92 08/27/2023 1409   BUN 23 (H) 08/27/2023 1409   BUN 9 03/25/2021 1412   CREATININE 1.10 (H) 08/27/2023 1409   CREATININE 0.75 07/28/2020 1205   CALCIUM 8.5 (L) 08/27/2023 1409   GFRNONAA >60 08/27/2023 1409   GFRNONAA >60 07/28/2020 1205   Estimated Creatinine Clearance: 97.8 mL/min (A) (by C-G formula based on SCr of 1.1 mg/dL (H)).  COAG No results found for: INR, PROTIME  Radiology US  Venous Img Lower Bilateral Result Date: 08/27/2023 CLINICAL DATA:  Bilateral lower extremity edema since last October EXAM:  BILATERAL LOWER EXTREMITY VENOUS DOPPLER ULTRASOUND TECHNIQUE: Gray-scale sonography with graded compression, as well as color Doppler and duplex ultrasound were performed to evaluate the lower extremity deep venous systems from the level of the common femoral vein and including the common femoral, femoral, profunda femoral, popliteal and calf veins including the posterior tibial, peroneal and gastrocnemius veins when visible. The superficial great saphenous vein was also interrogated. Spectral Doppler was utilized to evaluate flow at rest and with distal augmentation maneuvers in the common femoral, femoral and popliteal veins. COMPARISON:  None Available. FINDINGS: RIGHT LOWER EXTREMITY Common Femoral Vein: No evidence of thrombus. Normal compressibility,  respiratory phasicity and response to augmentation. Saphenofemoral Junction: No evidence of thrombus. Normal compressibility and flow on color Doppler imaging. Profunda Femoral Vein: No evidence of thrombus. Normal compressibility and flow on color Doppler imaging. Femoral Vein: No evidence of thrombus. Normal compressibility, respiratory phasicity and response to augmentation. Popliteal Vein: No evidence of thrombus. Normal compressibility, respiratory phasicity and response to augmentation. Calf Veins: Poorly seen with overlapping soft tissue and edema. Superficial Great Saphenous Vein: No evidence of thrombus. Normal compressibility. Venous Reflux:  None. Other Findings:  None. LEFT LOWER EXTREMITY Common Femoral Vein: No evidence of thrombus. Normal compressibility, respiratory phasicity and response to augmentation. Saphenofemoral Junction: No evidence of thrombus. Normal compressibility and flow on color Doppler imaging. Profunda Femoral Vein: No evidence of thrombus. Normal compressibility and flow on color Doppler imaging. Femoral Vein: No evidence of thrombus. Normal compressibility, respiratory phasicity and response to augmentation. Popliteal Vein: No  evidence of thrombus. Normal compressibility, respiratory phasicity and response to augmentation. Calf Veins: Poorly seen with overlapping soft tissue edema. Superficial Great Saphenous Vein: No evidence of thrombus. Normal compressibility. Venous Reflux:  None. Other Findings:  None. IMPRESSION: No evidence of deep venous thrombosis in either lower extremity. Electronically Signed   By: Ranell Bring M.D.   On: 08/27/2023 16:11   DG Chest Portable 1 View Result Date: 08/27/2023 CLINICAL DATA:  Leg edema.  Assess for pulmonary edema. EXAM: PORTABLE CHEST 1 VIEW COMPARISON:  08/24/2021 FINDINGS: The heart is enlarged. Mediastinal contours are unchanged. Central vascular congestion without frank edema. No pleural fluid or pneumothorax. No focal airspace disease. On limited assessment, no acute osseous findings. IMPRESSION: Cardiomegaly with central vascular congestion. No frank edema. Electronically Signed   By: Andrea Gasman M.D.   On: 08/27/2023 14:43     Assessment/Plan 1. Venous ulcer (HCC) (Primary) Will continue Unna boot therapy for now.  I have again discussed the potential for admission into the hospital.  In talking with her it does not sound like she is able to achieve true elevation even while sleeping it sounds like she continues to have her foot in a dependent position and I believe this is why she is not responding to [employer] therapy.  I will investigate methods of getting her into the hospital.  2. Venous insufficiency See #1  3. Essential hypertension Continue antihypertensive medications as already ordered, these medications have been reviewed and there are no changes at this time.  4. Mild intermittent reactive airway disease with acute exacerbation Continue pulmonary medications and aerosols as already ordered, these medications have been reviewed and there are no changes at this time.     Cordella Shawl, MD  09/15/2023 1:01 PM

## 2023-09-17 ENCOUNTER — Other Ambulatory Visit: Payer: Self-pay | Admitting: Cardiology

## 2023-09-17 ENCOUNTER — Other Ambulatory Visit: Payer: Self-pay | Admitting: Family Medicine

## 2023-09-17 DIAGNOSIS — F324 Major depressive disorder, single episode, in partial remission: Secondary | ICD-10-CM

## 2023-09-17 DIAGNOSIS — I160 Hypertensive urgency: Secondary | ICD-10-CM

## 2023-09-17 DIAGNOSIS — I10 Essential (primary) hypertension: Secondary | ICD-10-CM

## 2023-09-24 ENCOUNTER — Encounter (INDEPENDENT_AMBULATORY_CARE_PROVIDER_SITE_OTHER): Payer: Self-pay

## 2023-09-24 ENCOUNTER — Ambulatory Visit (INDEPENDENT_AMBULATORY_CARE_PROVIDER_SITE_OTHER): Admitting: Vascular Surgery

## 2023-09-24 VITALS — BP 147/91 | HR 85 | Resp 18

## 2023-09-24 DIAGNOSIS — I83009 Varicose veins of unspecified lower extremity with ulcer of unspecified site: Secondary | ICD-10-CM | POA: Diagnosis not present

## 2023-09-24 DIAGNOSIS — L97909 Non-pressure chronic ulcer of unspecified part of unspecified lower leg with unspecified severity: Secondary | ICD-10-CM | POA: Diagnosis not present

## 2023-09-24 NOTE — Progress Notes (Signed)
 History of Present Illness  There is no documented history at this time  Assessments & Plan   There are no diagnoses linked to this encounter.    Additional instructions  Subjective:  Patient presents with venous ulcer of the Bilateral lower extremity.    Procedure:  3 layer unna wrap was placed Bilateral lower extremity.   Plan:   Follow up in one week.

## 2023-09-29 ENCOUNTER — Encounter (INDEPENDENT_AMBULATORY_CARE_PROVIDER_SITE_OTHER): Payer: Self-pay | Admitting: Vascular Surgery

## 2023-10-02 ENCOUNTER — Ambulatory Visit: Payer: Self-pay

## 2023-10-02 NOTE — Telephone Encounter (Signed)
 FYI Only or Action Required?: Action required by provider: medication refill request.  Patient was last seen in primary care on 09/06/2023 by Berneta Elsie Sayre, MD.  Called Nurse Triage reporting Pain and Medication Refill.  Triage Disposition: Call PCP Now  Patient/caregiver understands and will follow disposition?: Yes     Copied from CRM #[id number]. Topic: Clinical - Red Word Triage >> Oct 02, 2023  4:59 PM Drema MATSU wrote: Red Word that prompted transfer to Nurse Triage: Patient wants to know if Dr. Berneta can call her in Gabapentin  for 30 days to [facility]. She states that she is in extreme pain from not being on Gabapentin . [facility] have an order for it but is giving patient a hard time filling medication and wants someone to take a look into it. She is completely out of it. She doesn't want it to [facility], she wants it to go to [facility].     ----------------------------------------------------------------------- From previous Reason for Contact - Prescription Issue: Reason for CRM: Reason for Disposition  [1] Prescription not at pharmacy AND [2] was prescribed by doctor (or NP/PA) recently  (Exception: [facility] has access to EMR and prescription is recorded there. Go to Home Care and confirm prescription for pharmacy.)  Answer Assessment - Initial Assessment Questions 1. DRUG NAME: What medicine do you need to have refilled?     gabapentin  (NEURONTIN ) 400 MG capsule 2. REFILLS REMAINING: How many refills are remaining? Notes: The label on the medicine or pill bottle will show how many refills are remaining. If there are no refills remaining, then a renewal may be needed.     See chart 3. EXPIRATION DATE: What is the expiration date? Note: The label states when the prescription will expire, and thus can no longer be refilled.)     N/a 4. PRESCRIBER: Who prescribed it? Note: The prescribing doctor or group is responsible for refill  approvals.     Berneta Elsie Sayre, MD 5. PHARMACY: Have you contacted your pharmacy (drugstore)? Note: Some pharmacies will contact the doctor (or NP/PA).      Yes, pt reports Rx was initially sent to [facility]. Pt contacted [facility] and company rep reported they has the Rx, but cannot be sent out until September. Pt would like Rx transferred to [facility]. 6. SYMPTOMS: Do you have any symptoms?     Pain r/t not having Rx - [facility] advised going to UC for immediate Rx refill.  7. PREGNANCY: Is there any chance that you are pregnant? When was your last menstrual period?     N/a    Pt requesting Rx transferred to [facility]. See Triage Notes for additional info.  Protocols used: Medication Refill and Renewal Call-A-AH

## 2023-10-03 ENCOUNTER — Other Ambulatory Visit: Payer: Self-pay

## 2023-10-03 ENCOUNTER — Encounter (INDEPENDENT_AMBULATORY_CARE_PROVIDER_SITE_OTHER): Payer: Self-pay | Admitting: Nurse Practitioner

## 2023-10-03 ENCOUNTER — Other Ambulatory Visit (INDEPENDENT_AMBULATORY_CARE_PROVIDER_SITE_OTHER): Payer: Self-pay | Admitting: Nurse Practitioner

## 2023-10-03 ENCOUNTER — Ambulatory Visit (INDEPENDENT_AMBULATORY_CARE_PROVIDER_SITE_OTHER): Admitting: Nurse Practitioner

## 2023-10-03 VITALS — BP 149/93 | HR 84 | Resp 18

## 2023-10-03 DIAGNOSIS — I10 Essential (primary) hypertension: Secondary | ICD-10-CM | POA: Diagnosis not present

## 2023-10-03 DIAGNOSIS — G629 Polyneuropathy, unspecified: Secondary | ICD-10-CM

## 2023-10-03 DIAGNOSIS — L03116 Cellulitis of left lower limb: Secondary | ICD-10-CM

## 2023-10-03 MED ORDER — CIPROFLOXACIN HCL 750 MG PO TABS
750.0000 mg | ORAL_TABLET | Freq: Two times a day (BID) | ORAL | 0 refills | Status: AC
  Filled 2023-10-03: qty 20, 10d supply, fill #0

## 2023-10-03 MED ORDER — GABAPENTIN 400 MG PO CAPS
400.0000 mg | ORAL_CAPSULE | Freq: Three times a day (TID) | ORAL | 3 refills | Status: AC
Start: 2023-10-03 — End: ?

## 2023-10-03 NOTE — Progress Notes (Signed)
 Subjective:    Patient ID: Hannah Orr, female    DOB: 01/11/1976, 48 y.o.   MRN: [id number] Chief Complaint  Patient presents with   Follow-up    Possible direct admitt    The patient presents today for evaluation of a weeping wound on her right lower extremity.  She was seen in the emergency room on 08/27/2023 for this wound and was given Rocephin  as well as Keflex .  She was seen in our office again about 2 weeks later and at that time there was discussion of possible admission to the hospital.  Today she continues to have some significant drainage of the area.  The area is very shallow but it is macerated in the surrounding tissues.  The only antibiotic she had was the Keflex  other than her IV antibiotic.  She has been getting wrapped by home health and despite this has not been improving.    Review of Systems  Cardiovascular:  Positive for leg swelling.  Skin:  Positive for wound.  All other systems reviewed and are negative.      Objective:   Physical Exam Vitals reviewed.  HENT:     Head: Normocephalic.  Eyes:     General: Scleral icterus present.  Cardiovascular:     Rate and Rhythm: Normal rate.  Pulmonary:     Effort: Pulmonary effort is normal.  Skin:    General: Skin is warm and dry.  Neurological:     Mental Status: She is alert and oriented to person, place, and time.     Gait: Gait abnormal.  Psychiatric:        Mood and Affect: Mood normal.        Behavior: Behavior normal.        Thought Content: Thought content normal.        Judgment: Judgment normal.     BP (!) 149/93   Pulse 84   Resp 18   Past Medical History:  Diagnosis Date   Depression    Diabetes mellitus without complication (HCC)    Hypertension     Social History   Socioeconomic History   Marital status: Single    Spouse name: Not on file   Number of children: Not on file   Years of education: Not on file   Highest education level: Not on file  Occupational History    Not on file  Tobacco Use   Smoking status: Never   Smokeless tobacco: Never  Vaping Use   Vaping status: Never Used  Substance and Sexual Activity   Alcohol use: Not Currently    Comment: Stopped drinking December 2021.    Drug use: No   Sexual activity: Yes    Birth control/protection: Condom  Other Topics Concern   Not on file  Social History Narrative   Lives at home with mom.   Social Drivers of Corporate investment banker Strain: Not on file  Food Insecurity: No Food Insecurity (04/22/2020)   Received from [facility]   Hunger Vital Sign    Within the past 12 months, you worried that your food would run out before you got the money to buy more.: Never true    Within the past 12 months, the food you bought just didn't last and you didn't have money to get more.: Never true  Transportation Needs: Not on file  Physical Activity: Not on file  Stress: Not on file  Social Connections: Unknown (07/10/2021)   Received from [facility]   Social Network    Social Network: Not on file  Intimate Partner Violence: Unknown (06/02/2021)   Received from [facility]   HITS    Physically Hurt: Not on file    Insult or Talk Down To: Not on file    Threaten Physical Harm: Not on file    Scream or Curse: Not on file    Past Surgical History:  Procedure Laterality Date   MANDIBLE SURGERY Right 12/11/1993    Family History  Problem Relation Age of Onset   Hypertension Mother    Breast cancer Mother    Diabetes Mother    Hearing loss Mother    Hypertension Father    Prostate cancer Father    Cancer Father    Heart disease Neg Hx    Hyperlipidemia Neg Hx    Kidney disease Neg Hx    Early death Neg Hx    Stroke Neg Hx     Allergies  Allergen Reactions   Benazepril Hcl Hypertension    cough   Benazepril Hcl     cough       Latest Ref Rng & Units 08/27/2023   10:23 AM 07/17/2022    2:25 PM 11/08/2020   10:20 AM  CBC  WBC 4.0 - 10.5 K/uL 3.3  2.7  2.6   Hemoglobin  12.0 - 15.0 g/dL 89.3  87.5  87.7   Hematocrit 36.0 - 46.0 % 33.6  37.3  37.9   Platelets 150 - 400 K/uL 305  293.0  347.0       CMP     Component Value Date/Time   NA 136 08/27/2023 1409   NA 134 03/25/2021 1412   K 4.8 08/27/2023 1409   CL 106 08/27/2023 1409   CO2 24 08/27/2023 1409   GLUCOSE 92 08/27/2023 1409   BUN 23 (H) 08/27/2023 1409   BUN 9 03/25/2021 1412   CREATININE 1.10 (H) 08/27/2023 1409   CREATININE 0.75 07/28/2020 1205   CALCIUM 8.5 (L) 08/27/2023 1409   PROT 9.0 (H) 08/27/2023 1023   ALBUMIN 3.8 08/27/2023 1023   AST 24 08/27/2023 1023   AST 295 (HH) 07/28/2020 1205   ALT 12 08/27/2023 1023   ALT 120 (H) 07/28/2020 1205   ALKPHOS 48 08/27/2023 1023   BILITOT 0.6 08/27/2023 1023   BILITOT 1.0 07/28/2020 1205   GFR 90.13 05/09/2021 1152   EGFR 109 03/25/2021 1412   GFRNONAA >60 08/27/2023 1409   GFRNONAA >60 07/28/2020 1205     No results found.     Assessment & Plan:   1. Cellulitis of left lower extremity (Primary) Based upon the presentation of the wound as well as the odor I suspect that the pathogen is Pseudomonas affecting her leg.  If so, she likely has not been treated for this which is why the wound continues.  Will send in some ciprofloxacin  today but we will also culture the wound as well.  I am hopeful that we will show some improvement.  I plan on having her return in a week to reevaluate the wound.  If it has improved we will hold off on any hospital admission but if it remains a single person we will likely try to have patient direct admitted.  2. Essential hypertension Continue antihypertensive medications as already ordered, these medications have been reviewed and there are no changes at this time.   Current Outpatient Medications on File Prior to Visit  Medication Sig Dispense Refill  albuterol  (VENTOLIN  HFA) 108 (90 Base) MCG/ACT inhaler Inhale 1-2 puffs into the lungs every 6 (six) hours as needed for wheezing or shortness of  breath. Please instruct 8 g 2   buPROPion  (WELLBUTRIN  XL) 150 MG 24 hr tablet Take 1 tablet by mouth once daily 90 tablet 1   carvedilol  (COREG ) 25 MG tablet TAKE 1 TABLET BY MOUTH TWICE DAILY FOR BLOOD PRESSURE 180 tablet 2   cyanocobalamin  (VITAMIN B12) 1000 MCG tablet Take 1 tablet (1,000 mcg total) by mouth daily. 90 tablet 5   FLUoxetine  (PROZAC ) 40 MG capsule Take 1 capsule (40 mg total) by mouth daily. 90 capsule 1   levothyroxine  (SYNTHROID ) 112 MCG tablet TAKE 1 TABLET BY MOUTH ONCE DAILY BEFORE BREAKFAST 90 tablet 0   spironolactone  (ALDACTONE ) 50 MG tablet Take 1 tablet (50 mg total) by mouth daily. 90 tablet 3   Torsemide  60 MG TABS Take 60 mg by mouth daily. 90 tablet 3   Vitamin D , Ergocalciferol , (DRISDOL ) 1.25 MG (50000 UNIT) CAPS capsule Take 1 capsule (50,000 Units total) by mouth every 7 (seven) days. 15 capsule 1   No current facility-administered medications on file prior to visit.    There are no Patient Instructions on file for this visit. No follow-ups on file.   Shaquita [place] Knudtson, NP

## 2023-10-08 ENCOUNTER — Telehealth (INDEPENDENT_AMBULATORY_CARE_PROVIDER_SITE_OTHER): Payer: Self-pay

## 2023-10-08 NOTE — Telephone Encounter (Signed)
 [place], RN from [facility] called in reference to patients wound progress and stated she wasn't seeing progression with the wound healing. She was wondering if a new antibiotic would help and try a different wound care process. Please Advise.  [place], RN [employer] [phone]

## 2023-10-08 NOTE — Telephone Encounter (Signed)
 We w are awaiting culture and will adjust once we have culture results back. Changing antibiotics before have identified a pathogen will likely cause more harm

## 2023-10-09 LAB — AEROBIC CULTURE

## 2023-10-09 NOTE — Telephone Encounter (Signed)
 Spoke to [place], Charity fundraiser and made her aware we were awaiting the results of the culture for this patient and not wanting to change the antibiotic in the midst of things due to more harm than good. She was stating she didn't feel the unna boots were helping in this situation and she was was thinking a new course of action for the wound/different wound care. [place] stated she is not seeing patients today and was wondering if you could give her a call back in reference to this patient.   [place] [phone]

## 2023-10-11 ENCOUNTER — Ambulatory Visit (INDEPENDENT_AMBULATORY_CARE_PROVIDER_SITE_OTHER): Admitting: Nurse Practitioner

## 2023-10-11 ENCOUNTER — Other Ambulatory Visit (INDEPENDENT_AMBULATORY_CARE_PROVIDER_SITE_OTHER): Payer: Self-pay | Admitting: Nurse Practitioner

## 2023-10-11 MED ORDER — SULFAMETHOXAZOLE-TRIMETHOPRIM 800-160 MG PO TABS
1.0000 | ORAL_TABLET | Freq: Two times a day (BID) | ORAL | 0 refills | Status: AC
Start: 2023-10-11 — End: ?

## 2023-10-22 ENCOUNTER — Encounter (INDEPENDENT_AMBULATORY_CARE_PROVIDER_SITE_OTHER): Payer: Self-pay | Admitting: Vascular Surgery

## 2023-10-22 ENCOUNTER — Ambulatory Visit (INDEPENDENT_AMBULATORY_CARE_PROVIDER_SITE_OTHER): Admitting: Vascular Surgery

## 2023-10-22 VITALS — BP 133/76 | HR 84 | Ht 65.0 in

## 2023-10-22 DIAGNOSIS — I10 Essential (primary) hypertension: Secondary | ICD-10-CM | POA: Diagnosis not present

## 2023-10-22 DIAGNOSIS — I890 Lymphedema, not elsewhere classified: Secondary | ICD-10-CM | POA: Diagnosis not present

## 2023-10-22 DIAGNOSIS — I872 Venous insufficiency (chronic) (peripheral): Secondary | ICD-10-CM

## 2023-10-22 DIAGNOSIS — I83009 Varicose veins of unspecified lower extremity with ulcer of unspecified site: Secondary | ICD-10-CM | POA: Diagnosis not present

## 2023-10-22 DIAGNOSIS — L97909 Non-pressure chronic ulcer of unspecified part of unspecified lower leg with unspecified severity: Secondary | ICD-10-CM

## 2023-10-22 NOTE — Progress Notes (Signed)
 MRN : [id number]  Hannah Orr is a 48 y.o. (Dec 28, 1975) female who presents with chief complaint of legs swell.  History of Present Illness:   The patient presents today for evaluation of a weeping wound on her right lower extremity. She was seen in the emergency room on 08/27/2023 for this wound and was given Rocephin  as well as Keflex . She was seen in our office again about 2 weeks later and at that time there was discussion of possible admission to the hospital. Today she continues to have some significant drainage of the area. The area is very shallow but it is macerated in the surrounding tissues. The only antibiotic she had was the Keflex  other than her IV antibiotic. She has been getting wrapped by home health and despite this has not been improving.   No outpatient medications have been marked as taking for the 10/22/23 encounter (Appointment) with Jama, Cordella MATSU, MD.    Past Medical History:  Diagnosis Date   Depression    Diabetes mellitus without complication (HCC)    Hypertension     Past Surgical History:  Procedure Laterality Date   MANDIBLE SURGERY Right 12/11/1993    Social History Social History   Tobacco Use   Smoking status: Never   Smokeless tobacco: Never  Vaping Use   Vaping status: Never Used  Substance Use Topics   Alcohol use: Not Currently    Comment: Stopped drinking December 2021.    Drug use: No    Family History Family History  Problem Relation Age of Onset   Hypertension Mother    Breast cancer Mother    Diabetes Mother    Hearing loss Mother    Hypertension Father    Prostate cancer Father    Cancer Father    Heart disease Neg Hx    Hyperlipidemia Neg Hx    Kidney disease Neg Hx    Early death Neg Hx    Stroke Neg Hx     Allergies  Allergen Reactions   Benazepril Hcl Hypertension    cough   Benazepril Hcl     cough     REVIEW OF SYSTEMS (Negative unless checked)  Constitutional:  [] Weight loss  [] Fever  [] Chills Cardiac: [] Chest pain   [] Chest pressure   [] Palpitations   [] Shortness of breath when laying flat   [] Shortness of breath with exertion. Vascular:  [] Pain in legs with walking   [x] Pain in legs with standing  [] History of DVT   [] Phlebitis   [x] Swelling in legs   [] Varicose veins   [] Non-healing ulcers Pulmonary:   [] Uses home oxygen   [] Productive cough   [] Hemoptysis   [] Wheeze  [] COPD   [] Asthma Neurologic:  [] Dizziness   [] Seizures   [] History of stroke   [] History of TIA  [] Aphasia   [] Vissual changes   [] Weakness or numbness in arm   [] Weakness or numbness in leg Musculoskeletal:   [] Joint swelling   [] Joint pain   [] Low back pain Hematologic:  [] Easy bruising  [] Easy bleeding   [] Hypercoagulable state   [] Anemic Gastrointestinal:  [] Diarrhea   [] Vomiting  [] Gastroesophageal reflux/heartburn   [] Difficulty swallowing. Genitourinary:  [] Chronic kidney disease   [] Difficult urination  [] Frequent urination   [] Blood in urine Skin:  [] Rashes   [] Ulcers  Psychological:  [] History of anxiety   []   History of major depression.  Physical Examination  There were no vitals filed for this visit. There is no height or weight on file to calculate BMI. Gen: WD/WN, NAD Head: [place]/AT, No temporalis wasting.  Ear/Nose/Throat: Hearing grossly intact, nares w/o erythema or drainage, pinna without lesions Eyes: PER, EOMI, sclera nonicteric.  Neck: Supple, no gross masses.  No JVD.  Pulmonary:  Good air movement, no audible wheezing, no use of accessory muscles.  Cardiac: RRR, precordium not hyperdynamic. Vascular:  scattered varicosities present bilaterally.  Mild venous stasis changes to the legs bilaterally.  3-4+ soft pitting edema, CEAP C4sEpAsPr large superficial ulcerations of the right calf Vessel Right Left  Radial Palpable Palpable  Gastrointestinal: soft, non-distended. No guarding/no peritoneal signs.  Musculoskeletal: M/S 5/5 throughout.  No deformity.   Neurologic: CN 2-12 intact. Pain and light touch intact in extremities.  Symmetrical.  Speech is fluent. Motor exam as listed above. Psychiatric: Judgment intact, Mood & affect appropriate for pt's clinical situation. Dermatologic: Venous rashes + ulcers noted.  No changes consistent with cellulitis. Lymph : No lichenification or skin changes of chronic lymphedema.  CBC Lab Results  Component Value Date   WBC 3.3 (L) 08/27/2023   HGB 10.6 (L) 08/27/2023   HCT 33.6 (L) 08/27/2023   MCV 113.1 (H) 08/27/2023   PLT 305 08/27/2023    BMET    Component Value Date/Time   NA 136 08/27/2023 1409   NA 134 03/25/2021 1412   K 4.8 08/27/2023 1409   CL 106 08/27/2023 1409   CO2 24 08/27/2023 1409   GLUCOSE 92 08/27/2023 1409   BUN 23 (H) 08/27/2023 1409   BUN 9 03/25/2021 1412   CREATININE 1.10 (H) 08/27/2023 1409   CREATININE 0.75 07/28/2020 1205   CALCIUM 8.5 (L) 08/27/2023 1409   GFRNONAA >60 08/27/2023 1409   GFRNONAA >60 07/28/2020 1205   CrCl cannot be calculated (Patient's most recent lab result is older than the maximum 21 days allowed.).  COAG No results found for: INR, PROTIME  Radiology No results found.   Assessment/Plan 1. Venous ulcer (HCC) (Primary) Will continue Unna boot therapy for now. I have again discussed the potential for admission into the hospital. In talking with her it does not sound like she is able to achieve true elevation even while sleeping it sounds like she continues to have her foot in a dependent position and I believe this is why she is not responding to [employer] therapy. I will try to arrange direct admission to the hospital.  Although the last time I did this she was not prepared to come into the hospital she is now voicing more support for this plan.  2. Venous insufficiency Will continue Unna boot therapy for now. I have again discussed the potential for admission into the hospital. In talking with her it does not sound like she is able  to achieve true elevation even while sleeping it sounds like she continues to have her foot in a dependent position and I believe this is why she is not responding to [employer] therapy. I will try to arrange direct admission to the hospital.  Although the last time I did this she was not prepared to come into the hospital she is now voicing more support for this plan.  3. Lymphedema Will continue Unna boot therapy for now. I have again discussed the potential for admission into the hospital. In talking with her it does not sound like she is able to achieve true  elevation even while sleeping it sounds like she continues to have her foot in a dependent position and I believe this is why she is not responding to [employer] therapy. I will try to arrange direct admission to the hospital.  Although the last time I did this she was not prepared to come into the hospital she is now voicing more support for this plan.  4. Essential hypertension Continue antihypertensive medications as already ordered, these medications have been reviewed and there are no changes at this time.   Cordella Shawl, MD  10/22/2023 11:20 AM

## 2023-10-23 ENCOUNTER — Telehealth (INDEPENDENT_AMBULATORY_CARE_PROVIDER_SITE_OTHER): Payer: Self-pay

## 2023-10-23 ENCOUNTER — Inpatient Hospital Stay
Admission: RE | Admit: 2023-10-23 | Discharge: 2023-10-30 | DRG: 300 | Disposition: A | Discharge status: Home / Self Care | Source: Ambulatory Visit | Attending: Vascular Surgery | Admitting: Vascular Surgery

## 2023-10-23 ENCOUNTER — Encounter (HOSPITAL_COMMUNITY): Payer: Self-pay

## 2023-10-23 DIAGNOSIS — I83009 Varicose veins of unspecified lower extremity with ulcer of unspecified site: Secondary | ICD-10-CM | POA: Diagnosis not present

## 2023-10-23 DIAGNOSIS — Z888 Allergy status to other drugs, medicaments and biological substances status: Secondary | ICD-10-CM

## 2023-10-23 DIAGNOSIS — K219 Gastro-esophageal reflux disease without esophagitis: Secondary | ICD-10-CM | POA: Diagnosis present

## 2023-10-23 DIAGNOSIS — E039 Hypothyroidism, unspecified: Secondary | ICD-10-CM | POA: Diagnosis present

## 2023-10-23 DIAGNOSIS — Z6841 Body Mass Index (BMI) 40.0 and over, adult: Secondary | ICD-10-CM

## 2023-10-23 DIAGNOSIS — G4733 Obstructive sleep apnea (adult) (pediatric): Secondary | ICD-10-CM | POA: Diagnosis present

## 2023-10-23 DIAGNOSIS — L97909 Non-pressure chronic ulcer of unspecified part of unspecified lower leg with unspecified severity: Secondary | ICD-10-CM | POA: Diagnosis not present

## 2023-10-23 DIAGNOSIS — F32A Depression, unspecified: Secondary | ICD-10-CM | POA: Diagnosis present

## 2023-10-23 DIAGNOSIS — Z8249 Family history of ischemic heart disease and other diseases of the circulatory system: Secondary | ICD-10-CM | POA: Diagnosis not present

## 2023-10-23 DIAGNOSIS — Z803 Family history of malignant neoplasm of breast: Secondary | ICD-10-CM | POA: Diagnosis not present

## 2023-10-23 DIAGNOSIS — Z833 Family history of diabetes mellitus: Secondary | ICD-10-CM

## 2023-10-23 DIAGNOSIS — Z881 Allergy status to other antibiotic agents status: Secondary | ICD-10-CM

## 2023-10-23 DIAGNOSIS — Z79899 Other long term (current) drug therapy: Secondary | ICD-10-CM | POA: Diagnosis not present

## 2023-10-23 DIAGNOSIS — L03115 Cellulitis of right lower limb: Secondary | ICD-10-CM | POA: Diagnosis present

## 2023-10-23 DIAGNOSIS — E8770 Fluid overload, unspecified: Secondary | ICD-10-CM | POA: Diagnosis present

## 2023-10-23 DIAGNOSIS — E119 Type 2 diabetes mellitus without complications: Secondary | ICD-10-CM | POA: Diagnosis present

## 2023-10-23 DIAGNOSIS — I5031 Acute diastolic (congestive) heart failure: Secondary | ICD-10-CM | POA: Diagnosis not present

## 2023-10-23 DIAGNOSIS — I10 Essential (primary) hypertension: Secondary | ICD-10-CM | POA: Diagnosis present

## 2023-10-23 DIAGNOSIS — Z8042 Family history of malignant neoplasm of prostate: Secondary | ICD-10-CM | POA: Diagnosis not present

## 2023-10-23 DIAGNOSIS — I890 Lymphedema, not elsewhere classified: Secondary | ICD-10-CM | POA: Diagnosis present

## 2023-10-23 DIAGNOSIS — Z822 Family history of deafness and hearing loss: Secondary | ICD-10-CM

## 2023-10-23 DIAGNOSIS — I872 Venous insufficiency (chronic) (peripheral): Secondary | ICD-10-CM | POA: Diagnosis present

## 2023-10-23 DIAGNOSIS — Z79890 Hormone replacement therapy (postmenopausal): Secondary | ICD-10-CM | POA: Diagnosis not present

## 2023-10-23 DIAGNOSIS — L97219 Non-pressure chronic ulcer of right calf with unspecified severity: Secondary | ICD-10-CM | POA: Diagnosis present

## 2023-10-23 DIAGNOSIS — L03119 Cellulitis of unspecified part of limb: Principal | ICD-10-CM | POA: Diagnosis present

## 2023-10-23 LAB — CBC
HCT: 33.3 % — ABNORMAL LOW (ref 36.0–46.0)
Hemoglobin: 11.1 g/dL — ABNORMAL LOW (ref 12.0–15.0)
MCH: 36.4 pg — ABNORMAL HIGH (ref 26.0–34.0)
MCHC: 33.3 g/dL (ref 30.0–36.0)
MCV: 109.2 fL — ABNORMAL HIGH (ref 80.0–100.0)
Platelets: 283 K/uL (ref 150–400)
RBC: 3.05 MIL/uL — ABNORMAL LOW (ref 3.87–5.11)
RDW: 12.6 % (ref 11.5–15.5)
WBC: 3.4 K/uL — ABNORMAL LOW (ref 4.0–10.5)
nRBC: 0 % (ref 0.0–0.2)

## 2023-10-23 LAB — COMPREHENSIVE METABOLIC PANEL WITH GFR
ALT: 13 U/L (ref 0–44)
AST: 26 U/L (ref 15–41)
Albumin: 4 g/dL (ref 3.5–5.0)
Alkaline Phosphatase: 52 U/L (ref 38–126)
Anion gap: 9 (ref 5–15)
BUN: 15 mg/dL (ref 6–20)
CO2: 20 mmol/L — ABNORMAL LOW (ref 22–32)
Calcium: 9 mg/dL (ref 8.9–10.3)
Chloride: 104 mmol/L (ref 98–111)
Creatinine, Ser: 1.1 mg/dL — ABNORMAL HIGH (ref 0.44–1.00)
GFR, Estimated: >60 mL/min (ref >60)
Glucose, Bld: 99 mg/dL (ref 70–99)
Potassium: 4.6 mmol/L (ref 3.5–5.1)
Sodium: 133 mmol/L — ABNORMAL LOW (ref 135–145)
Total Bilirubin: 0.6 mg/dL (ref 0.0–1.2)
Total Protein: 9.6 g/dL — ABNORMAL HIGH (ref 6.5–8.1)

## 2023-10-23 LAB — MAGNESIUM: Magnesium: 2 mg/dL (ref 1.7–2.4)

## 2023-10-23 LAB — BRAIN NATRIURETIC PEPTIDE: B Natriuretic Peptide: 10.2 pg/mL (ref 0.0–100.0)

## 2023-10-23 MED ORDER — DOCUSATE SODIUM 100 MG PO CAPS
100.0000 mg | ORAL_CAPSULE | Freq: Two times a day (BID) | ORAL | Status: DC
  Administered 2023-10-23 – 2023-10-30 (×13): 100 mg via ORAL
  Filled 2023-10-23 (×13): qty 1

## 2023-10-23 MED ORDER — MAGNESIUM CITRATE PO SOLN: 1.0000 | Freq: Once | ORAL | Status: DC | PRN

## 2023-10-23 MED ORDER — ACETAMINOPHEN 325 MG PO TABS: 650.0000 mg | ORAL_TABLET | Freq: Four times a day (QID) | ORAL | Status: DC | PRN

## 2023-10-23 MED ORDER — ACETAMINOPHEN 650 MG RE SUPP: 650.0000 mg | Freq: Four times a day (QID) | RECTAL | Status: DC | PRN

## 2023-10-23 MED ORDER — OXYCODONE HCL 5 MG PO TABS
5.0000 mg | ORAL_TABLET | ORAL | Status: DC | PRN
  Administered 2023-10-23 – 2023-10-30 (×19): 5 mg via ORAL
  Filled 2023-10-23 (×20): qty 1

## 2023-10-23 MED ORDER — ZOLPIDEM TARTRATE 5 MG PO TABS
5.0000 mg | ORAL_TABLET | Freq: Every evening | ORAL | Status: DC | PRN
  Administered 2023-10-23 – 2023-10-29 (×6): 5 mg via ORAL
  Filled 2023-10-23 (×6): qty 1

## 2023-10-23 MED ORDER — BUPROPION HCL ER (XL) 150 MG PO TB24
150.0000 mg | ORAL_TABLET | Freq: Every day | ORAL | Status: DC
  Administered 2023-10-23 – 2023-10-30 (×8): 150 mg via ORAL
  Filled 2023-10-23 (×8): qty 1

## 2023-10-23 MED ORDER — MAGNESIUM HYDROXIDE 400 MG/5ML PO SUSP
30.0000 mL | Freq: Every day | ORAL | Status: DC | PRN
  Administered 2023-10-27: 30 mL via ORAL
  Filled 2023-10-23: qty 30

## 2023-10-23 MED ORDER — LEVOTHYROXINE SODIUM 112 MCG PO TABS
112.0000 ug | ORAL_TABLET | Freq: Every day | ORAL | Status: DC
  Administered 2023-10-24 – 2023-10-30 (×7): 112 ug via ORAL
  Filled 2023-10-23 (×7): qty 1

## 2023-10-23 MED ORDER — FLUOXETINE HCL 20 MG PO CAPS
40.0000 mg | ORAL_CAPSULE | Freq: Every day | ORAL | Status: DC
  Administered 2023-10-24 – 2023-10-30 (×7): 40 mg via ORAL
  Filled 2023-10-23 (×7): qty 2

## 2023-10-23 MED ORDER — ALBUTEROL SULFATE (2.5 MG/3ML) 0.083% IN NEBU: 2.5000 mg | INHALATION_SOLUTION | Freq: Four times a day (QID) | RESPIRATORY_TRACT | Status: DC | PRN

## 2023-10-23 MED ORDER — METHOCARBAMOL 1000 MG/10ML IJ SOLN: 500.0000 mg | Freq: Four times a day (QID) | INTRAMUSCULAR | Status: DC | PRN

## 2023-10-23 MED ORDER — ONDANSETRON HCL 4 MG PO TABS: 4.0000 mg | ORAL_TABLET | Freq: Four times a day (QID) | ORAL | Status: DC | PRN

## 2023-10-23 MED ORDER — CARVEDILOL 25 MG PO TABS
25.0000 mg | ORAL_TABLET | Freq: Two times a day (BID) | ORAL | Status: DC
  Administered 2023-10-24 – 2023-10-30 (×13): 25 mg via ORAL
  Filled 2023-10-23 (×13): qty 1

## 2023-10-23 MED ORDER — ONDANSETRON HCL 4 MG/2ML IJ SOLN
4.0000 mg | Freq: Four times a day (QID) | INTRAMUSCULAR | Status: DC | PRN
  Administered 2023-10-24: 4 mg via INTRAVENOUS
  Filled 2023-10-23: qty 2

## 2023-10-23 MED ORDER — SODIUM CHLORIDE 0.9% FLUSH: 3.0000 mL | INTRAVENOUS | Status: DC | PRN

## 2023-10-23 MED ORDER — MORPHINE SULFATE (PF) 2 MG/ML IV SOLN: 2.0000 mg | INTRAVENOUS | Status: DC | PRN

## 2023-10-23 MED ORDER — SORBITOL 70 % SOLN
30.0000 mL | Freq: Every day | Status: DC | PRN
Start: 2023-10-23 — End: 2023-10-30

## 2023-10-23 MED ORDER — GABAPENTIN 400 MG PO CAPS
400.0000 mg | ORAL_CAPSULE | Freq: Three times a day (TID) | ORAL | Status: DC
  Administered 2023-10-23 – 2023-10-30 (×20): 400 mg via ORAL
  Filled 2023-10-23 (×20): qty 1

## 2023-10-23 MED ORDER — SODIUM CHLORIDE 0.9 % IV SOLN: 250.0000 mL | INTRAVENOUS | Status: AC | PRN

## 2023-10-23 MED ORDER — SODIUM CHLORIDE 0.9% FLUSH
3.0000 mL | Freq: Two times a day (BID) | INTRAVENOUS | Status: DC
  Administered 2023-10-23 – 2023-10-30 (×11): 3 mL via INTRAVENOUS

## 2023-10-23 MED ORDER — ENOXAPARIN SODIUM 80 MG/0.8ML IJ SOSY
80.0000 mg | PREFILLED_SYRINGE | INTRAMUSCULAR | Status: DC
  Administered 2023-10-23 – 2023-10-29 (×7): 80 mg via SUBCUTANEOUS
  Filled 2023-10-23 (×7): qty 0.8

## 2023-10-23 MED ORDER — SODIUM CHLORIDE 0.9% FLUSH
3.0000 mL | Freq: Two times a day (BID) | INTRAVENOUS | Status: DC
  Administered 2023-10-23 – 2023-10-30 (×14): 3 mL via INTRAVENOUS

## 2023-10-23 NOTE — Telephone Encounter (Signed)
 PT has called [facility] since being contacted by [facility] in reference to direct admit.   First Call- PT called and spoke with Tanya. PT states that she has forgotten the instructions given to her. PT advised to callback and ask for instructions. PT also was advised that if she does not show up for the direct admit today, she would need to proceed to the ER if she had issues after hours.   Five minutes later: Second Call- PT called and spoke w/Laura Dreama, stating she doesn't have instructions and not sure what to do. Leita came to get me for instructions, I took over the call. PT asked to put me on hold, PT did not come back to the phone for approximately 10 minutes, call disconnected by me.   Third Call: PT states that she does not have a ride to the hospital. PT is waiting on her mother to get off work. PT states that she is very worried about being admitted. The PT and I discuss the reasoning for the admission, what to expect, and reassured that provider(s) from our office would be rounding with her during her admission.  PT wants to know what to pack for her stay, if she needs to bring her medications, if she needs to eat before coming, what to wear while she is admitted (clothing). PT was advised to bring the clothes that she wants to be discharged in. PT asked about cross word puzzles, tablets, medication. PT was advised to bring those items if it made her comfortable. PT was also advised that the hospital may not use her medication brought from home, as they may enter orders and provide those meds from our pharmacy. PT was also advised to call back and ask if she needs to bring her medication along with verifying information for instructions on arrival. PT asked when she would be discharged, I advised that I am not able to provide that information, as it will be discussed once she has checked in and a care plan developed. PT was also advised that it may determine on how well she is healing with other  factors. PT was reassured that Dr. Jama & other providers from [facility] would be able to see her admission and be available if any needs arise. PT and I discussed the potential for non-compliance issues if she choose to not follow through with the admissions. PT understands that it is very important to follow the medical plan/advise given by her care team in order to see improvements with her legs/health. PT states that she will follow through with the admissions, and hopes that this will help to heal her legs.

## 2023-10-23 NOTE — Progress Notes (Addendum)
 PHARMACIST - PHYSICIAN COMMUNICATION  CONCERNING:  Enoxaparin  (Lovenox ) for DVT Prophylaxis    RECOMMENDATION: Patient was prescribed enoxaprin 40mg  q24 hours for VTE prophylaxis.   Filed Weights   10/23/23 1959  Weight: (!) 166.5 kg (367 lb 1.6 oz)    Body mass index is 61.09 kg/m.  Estimated Creatinine Clearance: 99.5 mL/min (A) (by C-G formula based on SCr of 1.1 mg/dL (H)).   Based on [facility] policy patient is candidate for enoxaparin  0.5mg /kg TBW SQ every 24 hours based on BMI being >30.  DESCRIPTION: Pharmacy has adjusted enoxaparin  dose per [facility] policy.  Patient is now receiving enoxaparin  80 mg every 24 hours    Hannah Orr, PharmD Clinical Pharmacist  10/23/2023 9:58 PM

## 2023-10-23 NOTE — Telephone Encounter (Addendum)
 PT called at 1:30 pm; asking when the hospital would be calling for her direct admit instructions.PT advised that she was told to go home and wait for phone call from the hospital.    PT states that she was in the office yesterday and told the CMA's they were not wrapping my legs right and gave them very detailed instructions, step by step on how to approprietly wrap her legs. Patient is also expressing frustrations about home health no showing for their 1:30 pm today. I advise the notes I am seeing for home health is for an appointment on Thursday, not today. PT then stated she talked to them today and they will be out in 30 minutes. I asked the PT if she is wanting to follow up with home health about the appointment today and/or would she like for me to f/u on the direct admit. PT stated that she does not understand what the hospital can do for her, they don't even have all the stuff needed to care for my legs. PT stated that she will do whatever Dr. Jama advises.   Caller placed on hold.   F/U w/Fallon Brown-NP about this PT. Per review of the notes and confirmation w/Dr. Jama, PT needs to be direct admit, if she is willing to do so. PT has declined direct admit in the past.  Orvin Daring, NP called and setup direct admit.   Call resumed. PT was notified (by me) that she would get a phone call today about the direct admit process and she would need to follow those instructions. PT stated that home health had just shown up, I advised that she needs to inform home health that she would be admitted to hospital today since they are providing care for the same medical reason.   Call was disconnected.

## 2023-10-24 ENCOUNTER — Encounter: Payer: Self-pay | Admitting: Vascular Surgery

## 2023-10-24 ENCOUNTER — Other Ambulatory Visit: Payer: Self-pay

## 2023-10-24 DIAGNOSIS — L97909 Non-pressure chronic ulcer of unspecified part of unspecified lower leg with unspecified severity: Secondary | ICD-10-CM | POA: Diagnosis not present

## 2023-10-24 DIAGNOSIS — I83009 Varicose veins of unspecified lower extremity with ulcer of unspecified site: Secondary | ICD-10-CM | POA: Diagnosis not present

## 2023-10-24 DIAGNOSIS — L03115 Cellulitis of right lower limb: Secondary | ICD-10-CM

## 2023-10-24 LAB — COMPREHENSIVE METABOLIC PANEL WITH GFR
ALT: 12 U/L (ref 0–44)
AST: 22 U/L (ref 15–41)
Albumin: 3.5 g/dL (ref 3.5–5.0)
Alkaline Phosphatase: 48 U/L (ref 38–126)
Anion gap: 8 (ref 5–15)
BUN: 16 mg/dL (ref 6–20)
CO2: 26 mmol/L (ref 22–32)
Calcium: 8.9 mg/dL (ref 8.9–10.3)
Chloride: 102 mmol/L (ref 98–111)
Creatinine, Ser: 1.06 mg/dL — ABNORMAL HIGH (ref 0.44–1.00)
GFR, Estimated: >60 mL/min (ref >60)
Glucose, Bld: 121 mg/dL — ABNORMAL HIGH (ref 70–99)
Potassium: 4.7 mmol/L (ref 3.5–5.1)
Sodium: 136 mmol/L (ref 135–145)
Total Bilirubin: 0.8 mg/dL (ref 0.0–1.2)
Total Protein: 8.3 g/dL — ABNORMAL HIGH (ref 6.5–8.1)

## 2023-10-24 LAB — BASIC METABOLIC PANEL WITH GFR
Anion gap: 9 (ref 5–15)
BUN: 15 mg/dL (ref 6–20)
CO2: 24 mmol/L (ref 22–32)
Calcium: 8.8 mg/dL — ABNORMAL LOW (ref 8.9–10.3)
Chloride: 102 mmol/L (ref 98–111)
Creatinine, Ser: 1.09 mg/dL — ABNORMAL HIGH (ref 0.44–1.00)
GFR, Estimated: >60 mL/min (ref >60)
Glucose, Bld: 116 mg/dL — ABNORMAL HIGH (ref 70–99)
Potassium: 5.1 mmol/L (ref 3.5–5.1)
Sodium: 135 mmol/L (ref 135–145)

## 2023-10-24 MED ORDER — FUROSEMIDE 10 MG/ML IJ SOLN
40.0000 mg | Freq: Two times a day (BID) | INTRAMUSCULAR | Status: DC
  Administered 2023-10-24 – 2023-10-25 (×3): 40 mg via INTRAVENOUS
  Filled 2023-10-24 (×4): qty 4

## 2023-10-24 MED ORDER — SPIRONOLACTONE 25 MG PO TABS
25.0000 mg | ORAL_TABLET | Freq: Every day | ORAL | Status: DC
  Administered 2023-10-24 – 2023-10-30 (×7): 25 mg via ORAL
  Filled 2023-10-24 (×7): qty 1

## 2023-10-24 MED ORDER — ALUM & MAG HYDROXIDE-SIMETH 200-200-20 MG/5ML PO SUSP
15.0000 mL | ORAL | Status: DC | PRN
  Administered 2023-10-24: 15 mL via ORAL
  Filled 2023-10-24: qty 30

## 2023-10-24 MED ORDER — HYDROCHLOROTHIAZIDE 25 MG PO TABS
25.0000 mg | ORAL_TABLET | Freq: Every day | ORAL | Status: DC
  Administered 2023-10-24: 25 mg via ORAL
  Filled 2023-10-24 (×2): qty 1

## 2023-10-24 MED ORDER — SPIRONOLACTONE-HCTZ 25-25 MG PO TABS: 1.0000 | ORAL_TABLET | Freq: Every day | ORAL | Status: DC

## 2023-10-24 NOTE — H&P (Signed)
 MRN : [id number]  Hannah Orr is a 48 y.o. (1975/10/10) female who presents with chief complaint of legs hurt and swell.  History of Present Illness:   Patient presents to [facility] for treatment of her uncontrolled lymphedema and cellulitis of the right lower extremity.  She was last seen in the office October 22, 2023 for lymphedema that was out of control associated with ulceration and cellulitis of the right lower extremity.  She has been failing to improve.  She notes theweeping wound on her right lower extremity has been steadily worsening to the point that she is soaking the carpet at her home because of the uncontrolled drainage. She was seen in the emergency room on 08/27/2023 for this wound and was given Rocephin  as well as Keflex . She was seen in our office again about 2 weeks later and at that time there was discussion of possible admission to the hospital. Today she continues to have some significant drainage of the area. The area is very shallow but it is macerated in the surrounding tissues. The only antibiotic she had was the Keflex  other than her IV antibiotic. She has been getting wrapped by home health and despite this has not been improving.   No outpatient medications have been marked as taking for the 10/23/23 encounter [facility] Encounter).    Past Medical History:  Diagnosis Date   Depression    Diabetes mellitus without complication (HCC)    Hypertension     Past Surgical History:  Procedure Laterality Date   MANDIBLE SURGERY Right 12/11/1993    Social History Social History   Tobacco Use   Smoking status: Never   Smokeless tobacco: Never  Vaping Use   Vaping status: Never Used  Substance Use Topics   Alcohol use: Not Currently    Comment: Stopped drinking December 2021.    Drug use: No    Family History Family History  Problem Relation Age of Onset   Hypertension Mother    Breast cancer Mother    Diabetes  Mother    Hearing loss Mother    Hypertension Father    Prostate cancer Father    Cancer Father    Heart disease Neg Hx    Hyperlipidemia Neg Hx    Kidney disease Neg Hx    Early death Neg Hx    Stroke Neg Hx     Allergies  Allergen Reactions   Benazepril Hcl Hypertension    cough   Benazepril Hcl     cough     REVIEW OF SYSTEMS (Negative unless checked)  Constitutional: [] Weight loss  [] Fever  [] Chills Cardiac: [] Chest pain   [] Chest pressure   [] Palpitations   [] Shortness of breath when laying flat   [] Shortness of breath with exertion. Vascular:  [] Pain in legs with walking   [x] Pain in legs at rest  [] History of DVT   [] Phlebitis   [x] Swelling in legs   [] Varicose veins   [] Non-healing ulcers Pulmonary:   [] Uses home oxygen   [] Productive cough   [] Hemoptysis   [] Wheeze  [] COPD   [] Asthma Neurologic:  [] Dizziness   [] Seizures   [] History of stroke   [] History of TIA  [] Aphasia   [] Vissual changes   [] Weakness or numbness in arm   [] Weakness or numbness in leg Musculoskeletal:   [] Joint swelling   [] Joint pain   [] Low back pain Hematologic:  [] Easy bruising  [] Easy bleeding   [] Hypercoagulable state   []   Anemic Gastrointestinal:  [] Diarrhea   [] Vomiting  [] Gastroesophageal reflux/heartburn   [] Difficulty swallowing. Genitourinary:  [] Chronic kidney disease   [] Difficult urination  [] Frequent urination   [] Blood in urine Skin:  [] Rashes   [] Ulcers  Psychological:  [] History of anxiety   []  History of major depression.  Physical Examination  Vitals:   10/23/23 1959 10/23/23 2153 10/24/23 0442 10/24/23 0755  BP: (!) 149/67  (!) 157/82 135/68  Pulse: 86  89 86  Resp: 19  16 16   Temp: 97.9 F (36.6 C)  98.4 F (36.9 C) 98.1 F (36.7 C)  TempSrc: Oral   Oral  SpO2: 96%  98% 97%  Weight: (!) 166.5 kg     Height:  5' 5 (1.651 m)     Body mass index is 61.09 kg/m. Gen: WD/WN, NAD Head: [place]/AT, No temporalis wasting.  Ear/Nose/Throat: Hearing grossly intact, nares w/o  erythema or drainage, pinna without lesions Eyes: PER, EOMI, sclera nonicteric.  Neck: Supple, no gross masses.  No JVD.  Pulmonary:  Good air movement, no audible wheezing, no use of accessory muscles.  Cardiac: RRR, precordium not hyperdynamic. Vascular:  scattered varicosities present bilaterally.  Profound venous stasis changes to the legs bilaterally.  4+ non-pitting edema. CEAP C4sEpAsPr she has an ulceration which remains superficial in the posterior aspect of her right calf Vessel Right Left  Radial Palpable Palpable  Gastrointestinal: soft, non-distended. No guarding/no peritoneal signs.  Musculoskeletal: M/S 5/5 throughout.  No deformity.  Neurologic: CN 2-12 intact. Pain and light touch intact in extremities.  Symmetrical.  Speech is fluent. Motor exam as listed above. Psychiatric: Judgment intact, Mood & affect appropriate for pt's clinical situation. Dermatologic: Severe venous rashes positive ulcers noted.  No changes consistent with cellulitis. Lymph : No lichenification or skin changes of chronic lymphedema.  CBC Lab Results  Component Value Date   WBC 3.4 (L) 10/23/2023   HGB 11.1 (L) 10/23/2023   HCT 33.3 (L) 10/23/2023   MCV 109.2 (H) 10/23/2023   PLT 283 10/23/2023    BMET    Component Value Date/Time   NA 135 10/24/2023 0432   NA 134 03/25/2021 1412   K 5.1 10/24/2023 0432   CL 102 10/24/2023 0432   CO2 24 10/24/2023 0432   GLUCOSE 116 (H) 10/24/2023 0432   BUN 15 10/24/2023 0432   BUN 9 03/25/2021 1412   CREATININE 1.09 (H) 10/24/2023 0432   CREATININE 0.75 07/28/2020 1205   CALCIUM 8.8 (L) 10/24/2023 0432   GFRNONAA >60 10/24/2023 0432   GFRNONAA >60 07/28/2020 1205   Estimated Creatinine Clearance: 100.4 mL/min (A) (by C-G formula based on SCr of 1.09 mg/dL (H)).  COAG No results found for: INR, PROTIME  Radiology No results found.   Assessment/Plan 1. Venous ulcer (HCC) (Primary) Will continue Unna boot therapy for now. I have again  discussed the potential for admission into the hospital. In talking with her it does not sound like she is able to achieve true elevation even while sleeping it sounds like she continues to have her foot in a dependent position and I believe this is why she is not responding to [employer] therapy. I will try to arrange direct admission to the hospital.  Although the last time I did this she was not prepared to come into the hospital she is now voicing more support for this plan.  I will ask the hospitalist service to consult to see if diuresis would be helpful and to help evaluate for possible right  sided cardiac dysfunction.   2. Venous insufficiency Will continue Unna boot therapy for now. I have again discussed the potential for admission into the hospital. In talking with her it does not sound like she is able to achieve true elevation even while sleeping it sounds like she continues to have her foot in a dependent position and I believe this is why she is not responding to [employer] therapy. I will try to arrange direct admission to the hospital.  Although the last time I did this she was not prepared to come into the hospital she is now voicing more support for this plan.   3. Lymphedema Will continue Unna boot therapy for now. I have again discussed the potential for admission into the hospital. In talking with her it does not sound like she is able to achieve true elevation even while sleeping it sounds like she continues to have her foot in a dependent position and I believe this is why she is not responding to [employer] therapy. I will try to arrange direct admission to the hospital.  Although the last time I did this she was not prepared to come into the hospital she is now voicing more support for this plan.   4. Essential hypertension Continue antihypertensive medications as already ordered, these medications have been reviewed and there are no changes at this time.   Cordella Shawl,  MD  10/24/2023 9:33 AM

## 2023-10-24 NOTE — Plan of Care (Signed)
  Problem: Education: Goal: Knowledge of General Education information will improve Description: Including pain rating scale, medication(s)/side effects and non-pharmacologic comfort measures Outcome: Progressing   Problem: Pain Managment: Goal: General experience of comfort will improve and/or be controlled Outcome: Progressing

## 2023-10-24 NOTE — TOC CM/SW Note (Signed)
 Transition of Care [facility]) - Inpatient Brief Assessment   Patient Details  Name: Hannah Orr MRN: [id number] Date of Birth: 07/11/1975  Transition of Care [facility]) CM/SW Contact:    Corean ONEIDA Haddock, RN Phone Number: 10/24/2023, 12:32 PM   Clinical Narrative:   Transition of Care [facility]) Screening Note   Patient Details  Name: Hannah Orr Date of Birth: May 16, 1975   Transition of Care [facility]) CM/SW Contact:    Corean ONEIDA Haddock, RN Phone Number: 10/24/2023, 12:32 PM    Transition of Care [facility]) has reviewed patient and no TOC needs have been identified at this time. . If new patient transition needs arise, please place a TOC consult.  Patient currently active with home health RN through [facility]     Transition of Care Asessment: Insurance and Status: Insurance coverage has been reviewed Patient has primary care physician: Yes     Prior/Current Home Services: Current home services   Readmission risk has been reviewed: No Transition of care needs: no transition of care needs at this time

## 2023-10-24 NOTE — Plan of Care (Signed)
   Problem: Education: Goal: Knowledge of General Education information will improve Description Including pain rating scale, medication(s)/side effects and non-pharmacologic comfort measures Outcome: Progressing   Problem: Health Behavior/Discharge Planning: Goal: Ability to manage health-related needs will improve Outcome: Progressing

## 2023-10-24 NOTE — Care Management Important Message (Signed)
 Important Message  Patient Details  Name: Hannah Orr MRN: [id number] Date of Birth: 09/21/1975   Important Message Given:  Yes - Medicare IM     Rojelio [name] 10/24/2023, 12:48 PM

## 2023-10-24 NOTE — Consult Note (Signed)
 History and Physical    Hannah Orr FMW:[id number] DOB: [birth year] DOA: 10/23/2023  DOS: the patient was seen and examined on 10/23/2023  PCP: Berneta Elsie Sayre, MD   Patient coming from: Home  I have personally briefly reviewed patient's old medical records in [facility]  Chief Complaint: Right leg swelling and weeping  HPI: Hannah Orr is a pleasant 48 y.o. female with medical history significant for depression, HTN, morbid obesity, bilateral lower extremity lymphedema who was admitted to vascular service for bilateral leg pain and swelling.  She was treated for right lower extremity cellulitis and ulceration with oral antibiotic.  She did not get better and was admitted to vascular service for right lower extremity venous ulceration and venous insufficiency.  Hospitalist service was consulted for concern for uncontrolled swelling and ruling out right heart failure. Patient was seen and examined at bedside.  Patient is stated that she has been having weeping of the bilateral lower extremity and that is very concerning.  She has been wrapped up to both lower extremities but the weeping has not improved.  She denies any fever or chills.  She denies any nausea vomiting abdomen pain chest pain shortness of palpitations.  Review of Systems:  ROS  All other systems negative except as noted in the HPI.  Past Medical History:  Diagnosis Date   Depression    Diabetes mellitus without complication (HCC)    Hypertension     Past Surgical History:  Procedure Laterality Date   MANDIBLE SURGERY Right 12/11/1993     reports that she has never smoked. She has never used smokeless tobacco. She reports that she does not currently use alcohol. She reports that she does not use drugs.  Allergies  Allergen Reactions   Benazepril Hcl Hypertension    cough   Benazepril Hcl     cough    Family History  Problem Relation Age of Onset   Hypertension Mother    Breast cancer  Mother    Diabetes Mother    Hearing loss Mother    Hypertension Father    Prostate cancer Father    Cancer Father    Heart disease Neg Hx    Hyperlipidemia Neg Hx    Kidney disease Neg Hx    Early death Neg Hx    Stroke Neg Hx     Prior to Admission medications   Medication Sig Start Date End Date Taking? Authorizing Provider  albuterol  (VENTOLIN  HFA) 108 (90 Base) MCG/ACT inhaler Inhale 1-2 puffs into the lungs every 6 (six) hours as needed for wheezing or shortness of breath. Please instruct 08/24/21   Berneta Elsie Sayre, MD  buPROPion  (WELLBUTRIN  XL) 150 MG 24 hr tablet Take 1 tablet by mouth once daily 09/17/23   Berneta Elsie Sayre, MD  carvedilol  (COREG ) 25 MG tablet TAKE 1 TABLET BY MOUTH TWICE DAILY FOR BLOOD PRESSURE 09/17/23   Lavona Agent, MD  ciprofloxacin  (CIPRO ) 750 MG tablet Take 1 tablet (750 mg total) by mouth 2 (two) times daily. 10/03/23   Brown, Fallon E, NP  cyanocobalamin  (VITAMIN B12) 1000 MCG tablet Take 1 tablet (1,000 mcg total) by mouth daily. 09/06/23   Berneta Elsie Sayre, MD  FLUoxetine  (PROZAC ) 40 MG capsule Take 1 capsule (40 mg total) by mouth daily. 06/24/21   Berneta Elsie Sayre, MD  gabapentin  (NEURONTIN ) 400 MG capsule Take 1 capsule (400 mg total) by mouth 3 (three) times daily. 10/03/23   Berneta Elsie Sayre, MD  levothyroxine  (SYNTHROID )  112 MCG tablet TAKE 1 TABLET BY MOUTH ONCE DAILY BEFORE BREAKFAST 08/10/23   Berneta Elsie Sayre, MD  spironolactone  (ALDACTONE ) 50 MG tablet Take 1 tablet (50 mg total) by mouth daily. 04/13/23   Lavona Agent, MD  sulfamethoxazole -trimethoprim  (BACTRIM  DS) 800-160 MG tablet Take 1 tablet by mouth 2 (two) times daily. 10/11/23   Brown, Fallon E, NP  Torsemide  60 MG TABS Take 60 mg by mouth daily. 03/29/22   Daneen Damien BROCKS, NP  Vitamin D , Ergocalciferol , (DRISDOL ) 1.25 MG (50000 UNIT) CAPS capsule Take 1 capsule (50,000 Units total) by mouth every 7 (seven) days. 07/26/22   Berneta Elsie Sayre, MD     Physical Exam: Vitals:   10/23/23 1959 10/23/23 2153 10/24/23 0442 10/24/23 0755  BP: (!) 149/67  (!) 157/82 135/68  Pulse: 86  89 86  Resp: 19  16 16   Temp: 97.9 F (36.6 C)  98.4 F (36.9 C) 98.1 F (36.7 C)  TempSrc: Oral   Oral  SpO2: 96%  98% 97%  Weight: (!) 166.5 kg     Height:  5' 5 (1.651 m)      Physical Exam   Constitutional: Alert, awake, calm, comfortable HEENT: Neck supple Respiratory: Clear to auscultation B/L, no wheezing, no rales.  Cardiovascular: Regular rate and rhythm, no murmurs / rubs / gallops. No extremity edema. 2+ pedal pulses. No carotid bruits.  Abdomen: Soft, no tenderness, Bowel sounds positive.  Musculoskeletal: Bilateral lower extremity lymphedema and weeping Skin: Both legs are wrapped, per chart she has some ulcers on the right lower extremity skin. Neurologic: CN 2-12 grossly intact. Sensation intact, No focal deficit identified Psychiatric: Alert and oriented x 3. Normal mood.    Labs on Admission: I have personally reviewed following labs and imaging studies  CBC: Recent Labs  Lab 10/23/23 2023  WBC 3.4*  HGB 11.1*  HCT 33.3*  MCV 109.2*  PLT 283   Basic Metabolic Panel: Recent Labs  Lab 10/23/23 2023 10/24/23 0432  NA 133* 135  K 4.6 5.1  CL 104 102  CO2 20* 24  GLUCOSE 99 116*  BUN 15 15  CREATININE 1.10* 1.09*  CALCIUM 9.0 8.8*  MG 2.0  --    GFR: Estimated Creatinine Clearance: 100.4 mL/min (A) (by C-G formula based on SCr of 1.09 mg/dL (H)). Liver Function Tests: Recent Labs  Lab 10/23/23 2023  AST 26  ALT 13  ALKPHOS 52  BILITOT 0.6  PROT 9.6*  ALBUMIN 4.0   No results for input(s): LIPASE, AMYLASE in the last 168 hours. No results for input(s): AMMONIA in the last 168 hours. Coagulation Profile: No results for input(s): INR, PROTIME in the last 168 hours. Cardiac Enzymes: No results for input(s): CKTOTAL, CKMB, CKMBINDEX, TROPONINI, TROPONINIHS in the last 168 hours. BNP  (last 3 results) Recent Labs    08/27/23 1023 10/23/23 2023  BNP 8.8 10.2   HbA1C: No results for input(s): HGBA1C in the last 72 hours. CBG: No results for input(s): GLUCAP in the last 168 hours. Lipid Profile: No results for input(s): CHOL, HDL, LDLCALC, TRIG, CHOLHDL, LDLDIRECT in the last 72 hours. Thyroid  Function Tests: No results for input(s): TSH, T4TOTAL, FREET4, T3FREE, THYROIDAB in the last 72 hours. Anemia Panel: No results for input(s): VITAMINB12, FOLATE, FERRITIN, TIBC, IRON, RETICCTPCT in the last 72 hours. Urine analysis:    Component Value Date/Time   COLORURINE YELLOW 11/08/2020 1020   APPEARANCEUR Sl Cloudy (A) 11/08/2020 1020   LABSPEC 1.015 11/08/2020 1020   PHURINE  7.5 11/08/2020 1020   GLUCOSEU NEGATIVE 11/08/2020 1020   HGBUR NEGATIVE 11/08/2020 1020   BILIRUBINUR NEGATIVE 11/08/2020 1020   KETONESUR NEGATIVE 11/08/2020 1020   PROTEINUR NEGATIVE 05/12/2020 2243   UROBILINOGEN 1.0 11/08/2020 1020   NITRITE NEGATIVE 11/08/2020 1020   LEUKOCYTESUR NEGATIVE 11/08/2020 1020    Radiological Exams on Admission: I have personally reviewed images No results found.  EKG: N/A    Assessment/Plan Principal Problem:   Cellulitis of leg Active Problems:   Essential hypertension   Depression   Morbid obesity with BMI of 60.0-69.9, adult (HCC)   GERD (gastroesophageal reflux disease)   OSA (obstructive sleep apnea)   Venous ulcer (HCC)    Assessment and Plan: This is a 48 year old morbidly obese female W/PMH of HTN, bilateral lower EXTR lymphedema, GERD, depression, obstructive sleep apnea who was brought into vascular service for right lower extremity cellulitis and weeping.    1.  Bilateral lower extremity lymphedema - It appears that she has lymphedema and fluid overload. - Since her home medication list has hydrochlorothiazide  and spironolactone . - I will add Lasix  40 mg twice a day, daily weight, input  output charting - Echocardiogram to make sure she does not have acute congestive heart failure rule out right-sided heart failure  2.  Venous ulcer - There is no documentation of infection - No need for acute antibiotic at this point - Further management for vascular team.  3.  Venous insufficiency - I see that she has been prescribed an Radio broadcast assistant - Plans for primary team  4.  Essential hypertension - Patient was on Coreg , hydrochlorothiazide  and spironolactone  - Will continue Coreg  25 mg twice a day and add Lasix  40 mg twice daily. - I do not see hydrochlorothiazide  and aspirin started.  I will hold off hydrochlorothiazide  and start on spironolactone   5.  Morbid obesity - Extensive discussion was done regarding weight reduction. - Patient stated that she had discussed with primary care and they have not prescribed her weight reduction medications. - She will work with primary care physician.  6.  Depression Continue current regimen with bupropion  and fluoxetine  as prescribed  7.  Obstructive sleep apnea - She may need to be evaluated and may need CPAP machine as outpatient.   8.  Hypothyroidism - Continue levothyroxine  112 mcg daily   Thank you for the opportunity to see this patient.  Hospitalist team will continue to follow-up.   DVT prophylaxis: Lovenox  Code Status: Full Code Family Communication: N/A Disposition Plan: Home Consults called: Vascular Admission status: Inpatient, Med-Surg   Nena Rebel, MD [facility] 10/24/2023, 1:29 PM

## 2023-10-25 ENCOUNTER — Inpatient Hospital Stay: Admission: RE | Admit: 2023-10-25 | Source: Ambulatory Visit | Admitting: Vascular Surgery

## 2023-10-25 ENCOUNTER — Inpatient Hospital Stay (HOSPITAL_COMMUNITY)
Admission: RE | Admit: 2023-10-25 | Discharge: 2023-10-25 | Disposition: A | Discharge status: Home / Self Care | Source: Ambulatory Visit | Attending: Hospitalist | Admitting: Hospitalist

## 2023-10-25 DIAGNOSIS — I890 Lymphedema, not elsewhere classified: Secondary | ICD-10-CM

## 2023-10-25 DIAGNOSIS — I83009 Varicose veins of unspecified lower extremity with ulcer of unspecified site: Secondary | ICD-10-CM | POA: Diagnosis not present

## 2023-10-25 DIAGNOSIS — L97909 Non-pressure chronic ulcer of unspecified part of unspecified lower leg with unspecified severity: Secondary | ICD-10-CM

## 2023-10-25 DIAGNOSIS — L03115 Cellulitis of right lower limb: Secondary | ICD-10-CM | POA: Diagnosis not present

## 2023-10-25 DIAGNOSIS — I5031 Acute diastolic (congestive) heart failure: Secondary | ICD-10-CM | POA: Diagnosis not present

## 2023-10-25 DIAGNOSIS — Z6841 Body Mass Index (BMI) 40.0 and over, adult: Secondary | ICD-10-CM

## 2023-10-25 LAB — ECHOCARDIOGRAM COMPLETE
AR max vel: 2.01 cm2
AV Area VTI: 2.22 cm2
AV Area mean vel: 1.95 cm2
AV Mean grad: 6.5 mmHg
AV Peak grad: 11.6 mmHg
Ao pk vel: 1.7 m/s
Area-P 1/2: 2.93 cm2
Height: 65 in
MV VTI: 2.5 cm2
S' Lateral: 3.1 cm
Weight: 6084.7 [oz_av]

## 2023-10-25 LAB — CBC
HCT: 30.8 % — ABNORMAL LOW (ref 36.0–46.0)
Hemoglobin: 9.9 g/dL — ABNORMAL LOW (ref 12.0–15.0)
MCH: 35.6 pg — ABNORMAL HIGH (ref 26.0–34.0)
MCHC: 32.1 g/dL (ref 30.0–36.0)
MCV: 110.8 fL — ABNORMAL HIGH (ref 80.0–100.0)
Platelets: 243 K/uL (ref 150–400)
RBC: 2.78 MIL/uL — ABNORMAL LOW (ref 3.87–5.11)
RDW: 12.5 % (ref 11.5–15.5)
WBC: 3.5 K/uL — ABNORMAL LOW (ref 4.0–10.5)
nRBC: 0 % (ref 0.0–0.2)

## 2023-10-25 LAB — MAGNESIUM: Magnesium: 2.1 mg/dL (ref 1.7–2.4)

## 2023-10-25 NOTE — Progress Notes (Signed)
*  PRELIMINARY RESULTS* Echocardiogram 2D Echocardiogram has been performed.  Hannah Orr 10/25/2023, 2:44 PM

## 2023-10-25 NOTE — Progress Notes (Signed)
*  PRELIMINARY RESULTS* Echocardiogram 2D Echocardiogram has been performed.  Floydene Harder 10/25/2023, 2:43 PM

## 2023-10-25 NOTE — Plan of Care (Signed)

## 2023-10-25 NOTE — Progress Notes (Signed)
 Progress Note    10/25/2023 8:44 AM * No surgery found *  Subjective:  Hannah Orr is a 48 y.o. ([birth year]) female who presents with chief complaint of leg pain and swelling.  She was last seen in the office by Dr. Cordella Shawl MD October 22, 2023 for lymphedema that was out of control associated with ulceration and cellulitis of her right lower extremity.  She had been failing to improve and endorses weeping of the wound of her right lower extremity.  She endorses this is steadily worsening to the point where it soak the carpet at home.  She was last seen in the emergency room on 08/27/2023 for wound and was given some Rocephin  as well as Keflex .  She was then seen in our office about 2 weeks later and at that time there was a discussion of the patient being admitted to the hospital for this condition.  She did not want to be hospitalized and went home.  She was followed by home health for bilateral lower extremity Unna boot wraps and despite all this has not been improving therefore admitted to the hospital.   Vitals:   10/25/23 0415 10/25/23 0810  BP: (!) 143/83 121/69  Pulse: 86 90  Resp: 20 17  Temp: 97.9 F (36.6 C) 98 F (36.7 C)  SpO2: 94% 95%   Physical Exam: Cardiac:  RRR, normal S1 and S2.  No murmurs Lungs: Nonlabored breathing when at rest, clear throughout on auscultation but diminished in the bases. Incisions: None Extremities: Bilateral lower extremities with massive lymphedema, swelling and ulceration to her right lower extremity.  Wraps are in place. Abdomen: Morbid obesity.  Bowel sounds on auscultation are positive.  Abdomen soft and nontender. Neurologic: Alert and oriented x 3, answers all questions and follows commands.  CBC    Component Value Date/Time   WBC 3.5 (L) 10/25/2023 0652   RBC 2.78 (L) 10/25/2023 0652   HGB 9.9 (L) 10/25/2023 0652   HGB 12.0 07/28/2020 1205   HCT 30.8 (L) 10/25/2023 0652   PLT 243 10/25/2023 0652   PLT 208 07/28/2020  1205   MCV 110.8 (H) 10/25/2023 0652   MCH 35.6 (H) 10/25/2023 0652   MCHC 32.1 10/25/2023 0652   RDW 12.5 10/25/2023 0652   LYMPHSABS 1.3 08/27/2023 1023   MONOABS 0.5 08/27/2023 1023   EOSABS 0.2 08/27/2023 1023   BASOSABS 0.0 08/27/2023 1023    BMET    Component Value Date/Time   NA 136 10/24/2023 1443   NA 134 03/25/2021 1412   K 4.7 10/24/2023 1443   CL 102 10/24/2023 1443   CO2 26 10/24/2023 1443   GLUCOSE 121 (H) 10/24/2023 1443   BUN 16 10/24/2023 1443   BUN 9 03/25/2021 1412   CREATININE 1.06 (H) 10/24/2023 1443   CREATININE 0.75 07/28/2020 1205   CALCIUM 8.9 10/24/2023 1443   GFRNONAA >60 10/24/2023 1443   GFRNONAA >60 07/28/2020 1205    INR No results found for: INR  No intake or output data in the 24 hours ending 10/25/23 0844   Assessment/Plan:  48 y.o. female who was admitted to [facility] medical ward with bilateral lower extremity massive lymphedema, swelling, pain and ulcerations to her legs.* No surgery found *   PLAN Antibiotics as prescribed. Lasix  for swelling reduction. Patient to have bilateral lower extremities elevated 23 hours a day. Ambulate only to the bathroom. Pain medication as needed  DVT prophylaxis: Lovenox  80 mg subcu every 24.   [name]  [facility] 10/25/2023 8:44 AM

## 2023-10-25 NOTE — Progress Notes (Signed)
  Hospitalist consult daily PROGRESS NOTE    Hannah Orr  FMW:[id number] DOB: 04/02/1975 DOA: 10/23/2023 PCP: Berneta Elsie Sayre, MD  214A/214A-AA  LOS: 2 days   Brief hospital course:   Assessment & Plan: Hannah Orr is a pleasant 48 y.o. female with medical history significant for depression, HTN, morbid obesity, bilateral lower extremity lymphedema who was admitted to vascular service for bilateral leg pain and swelling.  She was treated for right lower extremity cellulitis and ulceration with oral antibiotic.  She did not get better and was admitted to vascular service for right lower extremity venous ulceration and venous insufficiency.  Hospitalist service was consulted for concern for uncontrolled swelling and ruling out right heart failure.    1.  Bilateral lower extremity lymphedema - It appears that she has lymphedema and fluid overload. - Since her home medication list has hydrochlorothiazide  and spironolactone . --started on IV lasix  40 BID --Echo unremarkable --cont IV lasix  40 BID   2.  Venous ulcer and insufficiency  - There is no documentation of infection - No need for acute antibiotic at this point - Further management for vascular team.   4.  Essential hypertension --cont coreg , aldactone  --cont IV lasix    5.  Morbid obesity, BMI 63 - Patient stated that she had discussed with primary care and they have not prescribed her weight reduction medications. - She will work with primary care physician.   6.  Depression --cont Wellbutrin  and Prozac    7.  Obstructive sleep apnea --need outpatient sleep study     8.  Hypothyroidism - Continue levothyroxine  112 mcg daily    DVT prophylaxis: Lovenox  SQ Code Status: Full code  Family Communication:  Level of care: Med-Surg Dispo:   The patient is from: home Anticipated d/c is to: home Anticipated d/c date is: 2-3 days   Subjective and Interval History:  Pt complained of knee pain which is  chronic.   Objective: Vitals:   10/25/23 0415 10/25/23 0459 10/25/23 0810 10/25/23 1549  BP: (!) 143/83  121/69 137/87  Pulse: 86  90 72  Resp: 20  17 17   Temp: 97.9 F (36.6 C)  98 F (36.7 C) 97.9 F (36.6 C)  TempSrc: Oral  Oral   SpO2: 94%  95% 99%  Weight:  (!) 172.5 kg    Height:        Intake/Output Summary (Last 24 hours) at 10/25/2023 1729 Last data filed at 10/25/2023 1419 Gross per 24 hour  Intake 480 ml  Output --  Net 480 ml   Filed Weights   10/23/23 1959 10/25/23 0459  Weight: (!) 166.5 kg (!) 172.5 kg    Examination:   Constitutional: NAD, AAOx3 HEENT: conjunctivae and lids normal, EOMI CV: No cyanosis.   RESP: normal respiratory effort, on RA Neuro: II - XII grossly intact.   Psych: Normal mood and affect.  Appropriate judgement and reason   Data Reviewed: I have personally reviewed labs and imaging studies  Time spent: 35 minutes  Ellouise Haber, MD [facility] If 7PM-7AM, please contact night-coverage 10/25/2023, 5:29 PM

## 2023-10-26 DIAGNOSIS — L03115 Cellulitis of right lower limb: Secondary | ICD-10-CM | POA: Diagnosis not present

## 2023-10-26 DIAGNOSIS — I890 Lymphedema, not elsewhere classified: Secondary | ICD-10-CM | POA: Diagnosis not present

## 2023-10-26 DIAGNOSIS — I83009 Varicose veins of unspecified lower extremity with ulcer of unspecified site: Secondary | ICD-10-CM | POA: Diagnosis not present

## 2023-10-26 DIAGNOSIS — L97909 Non-pressure chronic ulcer of unspecified part of unspecified lower leg with unspecified severity: Secondary | ICD-10-CM | POA: Diagnosis not present

## 2023-10-26 LAB — CBC
HCT: 32 % — ABNORMAL LOW (ref 36.0–46.0)
Hemoglobin: 10.1 g/dL — ABNORMAL LOW (ref 12.0–15.0)
MCH: 35.9 pg — ABNORMAL HIGH (ref 26.0–34.0)
MCHC: 31.6 g/dL (ref 30.0–36.0)
MCV: 113.9 fL — ABNORMAL HIGH (ref 80.0–100.0)
Platelets: 249 K/uL (ref 150–400)
RBC: 2.81 MIL/uL — ABNORMAL LOW (ref 3.87–5.11)
RDW: 12.7 % (ref 11.5–15.5)
WBC: 3.6 K/uL — ABNORMAL LOW (ref 4.0–10.5)
nRBC: 0 % (ref 0.0–0.2)

## 2023-10-26 LAB — BASIC METABOLIC PANEL WITH GFR
Anion gap: 9 (ref 5–15)
BUN: 26 mg/dL — ABNORMAL HIGH (ref 6–20)
CO2: 27 mmol/L (ref 22–32)
Calcium: 9.2 mg/dL (ref 8.9–10.3)
Chloride: 99 mmol/L (ref 98–111)
Creatinine, Ser: 1.28 mg/dL — ABNORMAL HIGH (ref 0.44–1.00)
GFR, Estimated: 52 mL/min — ABNORMAL LOW (ref >60)
Glucose, Bld: 88 mg/dL (ref 70–99)
Potassium: 4.3 mmol/L (ref 3.5–5.1)
Sodium: 135 mmol/L (ref 135–145)

## 2023-10-26 LAB — MAGNESIUM: Magnesium: 2.1 mg/dL (ref 1.7–2.4)

## 2023-10-26 NOTE — Progress Notes (Signed)
  Hospitalist consult daily PROGRESS NOTE    Hannah Orr  FMW:[id number] DOB: May 04, 1975 DOA: 10/23/2023 PCP: Berneta Elsie Sayre, MD  214A/214A-AA  LOS: 3 days   Brief hospital course:   Assessment & Plan: Hannah Orr is a pleasant 48 y.o. female with medical history significant for depression, HTN, morbid obesity, bilateral lower extremity lymphedema who was admitted to vascular service for bilateral leg pain and swelling.  She was treated for right lower extremity cellulitis and ulceration with oral antibiotic.  She did not get better and was admitted to vascular service for right lower extremity venous ulceration and venous insufficiency.  Hospitalist service was consulted for concern for uncontrolled swelling and ruling out right heart failure.    1.  Bilateral lower extremity lymphedema - It appears that she has lymphedema and fluid overload. --started on IV lasix  40 BID, however, Cr increased just after 3 doses.  Pt also refused it this morning. --Echo unremarkable --hold further IV lasix  --cont home aldactone  --need to loose weight   2.  Venous ulcer and insufficiency  - There is no documentation of infection - No need for acute antibiotic at this point - Further management for vascular team.   4.  Essential hypertension --cont coreg , aldactone  --hold further IV lasix    5.  Morbid obesity, BMI 63 - Patient stated that she had discussed with primary care and they have not prescribed her weight reduction medications. - She will work with primary care physician.   6.  Depression --cont Wellbutrin  and Prozac    7.  Obstructive sleep apnea --need outpatient sleep study   8.  Hypothyroidism --cont Synthroid     DVT prophylaxis: Lovenox  SQ Code Status: Full code  Family Communication: mother updated at bedside today Level of care: Med-Surg Dispo:   Per primary team.   Subjective and Interval History:  Pt said she did make more urine in the past day,  but refused IV lasix  this morning due to difficult getting up to urinate.  Cr also had increased after diuresis.   Objective: Vitals:   10/25/23 2044 10/26/23 0423 10/26/23 0830 10/26/23 1507  BP: 139/78 (!) 142/71 (!) 143/91 123/75  Pulse: 72 86 91 84  Resp: 16 20 18 16   Temp: 97.6 F (36.4 C) 98.5 F (36.9 C) 98.7 F (37.1 C) 98.8 F (37.1 C)  TempSrc:   Oral Oral  SpO2: 93% 93% 94% 100%  Weight:      Height:        Intake/Output Summary (Last 24 hours) at 10/26/2023 1807 Last data filed at 10/26/2023 1405 Gross per 24 hour  Intake 540 ml  Output 700 ml  Net -160 ml   Filed Weights   10/23/23 1959 10/25/23 0459  Weight: (!) 166.5 kg (!) 172.5 kg    Examination:   Constitutional: NAD, AAOx3 HEENT: conjunctivae and lids normal, EOMI CV: No cyanosis.   RESP: normal respiratory effort, on RA Extremities: BLE with woody edema  SKIN: warm, dry Neuro: II - XII grossly intact.   Psych: Normal mood and affect.  Appropriate judgement and reason   Data Reviewed: I have personally reviewed labs and imaging studies  Time spent: 35 minutes  Ellouise Haber, MD [facility] If 7PM-7AM, please contact night-coverage 10/26/2023, 6:07 PM

## 2023-10-26 NOTE — Plan of Care (Signed)

## 2023-10-26 NOTE — Progress Notes (Signed)
 Progress Note    10/26/2023 4:22 PM * No surgery found *  Subjective:  Hannah Orr is a 48 y.o. ([birth year]) female who presents with chief complaint of leg pain and swelling.  She was last seen in the office by Dr. Cordella Shawl MD October 22, 2023 for lymphedema that was out of control associated with ulceration and cellulitis of her right lower extremity.  She had been failing to improve and endorses weeping of the wound of her right lower extremity.  She endorses this is steadily worsening to the point where it soak the carpet at home.  She was last seen in the emergency room on 08/27/2023 for wound and was given some Rocephin  as well as Keflex .  She was then seen in our office about 2 weeks later and at that time there was a discussion of the patient being admitted to the hospital for this condition.  She did not want to be hospitalized and went home.  She was followed by home health for bilateral lower extremity Unna boot wraps and despite all this has not been improving therefore admitted to the hospital.   This afternoon patient is resting comfortably in bed.  She just returned from the bathroom and states that the Lasix  is working.  She endorses no changes to her lower extremities.  Unna boot wraps remain in place.  Vitals all remained stable.   Vitals:   10/26/23 0830 10/26/23 1507  BP: (!) 143/91 123/75  Pulse: 91 84  Resp: 18 16  Temp: 98.7 F (37.1 C) 98.8 F (37.1 C)  SpO2: 94% 100%   Physical Exam: Cardiac:  RRR, normal S1 and S2.  No murmurs Lungs: Nonlabored breathing when at rest, clear throughout on auscultation but diminished in the bases. Incisions: None Extremities: Bilateral lower extremities with massive lymphedema, swelling and ulceration to her right lower extremity.  Wraps are in place. Abdomen: Morbid obesity.  Bowel sounds on auscultation are positive.  Abdomen soft and nontender. Neurologic: Alert and oriented x 3, answers all questions and follows  commands.   CBC    Component Value Date/Time   WBC 3.6 (L) 10/26/2023 0530   RBC 2.81 (L) 10/26/2023 0530   HGB 10.1 (L) 10/26/2023 0530   HGB 12.0 07/28/2020 1205   HCT 32.0 (L) 10/26/2023 0530   PLT 249 10/26/2023 0530   PLT 208 07/28/2020 1205   MCV 113.9 (H) 10/26/2023 0530   MCH 35.9 (H) 10/26/2023 0530   MCHC 31.6 10/26/2023 0530   RDW 12.7 10/26/2023 0530   LYMPHSABS 1.3 08/27/2023 1023   MONOABS 0.5 08/27/2023 1023   EOSABS 0.2 08/27/2023 1023   BASOSABS 0.0 08/27/2023 1023    BMET    Component Value Date/Time   NA 135 10/26/2023 0530   NA 134 03/25/2021 1412   K 4.3 10/26/2023 0530   CL 99 10/26/2023 0530   CO2 27 10/26/2023 0530   GLUCOSE 88 10/26/2023 0530   BUN 26 (H) 10/26/2023 0530   BUN 9 03/25/2021 1412   CREATININE 1.28 (H) 10/26/2023 0530   CREATININE 0.75 07/28/2020 1205   CALCIUM 9.2 10/26/2023 0530   GFRNONAA 52 (L) 10/26/2023 0530   GFRNONAA >60 07/28/2020 1205    INR No results found for: INR   Intake/Output Summary (Last 24 hours) at 10/26/2023 1622 Last data filed at 10/26/2023 1405 Gross per 24 hour  Intake 540 ml  Output 1750 ml  Net -1210 ml     Assessment/Plan:  48 y.o. female  who was admitted to [facility] medical ward with bilateral lower extremity massive lymphedema, swelling, pain and ulcerations to her legs.* No surgery found *  * No surgery found *   PLAN Bilateral lower extremity Unna boot wraps changed today at the bedside.  Patient's bilateral feet and lower extremities with less swelling today.  Ulceration to right posterior calf remains the same.  Plan to change Unna wraps again on Tuesday morning.  Antibiotics as prescribed. Lasix  for swelling reduction. Elevation to patient's bilateral lower extremities 23 hours a day.  Okay to ambulate to the bathroom and back but nothing more. Pain medication as needed  DVT prophylaxis:  Lovenox  80 mg subcu every 24.    Hannah Orr [facility] 10/26/2023 4:22 PM

## 2023-10-27 DIAGNOSIS — I890 Lymphedema, not elsewhere classified: Secondary | ICD-10-CM | POA: Diagnosis not present

## 2023-10-27 LAB — CREATININE, SERUM
Creatinine, Ser: 0.85 mg/dL (ref 0.44–1.00)
GFR, Estimated: >60 mL/min (ref >60)

## 2023-10-27 MED ORDER — DIPHENHYDRAMINE HCL 25 MG PO CAPS
25.0000 mg | ORAL_CAPSULE | Freq: Four times a day (QID) | ORAL | Status: DC | PRN
  Administered 2023-10-27 – 2023-10-30 (×4): 25 mg via ORAL
  Filled 2023-10-27 (×4): qty 1

## 2023-10-27 MED ORDER — FUROSEMIDE 10 MG/ML IJ SOLN
40.0000 mg | Freq: Once | INTRAMUSCULAR | Status: AC
  Administered 2023-10-27: 40 mg via INTRAVENOUS
  Filled 2023-10-27: qty 4

## 2023-10-27 NOTE — Progress Notes (Signed)
 Patient refusing to take the lasix  that is ordered for her. Educated her on the need for strict I/O and she is understanding of that.

## 2023-10-27 NOTE — Progress Notes (Signed)
  Hospitalist consult daily PROGRESS NOTE    Hannah Orr  FMW:[id number] DOB: 1975-07-20 DOA: 10/23/2023 PCP: Berneta Elsie Sayre, MD  214A/214A-AA  LOS: 4 days   Brief hospital course:   Assessment & Plan: Hannah Orr is a pleasant 48 y.o. female with medical history significant for depression, HTN, morbid obesity, bilateral lower extremity lymphedema who was admitted to vascular service for bilateral leg pain and swelling.  She was treated for right lower extremity cellulitis and ulceration with oral antibiotic.  She did not get better and was admitted to vascular service for right lower extremity venous ulceration and venous insufficiency.  Hospitalist service was consulted for concern for uncontrolled swelling and ruling out right heart failure.    1.  Bilateral lower extremity lymphedema - It appears that she has lymphedema and fluid overload. --started on IV lasix  40 BID, however, Cr increased just after 3 doses.  Lasix  held --Echo unremarkable --resume IV lasix  40 today since Cr much improved --cont home aldactone  --need to loose weight   2.  Venous ulcer and insufficiency  - There is no documentation of infection - No need for acute antibiotic at this point --compression wrap per vascular surgery   4.  Essential hypertension --cont coreg , aldactone  --resume IV lasix  40 today since Cr much improved   5.  Morbid obesity, BMI 63 - Patient stated that she had discussed with primary care and they have not prescribed her weight reduction medications. - She will work with primary care physician.   6.  Depression --cont Wellbutrin  and Prozac    7.  Obstructive sleep apnea --need outpatient sleep study   8.  Hypothyroidism --cont Synthroid     DVT prophylaxis: Lovenox  SQ Code Status: Full code  Family Communication: mother updated at bedside today Level of care: Med-Surg Dispo:   Per primary team.   Subjective and Interval History:  Pt complained of  itching in her legs.   Objective: Vitals:   10/27/23 0359 10/27/23 0500 10/27/23 0907 10/27/23 1554  BP: 136/76  (!) 141/83 123/69  Pulse: 89  94 72  Resp: 18  18 20   Temp: 98.5 F (36.9 C)  98 F (36.7 C) 98.5 F (36.9 C)  TempSrc: Oral  Oral Oral  SpO2: 94%  96% 98%  Weight:  (!) 170.9 kg    Height:        Intake/Output Summary (Last 24 hours) at 10/27/2023 1732 Last data filed at 10/27/2023 1600 Gross per 24 hour  Intake 360 ml  Output 300 ml  Net 60 ml   Filed Weights   10/23/23 1959 10/25/23 0459 10/27/23 0500  Weight: (!) 166.5 kg (!) 172.5 kg (!) 170.9 kg    Examination:   Constitutional: NAD, AAOx3 HEENT: conjunctivae and lids normal, EOMI CV: No cyanosis.   RESP: normal respiratory effort, on RA Extremities: woody edema in BLE SKIN: warm, dry Neuro: II - XII grossly intact.   Psych: Normal mood and affect.  Appropriate judgement and reason   Data Reviewed: I have personally reviewed labs and imaging studies  Time spent: 35 minutes  Hannah Haber, MD [facility] If 7PM-7AM, please contact night-coverage 10/27/2023, 5:32 PM

## 2023-10-27 NOTE — Progress Notes (Signed)
 Purewick in place per MD for patient getting lasix  and needed accurate output

## 2023-10-27 NOTE — Plan of Care (Signed)
  Problem: Education: Goal: Knowledge of General Education information will improve Description: Including pain rating scale, medication(s)/side effects and non-pharmacologic comfort measures Outcome: Progressing   Problem: Health Behavior/Discharge Planning: Goal: Ability to manage health-related needs will improve Outcome: Progressing   Problem: Clinical Measurements: Goal: Ability to maintain clinical measurements within normal limits will improve Outcome: Progressing Goal: Will remain free from infection Outcome: Progressing Goal: Diagnostic test results will improve Outcome: Progressing Goal: Respiratory complications will improve Outcome: Progressing Goal: Cardiovascular complication will be avoided Outcome: Progressing   Problem: Activity: Goal: Risk for activity intolerance will decrease Outcome: Progressing   Problem: Nutrition: Goal: Adequate nutrition will be maintained Outcome: Progressing   Problem: Elimination: Goal: Will not experience complications related to bowel motility Outcome: Progressing Goal: Will not experience complications related to urinary retention Outcome: Progressing   Problem: Pain Managment: Goal: General experience of comfort will improve and/or be controlled Outcome: Progressing   Problem: Safety: Goal: Ability to remain free from injury will improve Outcome: Progressing   Problem: Skin Integrity: Goal: Risk for impaired skin integrity will decrease Outcome: Progressing   Problem: Clinical Measurements: Goal: Ability to avoid or minimize complications of infection will improve Outcome: Progressing   Problem: Skin Integrity: Goal: Skin integrity will improve Outcome: Progressing

## 2023-10-27 NOTE — Plan of Care (Signed)

## 2023-10-27 NOTE — Progress Notes (Signed)
 Assumed care of patient at this time

## 2023-10-27 NOTE — Progress Notes (Signed)
    Subjective  -   Feels like there is some drainage on her leg   Physical Exam:  Leg wrapped in compression       Assessment/Plan:    Discussed the importance of bedrest and leg elevation .  Will have hospitalist address strategies for weight loss including medications Anticipating discharge on Tuesday Continue IV antibiotics  [name] 10/27/2023 12:43 PM --  Vitals:   10/27/23 0359 10/27/23 0907  BP: 136/76 (!) 141/83  Pulse: 89 94  Resp: 18 18  Temp: 98.5 F (36.9 C) 98 F (36.7 C)  SpO2: 94% 96%    Intake/Output Summary (Last 24 hours) at 10/27/2023 1243 Last data filed at 10/27/2023 0900 Gross per 24 hour  Intake 120 ml  Output --  Net 120 ml     Laboratory CBC    Component Value Date/Time   WBC 3.6 (L) 10/26/2023 0530   HGB 10.1 (L) 10/26/2023 0530   HGB 12.0 07/28/2020 1205   HCT 32.0 (L) 10/26/2023 0530   PLT 249 10/26/2023 0530   PLT 208 07/28/2020 1205    BMET    Component Value Date/Time   NA 135 10/26/2023 0530   NA 134 03/25/2021 1412   K 4.3 10/26/2023 0530   CL 99 10/26/2023 0530   CO2 27 10/26/2023 0530   GLUCOSE 88 10/26/2023 0530   BUN 26 (H) 10/26/2023 0530   BUN 9 03/25/2021 1412   CREATININE 0.85 10/27/2023 0532   CREATININE 0.75 07/28/2020 1205   CALCIUM 9.2 10/26/2023 0530   GFRNONAA >60 10/27/2023 0532   GFRNONAA >60 07/28/2020 1205    COAG No results found for: INR, PROTIME No results found for: PTT  Antibiotics Anti-infectives (From admission, onward)    None        V. Hannah Orr CLORE, M.D., [facility] [place] Office: [phone] Pager:  [phone]

## 2023-10-28 DIAGNOSIS — I890 Lymphedema, not elsewhere classified: Secondary | ICD-10-CM | POA: Diagnosis not present

## 2023-10-28 LAB — BASIC METABOLIC PANEL WITH GFR
Anion gap: 10 (ref 5–15)
BUN: 24 mg/dL — ABNORMAL HIGH (ref 6–20)
CO2: 27 mmol/L (ref 22–32)
Calcium: 9.3 mg/dL (ref 8.9–10.3)
Chloride: 100 mmol/L (ref 98–111)
Creatinine, Ser: 1.13 mg/dL — ABNORMAL HIGH (ref 0.44–1.00)
GFR, Estimated: >60 mL/min (ref >60)
Glucose, Bld: 95 mg/dL (ref 70–99)
Potassium: 4.4 mmol/L (ref 3.5–5.1)
Sodium: 137 mmol/L (ref 135–145)

## 2023-10-28 LAB — MAGNESIUM: Magnesium: 2 mg/dL (ref 1.7–2.4)

## 2023-10-28 NOTE — Progress Notes (Signed)
    Subjective  -  No complaints this morning   Physical Exam:  Both legs continue with compression wrap       Assessment/Plan:    Lymphedema with ulceration and drainage: Continue compression therapy and leg elevation  We discussed the need for weight loss.  Appreciate hospitalists assistance with diuresis  Hannah Orr 10/28/2023 9:45 AM --  Vitals:   10/28/23 0351 10/28/23 0808  BP: 129/78 131/85  Pulse: 77 87  Resp: 18 18  Temp: 97.7 F (36.5 C) (!) 97.4 F (36.3 C)  SpO2: 94% 97%    Intake/Output Summary (Last 24 hours) at 10/28/2023 0945 Last data filed at 10/27/2023 1800 Gross per 24 hour  Intake 240 ml  Output 700 ml  Net -460 ml     Laboratory CBC    Component Value Date/Time   WBC 3.6 (L) 10/26/2023 0530   HGB 10.1 (L) 10/26/2023 0530   HGB 12.0 07/28/2020 1205   HCT 32.0 (L) 10/26/2023 0530   PLT 249 10/26/2023 0530   PLT 208 07/28/2020 1205    BMET    Component Value Date/Time   NA 137 10/28/2023 0527   NA 134 03/25/2021 1412   K 4.4 10/28/2023 0527   CL 100 10/28/2023 0527   CO2 27 10/28/2023 0527   GLUCOSE 95 10/28/2023 0527   BUN 24 (H) 10/28/2023 0527   BUN 9 03/25/2021 1412   CREATININE 1.13 (H) 10/28/2023 0527   CREATININE 0.75 07/28/2020 1205   CALCIUM 9.3 10/28/2023 0527   GFRNONAA >60 10/28/2023 0527   GFRNONAA >60 07/28/2020 1205    COAG No results found for: INR, PROTIME No results found for: PTT  Antibiotics Anti-infectives (From admission, onward)    None        V. Malvina Serene CLORE, M.D., FACS Pager:  [phone]

## 2023-10-28 NOTE — Plan of Care (Signed)

## 2023-10-28 NOTE — Progress Notes (Signed)
  Hospitalist consult daily PROGRESS NOTE    Hannah Orr  FMW:[id number] DOB: [birth year] DOA: 10/23/2023 PCP: Hannah Elsie Sayre, MD  214A/214A-AA  LOS: 5 days   Brief hospital course:   Assessment & Plan: Hannah Orr is a pleasant 48 y.o. female with medical history significant for depression, HTN, morbid obesity, bilateral lower extremity lymphedema who was admitted to vascular service for bilateral leg pain and swelling.  She was treated for right lower extremity cellulitis and ulceration with oral antibiotic.  She did not get better and was admitted to vascular service for right lower extremity venous ulceration and venous insufficiency.  Hospitalist service was consulted for concern for uncontrolled swelling and ruling out right heart failure.    1.  Bilateral lower extremity lymphedema - It appears that she has lymphedema and fluid overload. --started on IV lasix  40 BID, however, Cr increased just after 3 doses.  Lasix  held --Echo unremarkable --Cr increased again after yesterday's IV lasix  40 --hold diuretics today --cont home aldactone  --PCP to prescribe weight-loss medication   2.  Venous ulcer and insufficiency  - There is no documentation of infection - No need for acute antibiotic at this point --compression wrap per vascular surgery   4.  Essential hypertension --cont coreg , aldactone  --IV lasix  40 as kidney function allows   5.  Morbid obesity, BMI 63 - Patient stated that she had discussed with primary care and they have not prescribed her weight reduction medications. - She will work with primary care physician.   6.  Depression --cont Wellbutrin  and Prozac    7.  Obstructive sleep apnea --need outpatient sleep study   8.  Hypothyroidism --cont Synthroid     DVT prophylaxis: Lovenox  SQ Code Status: Full code  Family Communication:  Level of care: Med-Surg Dispo:   Per primary team.   Subjective and Interval History:  Pt said she was  unsure if she urinated more with IV lasix  yesterday.  However, Cr increased again this morning.  Pt reported burning sensation in her legs.   Objective: Vitals:   10/27/23 2012 10/28/23 0351 10/28/23 0808 10/28/23 1449  BP: 125/63 129/78 131/85 135/63  Pulse: 68 77 87 74  Resp: 18 18 18 20   Temp: 98 F (36.7 C) 97.7 F (36.5 C) (!) 97.4 F (36.3 C) 97.6 F (36.4 C)  TempSrc: Oral Oral Oral Oral  SpO2: 96% 94% 97% 95%  Weight:  (!) 168.8 kg    Height:        Intake/Output Summary (Last 24 hours) at 10/28/2023 1649 Last data filed at 10/28/2023 1619 Gross per 24 hour  Intake 360 ml  Output 400 ml  Net -40 ml   Filed Weights   10/25/23 0459 10/27/23 0500 10/28/23 0351  Weight: (!) 172.5 kg (!) 170.9 kg (!) 168.8 kg    Examination:   Constitutional: NAD, AAOx3 HEENT: conjunctivae and lids normal, EOMI CV: No cyanosis.   RESP: normal respiratory effort, on RA Extremities: weedy edema in BLE, with compression wrap. SKIN: warm, dry Neuro: II - XII grossly intact.   Psych: Normal mood and affect.  Appropriate judgement and reason   Data Reviewed: I have personally reviewed labs and imaging studies  Time spent: 35 minutes  Ellouise Haber, MD [facility] If 7PM-7AM, please contact night-coverage 10/28/2023, 4:49 PM

## 2023-10-29 LAB — BASIC METABOLIC PANEL WITH GFR
Anion gap: 12 (ref 5–15)
BUN: 21 mg/dL — ABNORMAL HIGH (ref 6–20)
CO2: 26 mmol/L (ref 22–32)
Calcium: 9 mg/dL (ref 8.9–10.3)
Chloride: 98 mmol/L (ref 98–111)
Creatinine, Ser: 0.98 mg/dL (ref 0.44–1.00)
GFR, Estimated: >60 mL/min (ref >60)
Glucose, Bld: 95 mg/dL (ref 70–99)
Potassium: 4.1 mmol/L (ref 3.5–5.1)
Sodium: 136 mmol/L (ref 135–145)

## 2023-10-29 MED ORDER — FUROSEMIDE 10 MG/ML IJ SOLN
40.0000 mg | Freq: Once | INTRAMUSCULAR | Status: DC
  Filled 2023-10-29: qty 4

## 2023-10-29 NOTE — Progress Notes (Signed)
    Subjective  -   C/o itching and leg pain overnight   Physical Exam:  Compression wraps in palce       Assessment/Plan:   Lymphadema with ulcers:  continue compression dressing, change tomorrow Needs HH set up for home dressing changes Keep legs elevated Appreciate hospitalists assistance with medical issues.   I again addressed the importance of weight los  Wells Donaciano Range 10/29/2023 10:24 AM --  Vitals:   10/29/23 0402 10/29/23 0814  BP: 131/76 129/74  Pulse: 79 80  Resp: 17 18  Temp: 98.1 F (36.7 C) 97.9 F (36.6 C)  SpO2: 95% 95%    Intake/Output Summary (Last 24 hours) at 10/29/2023 1024 Last data filed at 10/28/2023 2100 Gross per 24 hour  Intake 1020 ml  Output --  Net 1020 ml     Laboratory CBC    Component Value Date/Time   WBC 3.6 (L) 10/26/2023 0530   HGB 10.1 (L) 10/26/2023 0530   HGB 12.0 07/28/2020 1205   HCT 32.0 (L) 10/26/2023 0530   PLT 249 10/26/2023 0530   PLT 208 07/28/2020 1205    BMET    Component Value Date/Time   NA 136 10/29/2023 0523   NA 134 03/25/2021 1412   K 4.1 10/29/2023 0523   CL 98 10/29/2023 0523   CO2 26 10/29/2023 0523   GLUCOSE 95 10/29/2023 0523   BUN 21 (H) 10/29/2023 0523   BUN 9 03/25/2021 1412   CREATININE 0.98 10/29/2023 0523   CREATININE 0.75 07/28/2020 1205   CALCIUM 9.0 10/29/2023 0523   GFRNONAA >60 10/29/2023 0523   GFRNONAA >60 07/28/2020 1205    COAG No results found for: INR, PROTIME No results found for: PTT  Antibiotics Anti-infectives (From admission, onward)    None        V. Malvina Serene CLORE, M.D., FACS Pager:  [phone]

## 2023-10-29 NOTE — Plan of Care (Signed)

## 2023-10-30 LAB — CBC
HCT: 31.2 % — ABNORMAL LOW (ref 36.0–46.0)
Hemoglobin: 10.2 g/dL — ABNORMAL LOW (ref 12.0–15.0)
MCH: 36.6 pg — ABNORMAL HIGH (ref 26.0–34.0)
MCHC: 32.7 g/dL (ref 30.0–36.0)
MCV: 111.8 fL — ABNORMAL HIGH (ref 80.0–100.0)
Platelets: 261 K/uL (ref 150–400)
RBC: 2.79 MIL/uL — ABNORMAL LOW (ref 3.87–5.11)
RDW: 12.2 % (ref 11.5–15.5)
WBC: 3.1 K/uL — ABNORMAL LOW (ref 4.0–10.5)
nRBC: 0 % (ref 0.0–0.2)

## 2023-10-30 LAB — BASIC METABOLIC PANEL WITH GFR
Anion gap: 8 (ref 5–15)
BUN: 23 mg/dL — ABNORMAL HIGH (ref 6–20)
CO2: 29 mmol/L (ref 22–32)
Calcium: 9 mg/dL (ref 8.9–10.3)
Chloride: 99 mmol/L (ref 98–111)
Creatinine, Ser: 1.01 mg/dL — ABNORMAL HIGH (ref 0.44–1.00)
GFR, Estimated: >60 mL/min (ref >60)
Glucose, Bld: 104 mg/dL — ABNORMAL HIGH (ref 70–99)
Potassium: 4.4 mmol/L (ref 3.5–5.1)
Sodium: 136 mmol/L (ref 135–145)

## 2023-10-30 NOTE — Discharge Instructions (Signed)
 Vascular surgery discharge instructions  Elevate bilateral lower extremities 23 hours a day.  Ambulate only when necessary.  Wraps to bilateral lower extremities to be changed weekly.  You are currently with a home health company.  Please call them when you get home to reestablish care for Unna boot wraps.  Follow-up with vein and vascular surgery clinic in 2 weeks as scheduled.

## 2023-10-30 NOTE — Discharge Summary (Signed)
 [facility]    Discharge Summary    Patient ID:  Hannah Orr MRN: [id number] DOB/AGE: Oct 24, 1975 48 y.o.  Admit date: 10/23/2023 Discharge date: 10/30/2023 Date of Surgery: * No surgery found * Surgeon: * Surgery not found *  Admission Diagnosis: Cellulitis of leg [L03.119]  Discharge Diagnoses:  Cellulitis of leg [L03.119]  Secondary Diagnoses: Past Medical History:  Diagnosis Date   Depression    Diabetes mellitus without complication (HCC)    Hypertension       Discharged Condition: good  HPI:  Patient presents to [facility] hospital for admission for bilateral lower extremity lymphedema with ulcerations and sores.  She is morbidly obese with extreme lymphedema.  Patient was hospitalized and placed in bed to defer dependent edema.  Patient was kept on bedrest 23 hours a day to help decrease swelling to her bilateral lower extremities.  Right lower extremity and left lower extremity are better today after week of wraps and on ambulation.  Hospital Course:  Hannah Orr is a 48 y.o. female is S/P Bilateral lower extremity Unna boot wraps with antibiotics.  Patient was given Lasix  plus home meds as a home diuretic to help reduce fluid overload and lymphedema to her lower extremities.  Bilateral lower extremity Unna boot wraps were changed twice while she was here in the hospital.  Last change was today with noticeable difference to the swelling to her feet and lower extremities.  Wraps were placed on again today.  Patient will go home and call home health nursing to reestablish care and continue wraps at home.  Patient will be discharged today to follow-up with vein and vascular clinic in 2 weeks for wound check.  Patient currently is not needing any anticoagulation at this point in time.  I spent greater than 60 minutes in preparing, implementing, teaching and discharging the patient.   Extubated: POD # 0 Physical Exam:  Alert notes x3, no  acute distress Face: Symmetrical.  Tongue is midline. Neck: Trachea is midline.  No swelling or bruising. Cardiovascular: Regular rate and rhythm Pulmonary: Clear to auscultation bilaterally Abdomen: Soft, nontender, nondistended Left lower extremity: Thigh soft.  Calf soft.  Extremities warm distally toes.  Hard to palpate pedal pulses however the foot is warm is her good capillary refill. Right lower extremity: Thigh soft.  Calf soft.  Extremities warm distally toes.  Hard to palpate pedal pulses however the foot is warm is her good capillary refill. Neurological: No deficits noted   Post-op wounds:  clean, dry, intact, draining, or healing well.  Unna boot wraps to bilateral lower extremities going home.  These were changed today on 10/30/2023.  Home health to resume care when she gets home for Unna boot wraps changed twice weekly.  Pt. Ambulating, voiding and taking PO diet without difficulty. Pt pain controlled with PO pain meds.  Labs:  As below  Complications: none  Consults:  Treatment Team:  Awanda [place], MD  Significant Diagnostic Studies: CBC Lab Results  Component Value Date   WBC 3.1 (L) 10/30/2023   HGB 10.2 (L) 10/30/2023   HCT 31.2 (L) 10/30/2023   MCV 111.8 (H) 10/30/2023   PLT 261 10/30/2023    BMET    Component Value Date/Time   NA 136 10/30/2023 0427   NA 134 03/25/2021 1412   K 4.4 10/30/2023 0427   CL 99 10/30/2023 0427   CO2 29 10/30/2023 0427   GLUCOSE 104 (H) 10/30/2023 0427   BUN 23 (H)  10/30/2023 0427   BUN 9 03/25/2021 1412   CREATININE 1.01 (H) 10/30/2023 0427   CREATININE 0.75 07/28/2020 1205   CALCIUM 9.0 10/30/2023 0427   GFRNONAA >60 10/30/2023 0427   GFRNONAA >60 07/28/2020 1205   COAG No results found for: INR, PROTIME   Disposition:  Discharge to :Home  Allergies as of 10/30/2023       Reactions   Benazepril Hcl Hypertension   cough   Benazepril Hcl    cough        Medication List     TAKE these medications     albuterol  108 (90 Base) MCG/ACT inhaler Commonly known as: VENTOLIN  HFA Inhale 1-2 puffs into the lungs every 6 (six) hours as needed for wheezing or shortness of breath. Please instruct   buPROPion  150 MG 24 hr tablet Commonly known as: WELLBUTRIN  XL Take 1 tablet by mouth once daily   carvedilol  25 MG tablet Commonly known as: COREG  TAKE 1 TABLET BY MOUTH TWICE DAILY FOR BLOOD PRESSURE   ciprofloxacin  750 MG tablet Commonly known as: CIPRO  Take 1 tablet (750 mg total) by mouth 2 (two) times daily.   cyanocobalamin  1000 MCG tablet Commonly known as: VITAMIN B12 Take 1 tablet (1,000 mcg total) by mouth daily.   FLUoxetine  40 MG capsule Commonly known as: PROZAC  Take 1 capsule (40 mg total) by mouth daily.   gabapentin  400 MG capsule Commonly known as: Neurontin  Take 1 capsule (400 mg total) by mouth 3 (three) times daily.   levothyroxine  112 MCG tablet Commonly known as: SYNTHROID  TAKE 1 TABLET BY MOUTH ONCE DAILY BEFORE BREAKFAST   spironolactone  50 MG tablet Commonly known as: ALDACTONE  Take 1 tablet (50 mg total) by mouth daily.   sulfamethoxazole -trimethoprim  800-160 MG tablet Commonly known as: BACTRIM  DS Take 1 tablet by mouth 2 (two) times daily.   Torsemide  60 MG Tabs Take 60 mg by mouth daily.   Vitamin D  (Ergocalciferol ) 1.25 MG (50000 UNIT) Caps capsule Commonly known as: DRISDOL  Take 1 capsule (50,000 Units total) by mouth every 7 (seven) days.       Verbal and written Discharge instructions given to the patient. Wound care per Discharge AVS  Follow-up Information     Schnier, Cordella MATSU, MD Follow up in 2 week(s).   Specialties: Vascular Surgery, Cardiology, Radiology, Vascular Surgery Why: Wound Check Contact information: [address] [place] [address] [phone]                 Signed: [name], NP  10/30/2023, 3:53 PM

## 2023-10-30 NOTE — TOC Transition Note (Signed)
 Transition of Care [facility]) - Discharge Note   Patient Details  Name: Hannah Orr MRN: [id number] Date of Birth: September 21, 1975  [facility] CM/SW Contact:  Corean ONEIDA Haddock, RN Phone Number: 10/30/2023, 4:29 PM   Clinical Narrative:     Patient to dc today   Shuan with [facility] notified of discharge      Patient Goals and CMS Choice            Discharge Placement                       Discharge Plan and Services Additional resources added to the After Visit Summary for                                       Social Drivers of Health (SDOH) Interventions SDOH Screenings   Food Insecurity: No Food Insecurity (10/24/2023)  Housing: Low Risk  (10/24/2023)  Transportation Needs: No Transportation Needs (10/24/2023)  Utilities: Not At Risk (10/24/2023)  Depression (PHQ2-9): Low Risk  (09/06/2023)  Social Connections: Unknown (07/10/2021)   Received from [facility]  Tobacco Use: Low Risk  (10/24/2023)     Readmission Risk Interventions     No data to display

## 2023-10-31 ENCOUNTER — Telehealth: Payer: Self-pay

## 2023-10-31 NOTE — Patient Instructions (Signed)
 Visit Information  Thank you for taking time to visit with me today. Please don't hesitate to contact me if I can be of assistance to you before our next scheduled telephone appointment.  Our next appointment is by telephone on Sept.11, 2025  at 1:00 PM  Following is a copy of your care plan:   Goals Addressed             This Visit's Progress    VBCI Transitions of Care (TOC) Care Plan       Problems:  Recent Hospitalization for treatment of Cellulitis Left leg Medication management barrier reviewed AVS with patient and she did not realize to continue antibiotics  Goal:  Over the next 30 days, the patient will not experience hospital readmission  Interventions:  Transitions of Care: Doctor Visits  - discussed the importance of doctor visits  Patient Self Care Activities:  Attend all scheduled provider appointments Call pharmacy for medication refills 3-7 days in advance of running out of medications Call provider office for new concerns or questions  Notify RN Care Manager of TOC call rescheduling needs Participate in Transition of Care Program/Attend TOC scheduled calls Take medications as prescribed    Plan:  The care management team will reach out to the patient again over the next 5 -10 business days. The patient has been provided with contact information for the care management team and has been advised to call with any health related questions or concerns.         Patient verbalizes understanding of instructions and care plan provided today and agrees to view in MyChart. Active MyChart status and patient understanding of how to access instructions and care plan via MyChart confirmed with patient.     The patient has been provided with contact information for the care management team and has been advised to call with any health related questions or concerns.  The patient will call PCP* as advised to get a hospital follow up appointment.  Follow up with provider re:  signs of worsening left leg, fever, drainage, and pain  Please call the care guide team at [phone] if you need to cancel or reschedule your appointment.   Please call the Suicide and Crisis Lifeline: 988 call the [place]  National Suicide Prevention Lifeline: (651)776-1745 or TTY: 226-173-4236 TTY 819 682 4888) to talk to a trained counselor call 1-800-273-TALK (toll free, 24 hour hotline) if you are experiencing a Mental Health or Behavioral Health Crisis or need someone to talk to.  Richerd Fish, RN, BSN, CCM [facility], [facility] RN Care Manager Direct Dial: [phone]

## 2023-10-31 NOTE — Transitions of Care (Post Inpatient/ED Visit) (Signed)
 10/31/2023  Name: Hannah Orr MRN: [id number] DOB: December 04, 1975  Today's TOC FU Call Status: Today's TOC FU Call Status:: Successful TOC FU Call Completed TOC FU Call Complete Date: 10/31/23 Patient's Name and Date of Birth confirmed.  Transition Care Management Follow-up Telephone Call Date of Discharge: 10/30/23 Discharge Facility: [facility]) Type of Discharge: Inpatient Admission Primary Inpatient Discharge Diagnosis:: Cellulitis of Right How have you been since you were released from the hospital?: Better Any questions or concerns?: No  Items Reviewed: Did you receive and understand the discharge instructions provided?: Yes Medications obtained,verified, and reconciled?: Yes (Medications Reviewed) Any new allergies since your discharge?: No Do you have support at home?: Yes People in Home [RPT]: parent(s), sibling(s) Name of Support/Comfort Primary Source: mom is with me sister Doyal  Medications Reviewed Today: Medications Reviewed Today     Reviewed by Hannah Richerd GRADE, RN (Registered Nurse) on 10/31/23 at 1449  Med List Status: <None>   Medication Order Taking? Sig Documenting Provider Last Dose Status Informant  albuterol  (VENTOLIN  HFA) 108 (90 Base) MCG/ACT inhaler [id number]  Inhale 1-2 puffs into the lungs every 6 (six) hours as needed for wheezing or shortness of breath. Please instruct  Patient not taking: Reported on 10/31/2023   Berneta Elsie Sayre, MD  Active Self  buPROPion  (WELLBUTRIN  XL) 150 MG 24 hr tablet [id number] Yes Take 1 tablet by mouth once daily Berneta Elsie Sayre, MD  Active Self  carvedilol  (COREG ) 25 MG tablet [id number] Yes TAKE 1 TABLET BY MOUTH TWICE DAILY FOR BLOOD PRESSURE Lavona Agent, MD  Active Self  ciprofloxacin  (CIPRO ) 750 MG tablet [id number]  Take 1 tablet (750 mg total) by mouth 2 (two) times daily.  Patient not taking: Reported on 10/24/2023   Brown, Fallon E, NP  Active Self  cyanocobalamin   (VITAMIN B12) 1000 MCG tablet [id number]  Take 1 tablet (1,000 mcg total) by mouth daily. Berneta Elsie Sayre, MD  Active Self  FLUoxetine  (PROZAC ) 40 MG capsule [id number] Yes Take 1 capsule (40 mg total) by mouth daily. Berneta Elsie Sayre, MD  Active Self  gabapentin  (NEURONTIN ) 400 MG capsule [id number] Yes Take 1 capsule (400 mg total) by mouth 3 (three) times daily. Berneta Elsie Sayre, MD  Active Self  levothyroxine  (SYNTHROID ) 112 MCG tablet [id number] Yes TAKE 1 TABLET BY MOUTH ONCE DAILY BEFORE BREAKFAST Berneta Elsie Sayre, MD  Active Self  spironolactone  (ALDACTONE ) 50 MG tablet [id number] Yes Take 1 tablet (50 mg total) by mouth daily. Lavona Agent, MD  Active Self  sulfamethoxazole -trimethoprim  (BACTRIM  DS) 800-160 MG tablet [id number] Yes Take 1 tablet by mouth 2 (two) times daily. Brown, Fallon E, NP  Active Self  Torsemide  60 MG TABS [id number]  Take 60 mg by mouth daily.  Patient not taking: Reported on 10/31/2023   Daneen Damien BROCKS, NP  Active Self  Vitamin D , Ergocalciferol , (DRISDOL ) 1.25 MG (50000 UNIT) CAPS capsule [id number]  Take 1 capsule (50,000 Units total) by mouth every 7 (seven) days.  Patient not taking: Reported on 10/31/2023   Berneta Elsie Sayre, MD  Active Self            Home Care and Equipment/Supplies: Were Home Health Services Ordered?: Yes Name of Home Health Agency:: [facility] Has Agency set up a time to come to your home?: Yes First Home Health Visit Date: 11/02/23 Any new equipment or medical supplies ordered?: No  Functional Questionnaire: Do you need assistance with bathing/showering or dressing?: No Do you need assistance with meal  preparation?: No Do you need assistance with eating?: No Do you have difficulty maintaining continence: No Do you need assistance with getting out of bed/getting out of a chair/moving?: No Do you have difficulty managing or taking your medications?: No  Follow up appointments reviewed: PCP Follow-up  appointment confirmed?: No (Patient was offered twice to assist with TOC follow up appointment but states she has a f/u with specialist) Specialist Hospital Follow-up appointment confirmed?: Yes Date of Specialist follow-up appointment?: 11/15/23 Follow-Up Specialty Provider:: [facility] Do you need transportation to your follow-up appointment?: No Do you understand care options if your condition(s) worsen?: Yes-patient verbalized understanding  SDOH Interventions Today    Flowsheet Row Most Recent Value  SDOH Interventions   Food Insecurity Interventions Intervention Not Indicated  Housing Interventions Intervention Not Indicated  Transportation Interventions Intervention Not Indicated  [Patient states she drives]  Utilities Interventions Intervention Not Indicated    Goals Addressed             This Visit's Progress    VBCI Transitions of Care (TOC) Care Plan       Problems:  Recent Hospitalization for treatment of Cellulitis Left leg Medication management barrier reviewed AVS with patient and she did not realize to continue antibiotics  Goal:  Over the next 30 days, the patient will not experience hospital readmission  Interventions:  Transitions of Care: Doctor Visits  - discussed the importance of doctor visits  Patient Self Care Activities:  Attend all scheduled provider appointments Call pharmacy for medication refills 3-7 days in advance of running out of medications Call provider office for new concerns or questions  Notify RN Care Manager of TOC call rescheduling needs Participate in Transition of Care Program/Attend TOC scheduled calls Take medications as prescribed    Plan:  The care management team will reach out to the patient again over the next 5 -10 business days. The patient has been provided with contact information for the care management team and has been advised to call with any health related questions or concerns.          Richerd Fish, RN, BSN, CCM [facility], [facility] RN Care Manager Direct Dial: [phone]

## 2023-11-05 ENCOUNTER — Ambulatory Visit (INDEPENDENT_AMBULATORY_CARE_PROVIDER_SITE_OTHER): Admitting: Podiatry

## 2023-11-05 DIAGNOSIS — B351 Tinea unguium: Secondary | ICD-10-CM

## 2023-11-05 DIAGNOSIS — M79675 Pain in left toe(s): Secondary | ICD-10-CM

## 2023-11-05 DIAGNOSIS — M79674 Pain in right toe(s): Secondary | ICD-10-CM | POA: Diagnosis not present

## 2023-11-05 NOTE — Progress Notes (Unsigned)
    Subjective:  Patient ID: Hannah Orr, female    DOB: 08/27/1975,  MRN: [id number]  Hannah Orr presents to clinic today for:  Chief Complaint  Patient presents with   [facility]    RFC nail trim. Would like calluses trimmed bilateral great toe medial.  Non diabetic  no anti coag   Patient notes nails are thick, discolored, elongated and painful in shoegear when trying to ambulate.  She is wearing compression Coban wrappings on both legs for edema management.  She follows up with an external facility for this.  PCP is Berneta Elsie Sayre, MD.  Past Medical History:  Diagnosis Date   Depression    Diabetes mellitus without complication (HCC)    Hypertension    Past Surgical History:  Procedure Laterality Date   MANDIBLE SURGERY Right 12/11/1993   Allergies  Allergen Reactions   Benazepril Hcl Hypertension    cough   Benazepril Hcl     cough    Review of Systems: Negative except as noted in the HPI.  Objective:  Hannah Orr is a pleasant 48 y.o. female in NAD. AAO x 3.  Vascular Examination: Capillary refill time is 3-5 seconds to toes bilateral. Palpable pedal pulses b/l LE. Digital hair present b/l.  Skin temperature gradient WNL b/l.  Edema present but cannot evaluate depth of pitting secondary to the compressive wraps on her legs.  Dermatological Examination: Pedal skin with increased turgor, texture and tone b/l. No open wounds. No interdigital macerations b/l. Toenails x10 are 3mm thick, discolored, dystrophic with subungual debris. There is pain with compression of the nail plates.  They are elongated x10  Assessment/Plan: 1. Pain due to onychomycosis of toenails of both feet     The mycotic toenails were sharply debrided x10 with sterile nail nippers and a power debriding burr to decrease bulk/thickness and length.    Return in about 3 months (around 02/04/2024) for RFC.   Awanda CHARM Imperial, DPM, FACFAS [facility]     [address] [place], [place] [address]                Office [phone]  Fax [phone]

## 2023-11-08 ENCOUNTER — Other Ambulatory Visit: Payer: Self-pay

## 2023-11-08 ENCOUNTER — Telehealth

## 2023-11-08 NOTE — Transitions of Care (Post Inpatient/ED Visit) (Signed)
   11/08/2023  Name: LEITA LINDBLOOM MRN: [id number] DOB: 1975/03/18  Today's TOC FU Call Status: Week 2     Attempted to reach the patient regarding the most recent Inpatient/ED visit.  Spoke with patient and she states she was at MD appointment and wanted a call back in 20 or so minutes.  Follow Up Plan: Additional outreach attempts will be made to reach the patient to complete the Transitions of Care (Post Inpatient/ED visit) call.   Richerd Fish, RN, BSN, CCM [facility], [facility] RN Care Manager Direct Dial: [phone]

## 2023-11-09 ENCOUNTER — Telehealth: Payer: Self-pay

## 2023-11-09 NOTE — Transitions of Care (Post Inpatient/ED Visit) (Signed)
 Care Management  Transitions of Care Program Transitions of Care Post-discharge week 2  11/09/2023 Name: Hannah Orr MRN: [id number] DOB: 1975/06/14  Subjective: Unsuccessful call attempt #2 Hannah Orr is a 48 y.o. year old female who is a primary care patient of Berneta Elsie Sayre, MD. The Care Management team was unable to reach the patient by phone to assess and address transitions of care needs. Voicemail went to a business line, unable to leave a message  Plan: Additional outreach attempts will be made to reach the patient enrolled in the [facility] Program (Post Inpatient/ED Visit).  Richerd Fish, RN, BSN, CCM [facility], [facility] RN Care Manager Direct Dial: [phone]

## 2023-11-14 NOTE — Progress Notes (Unsigned)
 MRN : [id number]  Hannah Orr is a 48 y.o. ([birth year]) female who presents with chief complaint of legs hurt and swell.  History of Present Illness:   The patient returns to the office for followup evaluation regarding leg swelling.  She was recently hospitalized and further evaluated for her out-of-control lymphedema.  Cardiac function was optimized.  Since discharge she notes the swelling has improved quite a bit and the pain associated with swelling has decreased substantially.  She states that her wraps have stayed dry and she is no longer leaking fluid onto the carpeting.  There have not been any interval development of a ulcerations or wounds.  Since discharge they have been doing 3 and 4-layer wraps as opposed to [employer].  Since the previous visit the patient has been wearing graduated compression stockings and has noted some improvement in the lymphedema. The patient has been using compression routinely morning until night.  The patient also states elevation during the day and exercise (such as walking) is being done too.    No outpatient medications have been marked as taking for the 11/15/23 encounter (Appointment) with Jama, Cordella MATSU, MD.    Past Medical History:  Diagnosis Date   Depression    Diabetes mellitus without complication (HCC)    Hypertension     Past Surgical History:  Procedure Laterality Date   MANDIBLE SURGERY Right 12/11/1993    Social History Social History   Tobacco Use   Smoking status: Never   Smokeless tobacco: Never  Vaping Use   Vaping status: Never Used  Substance Use Topics   Alcohol use: Not Currently    Comment: Stopped drinking December 2021.    Drug use: No    Family History Family History  Problem Relation Age of Onset   Hypertension Mother    Breast cancer Mother    Diabetes Mother    Hearing loss Mother    Hypertension Father    Prostate cancer Father    Cancer Father    Heart disease Neg Hx     Hyperlipidemia Neg Hx    Kidney disease Neg Hx    Early death Neg Hx    Stroke Neg Hx     Allergies  Allergen Reactions   Benazepril Hcl Hypertension    cough   Benazepril Hcl     cough     REVIEW OF SYSTEMS (Negative unless checked)  Constitutional: [] Weight loss  [] Fever  [] Chills Cardiac: [] Chest pain   [] Chest pressure   [] Palpitations   [] Shortness of breath when laying flat   [] Shortness of breath with exertion. Vascular:  [] Pain in legs with walking   [x] Pain in legs at rest  [] History of DVT   [] Phlebitis   [x] Swelling in legs   [] Varicose veins   [x] Non-healing ulcers Pulmonary:   [] Uses home oxygen   [] Productive cough   [] Hemoptysis   [] Wheeze  [] COPD   [] Asthma Neurologic:  [] Dizziness   [] Seizures   [] History of stroke   [] History of TIA  [] Aphasia   [] Vissual changes   [] Weakness or numbness in arm   [x] Weakness or numbness in leg Musculoskeletal:   [] Joint swelling   [x] Joint pain   [] Low back pain Hematologic:  [] Easy bruising  [] Easy bleeding   [] Hypercoagulable state   [] Anemic Gastrointestinal:  [] Diarrhea   [] Vomiting  [x] Gastroesophageal reflux/heartburn   [] Difficulty swallowing. Genitourinary:  [] Chronic kidney disease   [] Difficult urination  [] Frequent urination   [] Blood  in urine Skin:  [] Rashes   [] Ulcers  Psychological:  [x] History of anxiety   []  History of major depression.  Physical Examination  There were no vitals filed for this visit. There is no height or weight on file to calculate BMI. Gen: WD/WN, NAD Head: Hannah Orr/AT, No temporalis wasting.  Ear/Nose/Throat: Hearing grossly intact, nares w/o erythema or drainage, pinna without lesions Eyes: PER, EOMI, sclera nonicteric.  Neck: Supple, no gross masses.  No JVD.  Pulmonary:  Good air movement, no audible wheezing, no use of accessory muscles.  Cardiac: RRR, precordium not hyperdynamic. Vascular:  scattered varicosities present bilaterally.  Her ulcerated area is now completely epithelialized.   Severe venous stasis changes to the legs bilaterally.  2+ soft pitting edema. CEAP [id number]   Vessel Right Left  Radial Palpable Palpable  Gastrointestinal: soft, non-distended. No guarding/no peritoneal signs.  Musculoskeletal: M/S 5/5 throughout.  No deformity.  Neurologic: CN 2-12 intact. Pain and light touch intact in extremities.  Symmetrical.  Speech is fluent. Motor exam as listed above. Psychiatric: Judgment intact, Mood & affect appropriate for pt's clinical situation. Dermatologic: Venous rashes no ulcers noted.  No changes consistent with cellulitis. Lymph : No lichenification or skin changes of chronic lymphedema.  CBC Lab Results  Component Value Date   WBC 3.1 (L) 10/30/2023   HGB 10.2 (L) 10/30/2023   HCT 31.2 (L) 10/30/2023   MCV 111.8 (H) 10/30/2023   PLT 261 10/30/2023    BMET    Component Value Date/Time   NA 136 10/30/2023 0427   NA 134 03/25/2021 1412   K 4.4 10/30/2023 0427   CL 99 10/30/2023 0427   CO2 29 10/30/2023 0427   GLUCOSE 104 (H) 10/30/2023 0427   BUN 23 (H) 10/30/2023 0427   BUN 9 03/25/2021 1412   CREATININE 1.01 (H) 10/30/2023 0427   CREATININE 0.75 07/28/2020 1205   CALCIUM 9.0 10/30/2023 0427   GFRNONAA >60 10/30/2023 0427   GFRNONAA >60 07/28/2020 1205   Estimated Creatinine Clearance: 108.4 mL/min (A) (by C-G formula based on SCr of 1.01 mg/dL (H)).  COAG No results found for: INR, PROTIME  Radiology ECHOCARDIOGRAM COMPLETE Result Date: 10/25/2023    ECHOCARDIOGRAM REPORT   Patient Name:   Hannah Orr Date of Exam: 10/25/2023 Medical Rec #:  [id number]         Height:       65.0 in Accession #:    [id number]        Weight:       380.3 lb Date of Birth:  01/05/1976         BSA:          2.599 m Patient Age:    48 years          BP:           121/69 mmHg Patient Gender: F                 HR:           90 bpm. Exam Location:  [facility] Procedure: 2D Echo, Cardiac Doppler and Color Doppler (Both Spectral and Color            Flow  Doppler were utilized during procedure). Indications:     CHF--acute diastolic I50.31  History:         Patient has prior history of Echocardiogram examinations, most                  recent 05/13/2020. Risk  Factors:Hypertension and Diabetes.  Sonographer:     Christopher Furnace Referring Phys:  [id number] [facility] Diagnosing Phys: Evalene Lunger MD  Sonographer Comments: Suboptimal apical window. IMPRESSIONS  1. Left ventricular ejection fraction, by estimation, is 60 to 65%. The left ventricle has normal function. The left ventricle has no regional wall motion abnormalities. There is mild left ventricular hypertrophy. Left ventricular diastolic parameters are consistent with Grade I diastolic dysfunction (impaired relaxation).  2. Right ventricular systolic function is normal. The right ventricular size is normal.  3. The mitral valve is normal in structure. No evidence of mitral valve regurgitation. No evidence of mitral stenosis.  4. The aortic valve is normal in structure. Aortic valve regurgitation is not visualized. Aortic valve sclerosis/calcification is present, without any evidence of aortic stenosis.  5. The inferior vena cava is normal in size with greater than 50% respiratory variability, suggesting right atrial pressure of 3 mmHg. FINDINGS  Left Ventricle: Left ventricular ejection fraction, by estimation, is 60 to 65%. The left ventricle has normal function. The left ventricle has no regional wall motion abnormalities. Strain was performed and the global longitudinal strain is indeterminate. The left ventricular internal cavity size was normal in size. There is mild left ventricular hypertrophy. Left ventricular diastolic parameters are consistent with Grade I diastolic dysfunction (impaired relaxation). Right Ventricle: The right ventricular size is normal. No increase in right ventricular wall thickness. Right ventricular systolic function is normal. Left Atrium: Left atrial size was normal in size. Right  Atrium: Right atrial size was normal in size. Pericardium: There is no evidence of pericardial effusion. Mitral Valve: The mitral valve is normal in structure. Mild mitral annular calcification. No evidence of mitral valve regurgitation. No evidence of mitral valve stenosis. MV peak gradient, 4.8 mmHg. The mean mitral valve gradient is 2.0 mmHg. Tricuspid Valve: The tricuspid valve is normal in structure. Tricuspid valve regurgitation is not demonstrated. No evidence of tricuspid stenosis. Aortic Valve: The aortic valve is normal in structure. Aortic valve regurgitation is not visualized. Aortic valve sclerosis/calcification is present, without any evidence of aortic stenosis. Aortic valve mean gradient measures 6.5 mmHg. Aortic valve peak  gradient measures 11.6 mmHg. Aortic valve area, by VTI measures 2.22 cm. Pulmonic Valve: The pulmonic valve was normal in structure. Pulmonic valve regurgitation is not visualized. No evidence of pulmonic stenosis. Aorta: The aortic root is normal in size and structure. Venous: The inferior vena cava is normal in size with greater than 50% respiratory variability, suggesting right atrial pressure of 3 mmHg. IAS/Shunts: No atrial level shunt detected by color flow Doppler. Additional Comments: 3D was performed not requiring image post processing on an independent workstation and was indeterminate.  LEFT VENTRICLE PLAX 2D LVIDd:         4.30 cm   Diastology LVIDs:         3.10 cm   LV e' medial:    6.64 cm/s LV PW:         1.20 cm   LV E/e' medial:  12.2 LV IVS:        1.10 cm   LV e' lateral:   9.03 cm/s LVOT diam:     2.00 cm   LV E/e' lateral: 8.9 LV SV:         72 LV SV Index:   28 LVOT Area:     3.14 cm  RIGHT VENTRICLE RV Basal diam:  4.00 cm RV Mid diam:    4.40 cm LEFT ATRIUM  Index        RIGHT ATRIUM           Index LA diam:      3.50 cm 1.35 cm/m   RA Area:     17.90 cm LA Vol (A2C): 50.3 ml 19.35 ml/m  RA Volume:   46.60 ml  17.93 ml/m  AORTIC VALVE AV  Area (Vmax):    2.01 cm AV Area (Vmean):   1.95 cm AV Area (VTI):     2.22 cm AV Vmax:           170.00 cm/s AV Vmean:          116.500 cm/s AV VTI:            0.324 m AV Peak Grad:      11.6 mmHg AV Mean Grad:      6.5 mmHg LVOT Vmax:         109.00 cm/s LVOT Vmean:        72.300 cm/s LVOT VTI:          0.229 m LVOT/AV VTI ratio: 0.71  AORTA Ao Root diam: 3.10 cm MITRAL VALVE MV Area (PHT): 2.93 cm    SHUNTS MV Area VTI:   2.50 cm    Systemic VTI:  0.23 m MV Peak grad:  4.8 mmHg    Systemic Diam: 2.00 cm MV Mean grad:  2.0 mmHg MV Vmax:       1.09 m/s MV Vmean:      71.8 cm/s MV Decel Time: 259 msec MV E velocity: 80.70 cm/s MV A velocity: 84.40 cm/s MV E/A ratio:  0.96 Evalene Lunger MD Electronically signed by Evalene Lunger MD Signature Date/Time: 10/25/2023/4:00:20 PM    Final      Assessment/Plan 1. Venous ulcer (HCC) (Primary) Recommend:  No surgery or intervention at this point in time.  She has markedly improved and her ulcer is now healed.  The overall edema of her lower extremities is as optimal as I have ever seen it.  We will continue 4-layer wraps.  I have reviewed my discussion with the patient regarding lymphedema and why it  causes symptoms.  Patient will continue wearing graduated compression on a daily basis. The patient should put the compression on first thing in the morning and removing them in the evening. The patient should not sleep in the compression.   In addition, behavioral modification throughout the day will be continued.  This will include frequent elevation (such as in a recliner), use of over the counter pain medications as needed and exercise such as walking.  The systemic causes for chronic edema such as liver, kidney and cardiac etiologies does not appear to have significant changed over the past year.    The patient will continue aggressive use of the  lymph pump.  This will continue to improve the edema control and prevent sequela such as ulcers and  infections.   2. Venous insufficiency Recommend:  No surgery or intervention at this point in time.  She has markedly improved and her ulcer is now healed.  The overall edema of her lower extremities is as optimal as I have ever seen it.  We will continue 4-layer wraps.  I have reviewed my discussion with the patient regarding lymphedema and why it  causes symptoms.  Patient will continue wearing graduated compression on a daily basis. The patient should put the compression on first thing in the morning and removing them in the evening. The patient should  not sleep in the compression.   In addition, behavioral modification throughout the day will be continued.  This will include frequent elevation (such as in a recliner), use of over the counter pain medications as needed and exercise such as walking.  The systemic causes for chronic edema such as liver, kidney and cardiac etiologies does not appear to have significant changed over the past year.    The patient will continue aggressive use of the  lymph pump.  This will continue to improve the edema control and prevent sequela such as ulcers and infections.   3. Essential hypertension Continue antihypertensive medications as already ordered, these medications have been reviewed and there are no changes at this time.  4. Lymphedema Recommend:  No surgery or intervention at this point in time.  She has markedly improved and her ulcer is now healed.  The overall edema of her lower extremities is as optimal as I have ever seen it.  We will continue 4-layer wraps.  I have reviewed my discussion with the patient regarding lymphedema and why it  causes symptoms.  Patient will continue wearing graduated compression on a daily basis. The patient should put the compression on first thing in the morning and removing them in the evening. The patient should not sleep in the compression.   In addition, behavioral modification throughout the day will  be continued.  This will include frequent elevation (such as in a recliner), use of over the counter pain medications as needed and exercise such as walking.  The systemic causes for chronic edema such as liver, kidney and cardiac etiologies does not appear to have significant changed over the past year.    The patient will continue aggressive use of the  lymph pump.  This will continue to improve the edema control and prevent sequela such as ulcers and infections.     Cordella Shawl, MD  11/14/2023 9:39 AM

## 2023-11-15 ENCOUNTER — Ambulatory Visit (INDEPENDENT_AMBULATORY_CARE_PROVIDER_SITE_OTHER): Admitting: Vascular Surgery

## 2023-11-15 ENCOUNTER — Encounter (INDEPENDENT_AMBULATORY_CARE_PROVIDER_SITE_OTHER): Payer: Self-pay | Admitting: Vascular Surgery

## 2023-11-15 VITALS — BP 120/65 | HR 81

## 2023-11-15 DIAGNOSIS — I83009 Varicose veins of unspecified lower extremity with ulcer of unspecified site: Secondary | ICD-10-CM | POA: Diagnosis not present

## 2023-11-15 DIAGNOSIS — I872 Venous insufficiency (chronic) (peripheral): Secondary | ICD-10-CM | POA: Diagnosis not present

## 2023-11-15 DIAGNOSIS — I10 Essential (primary) hypertension: Secondary | ICD-10-CM

## 2023-11-15 DIAGNOSIS — I890 Lymphedema, not elsewhere classified: Secondary | ICD-10-CM | POA: Diagnosis not present

## 2023-11-15 DIAGNOSIS — L97909 Non-pressure chronic ulcer of unspecified part of unspecified lower leg with unspecified severity: Secondary | ICD-10-CM

## 2023-11-16 ENCOUNTER — Encounter (INDEPENDENT_AMBULATORY_CARE_PROVIDER_SITE_OTHER): Payer: Self-pay | Admitting: Vascular Surgery

## 2023-11-22 ENCOUNTER — Telehealth: Payer: Self-pay

## 2023-11-22 NOTE — Transitions of Care (Post Inpatient/ED Visit) (Signed)
  Transition of Care week 3  Visit Note  11/22/2023  Name: Hannah Orr MRN: [id number]          DOB: Apr 18, 1975  Situation: Patient enrolled in [facility] 30-day program. Visit completed with patient by telephone.   Background:   Initial Transition Care Management Follow-up Telephone Call    Past Medical History:  Diagnosis Date   Depression    Diabetes mellitus without complication (HCC)    Hypertension    Assessment:  Patient states she is working with Home Health twice a week and leg is healing well and had no additional follow up needs.  Recommendation: Closed from 30 day program. States no additional needs.  Richerd Fish, RN, BSN, CCM [facility], [facility] RN Care Manager Direct Dial: [phone]

## 2023-11-29 ENCOUNTER — Ambulatory Visit (INDEPENDENT_AMBULATORY_CARE_PROVIDER_SITE_OTHER): Admitting: Vascular Surgery

## 2023-12-04 ENCOUNTER — Ambulatory Visit (INDEPENDENT_AMBULATORY_CARE_PROVIDER_SITE_OTHER): Admitting: Nurse Practitioner

## 2023-12-04 ENCOUNTER — Encounter (INDEPENDENT_AMBULATORY_CARE_PROVIDER_SITE_OTHER): Payer: Self-pay | Admitting: Nurse Practitioner

## 2023-12-04 VITALS — BP 99/64 | HR 75 | Resp 18 | Ht 65.0 in

## 2023-12-04 DIAGNOSIS — I890 Lymphedema, not elsewhere classified: Secondary | ICD-10-CM | POA: Diagnosis not present

## 2023-12-04 DIAGNOSIS — I10 Essential (primary) hypertension: Secondary | ICD-10-CM

## 2023-12-04 NOTE — Progress Notes (Signed)
 Per Orvin Daring both legs rewrapped with 3 layer pink unna. Band-Aid and bacitracin applied to area on right great toe. Pt to follow up with home care to be rewrapped.   Dois Seip CMA

## 2023-12-04 NOTE — Progress Notes (Signed)
 Subjective:    Patient ID: Hannah Orr, female    DOB: 06-17-1975, 48 y.o.   MRN: [id number] Chief Complaint  Patient presents with   Follow-up    2 wk f/u/ pt concerns on if she is diabetic as home health has advised her of this but is not in her problem list.     The patient returns today for evaluation of her lymphedema and venous ulcer.  The wound that she had present behind her right lower extremity is essentially healed at this point in time.  She denies any additional open wounds or ulcerations.    Review of Systems  Cardiovascular:  Positive for leg swelling.  Musculoskeletal:  Positive for gait problem.  Skin:  Positive for color change.  All other systems reviewed and are negative.      Objective:   Physical Exam Vitals reviewed.  Constitutional:      Appearance: She is obese.  HENT:     Head: Normocephalic.  Cardiovascular:     Rate and Rhythm: Normal rate.  Pulmonary:     Effort: Pulmonary effort is normal.  Skin:    General: Skin is warm and dry.  Neurological:     Mental Status: She is oriented to person, place, and time.  Psychiatric:        Mood and Affect: Mood normal.        Behavior: Behavior normal.        Thought Content: Thought content normal.        Judgment: Judgment normal.     BP 99/64   Pulse 75   Resp 18   Ht 5' 5 (1.651 m)   BMI 61.07 kg/m   Past Medical History:  Diagnosis Date   Depression    Hypertension     Social History   Socioeconomic History   Marital status: Single    Spouse name: Not on file   Number of children: Not on file   Years of education: Not on file   Highest education level: Not on file  Occupational History   Not on file  Tobacco Use   Smoking status: Never   Smokeless tobacco: Never  Vaping Use   Vaping status: Never Used  Substance and Sexual Activity   Alcohol use: Not Currently    Comment: Stopped drinking December 2021.    Drug use: No   Sexual activity: Yes    Birth  control/protection: Condom  Other Topics Concern   Not on file  Social History Narrative   Lives at home with mom.   Social Drivers of Corporate investment banker Strain: Not on file  Food Insecurity: No Food Insecurity (10/31/2023)   Hunger Vital Sign    Worried About Running Out of Food in the Last Year: Never true    Ran Out of Food in the Last Year: Never true  Transportation Needs: No Transportation Needs (10/31/2023)   PRAPARE - Administrator, Civil Service (Medical): No    Lack of Transportation (Non-Medical): No  Physical Activity: Not on file  Stress: Not on file  Social Connections: Unknown (07/10/2021)   Received from [facility]   Social Network    Social Network: Not on file  Intimate Partner Violence: Not At Risk (10/31/2023)   Humiliation, Afraid, Rape, and Kick questionnaire    Fear of Current or Ex-Partner: No    Emotionally Abused: No    Physically Abused: No    Sexually Abused: No  Past Surgical History:  Procedure Laterality Date   MANDIBLE SURGERY Right 12/11/1993    Family History  Problem Relation Age of Onset   Hypertension Mother    Breast cancer Mother    Diabetes Mother    Hearing loss Mother    Hypertension Father    Prostate cancer Father    Cancer Father    Heart disease Neg Hx    Hyperlipidemia Neg Hx    Kidney disease Neg Hx    Early death Neg Hx    Stroke Neg Hx     Allergies  Allergen Reactions   Benazepril Hcl Hypertension    cough   Benazepril Hcl     cough       Latest Ref Rng & Units 10/30/2023    4:27 AM 10/26/2023    5:30 AM 10/25/2023    6:52 AM  CBC  WBC 4.0 - 10.5 K/uL 3.1  3.6  3.5   Hemoglobin 12.0 - 15.0 g/dL 89.7  89.8  9.9   Hematocrit 36.0 - 46.0 % 31.2  32.0  30.8   Platelets 150 - 400 K/uL 261  249  243       CMP     Component Value Date/Time   NA 136 10/30/2023 0427   NA 134 03/25/2021 1412   K 4.4 10/30/2023 0427   CL 99 10/30/2023 0427   CO2 29 10/30/2023 0427   GLUCOSE 104 (H)  10/30/2023 0427   BUN 23 (H) 10/30/2023 0427   BUN 9 03/25/2021 1412   CREATININE 1.01 (H) 10/30/2023 0427   CREATININE 0.75 07/28/2020 1205   CALCIUM 9.0 10/30/2023 0427   PROT 8.3 (H) 10/24/2023 1443   ALBUMIN 3.5 10/24/2023 1443   AST 22 10/24/2023 1443   AST 295 (HH) 07/28/2020 1205   ALT 12 10/24/2023 1443   ALT 120 (H) 07/28/2020 1205   ALKPHOS 48 10/24/2023 1443   BILITOT 0.8 10/24/2023 1443   BILITOT 1.0 07/28/2020 1205   GFR 90.13 05/09/2021 1152   EGFR 109 03/25/2021 1412   GFRNONAA >60 10/30/2023 0427   GFRNONAA >60 07/28/2020 1205     No results found.     Assessment & Plan:   1. Lymphedema (Primary) Had a very lengthy discussion with the patient regarding her lymphedema.  The previous wounds and weeping that she had has essentially healed.  I have discussed with the patient about the utmost importance of conservative therapy in the setting of her lymphedema.  This is something that will be required of her, likely for the rest of her life.  We discussed that there is no cure for lymphedema but rather a condition that has to be managed.  This stressed to the patient that she needs to elevate her lower extremities if she is not being active.  In addition activity helps with improvement of swelling in addition to weight loss.  We also discussed compression as well.  She currently does not have any compression socks that we will fit her and so we will maintain her Unna wraps in order not to have any development of wounds or ulcers.  However, she does not there is a wrap that they use with home health that she feels may work better for her.  I have advised her to have us  contact them to see what this is if this would beneficial her but she cannot remain in Unna boots for a lifetime.  In addition it is recommended that she really utilize her lymphedema  pump consistently.  We have placed the patient back in Unna boots until we can devise a compression solution.  Will have her return  in 4 weeks.  Regarding the patient's concern for being called diabetic.  I do not necessarily see it noted in her chart and her last A1c was in 2015.  She is advised to follow-up with her PCP for these concerns  2. Essential hypertension Continue antihypertensive medications as already ordered, these medications have been reviewed and there are no changes at this time.   Current Outpatient Medications on File Prior to Visit  Medication Sig Dispense Refill   buPROPion  (WELLBUTRIN  XL) 150 MG 24 hr tablet Take 1 tablet by mouth once daily 90 tablet 1   cyanocobalamin  (VITAMIN B12) 1000 MCG tablet Take 1 tablet (1,000 mcg total) by mouth daily. 90 tablet 5   FLUoxetine  (PROZAC ) 40 MG capsule Take 1 capsule (40 mg total) by mouth daily. 90 capsule 1   gabapentin  (NEURONTIN ) 400 MG capsule Take 1 capsule (400 mg total) by mouth 3 (three) times daily. 270 capsule 3   levothyroxine  (SYNTHROID ) 112 MCG tablet TAKE 1 TABLET BY MOUTH ONCE DAILY BEFORE BREAKFAST 90 tablet 0   spironolactone  (ALDACTONE ) 50 MG tablet Take 1 tablet (50 mg total) by mouth daily. 90 tablet 3   albuterol  (VENTOLIN  HFA) 108 (90 Base) MCG/ACT inhaler Inhale 1-2 puffs into the lungs every 6 (six) hours as needed for wheezing or shortness of breath. Please instruct (Patient not taking: Reported on 12/04/2023) 8 g 2   carvedilol  (COREG ) 25 MG tablet TAKE 1 TABLET BY MOUTH TWICE DAILY FOR BLOOD PRESSURE 180 tablet 2   ciprofloxacin  (CIPRO ) 750 MG tablet Take 1 tablet (750 mg total) by mouth 2 (two) times daily. (Patient not taking: Reported on 12/04/2023) 20 tablet 0   sulfamethoxazole -trimethoprim  (BACTRIM  DS) 800-160 MG tablet Take 1 tablet by mouth 2 (two) times daily. (Patient not taking: Reported on 12/04/2023) 20 tablet 0   Torsemide  60 MG TABS Take 60 mg by mouth daily. (Patient not taking: Reported on 12/04/2023) 90 tablet 3   Vitamin D , Ergocalciferol , (DRISDOL ) 1.25 MG (50000 UNIT) CAPS capsule Take 1 capsule (50,000 Units total)  by mouth every 7 (seven) days. (Patient not taking: Reported on 12/04/2023) 15 capsule 1   No current facility-administered medications on file prior to visit.    There are no Patient Instructions on file for this visit. No follow-ups on file.   Kambre Messner E Madalyne Husk, NP

## 2023-12-25 ENCOUNTER — Encounter (INDEPENDENT_AMBULATORY_CARE_PROVIDER_SITE_OTHER): Payer: Self-pay | Admitting: Vascular Surgery

## 2023-12-25 ENCOUNTER — Ambulatory Visit (INDEPENDENT_AMBULATORY_CARE_PROVIDER_SITE_OTHER): Admitting: Vascular Surgery

## 2023-12-25 VITALS — BP 137/79 | HR 77 | Resp 18 | Ht 66.0 in | Wt 355.0 lb

## 2023-12-25 DIAGNOSIS — I890 Lymphedema, not elsewhere classified: Secondary | ICD-10-CM

## 2023-12-25 DIAGNOSIS — K219 Gastro-esophageal reflux disease without esophagitis: Secondary | ICD-10-CM | POA: Diagnosis not present

## 2023-12-25 DIAGNOSIS — I10 Essential (primary) hypertension: Secondary | ICD-10-CM | POA: Diagnosis not present

## 2023-12-25 MED ORDER — HYDROXYZINE HCL 10 MG PO TABS: 10.0000 mg | ORAL_TABLET | Freq: Three times a day (TID) | ORAL | 0 refills | Status: AC | PRN

## 2023-12-25 NOTE — Progress Notes (Signed)
 Subjective:    Patient ID: Hannah Orr, female    DOB: Sep 11, 1975, 48 y.o.   MRN: [id number] Chief Complaint  Patient presents with   Follow-up    3 week follow up w/ no studies     The patient returns today for evaluation of her lymphedema and venous ulcer.  The wound that she had present behind her right lower extremity is essentially healed at this point in time.  She denies any additional open wounds or ulcerations.She continues to have bilateral lower extremity edema +2 on the right and +3 on the left.  It appears to be getting better but very very slowly.  She complains of severe itching that is not being covered by Benadryl  anymore.  I informed her we can try another agent such as Atarax to see if we can help with the severe itching to her lower extremities.    Review of Systems  Constitutional: Negative.   Cardiovascular:  Positive for leg swelling.  Musculoskeletal:  Positive for myalgias.  Skin:  Positive for color change.  All other systems reviewed and are negative.      Objective:   Physical Exam Constitutional:      Appearance: Normal appearance. She is obese.  HENT:     Head: Normocephalic.  Eyes:     Pupils: Pupils are equal, round, and reactive to light.  Cardiovascular:     Rate and Rhythm: Normal rate and regular rhythm.     Pulses: Normal pulses.     Heart sounds: Normal heart sounds.  Pulmonary:     Effort: Pulmonary effort is normal.     Breath sounds: Normal breath sounds.  Abdominal:     General: Abdomen is flat. Bowel sounds are normal.     Palpations: Abdomen is soft.  Musculoskeletal:        General: Swelling present.     Right lower leg: Edema present.     Left lower leg: Edema present.  Skin:    General: Skin is warm and dry.     Capillary Refill: Capillary refill takes 2 to 3 seconds.     Findings: Erythema present.  Neurological:     General: No focal deficit present.     Mental Status: She is alert and oriented to person,  place, and time. Mental status is at baseline.  Psychiatric:        Mood and Affect: Mood normal.        Behavior: Behavior normal.        Thought Content: Thought content normal.        Judgment: Judgment normal.     BP 137/79 (BP Location: Right Arm, Patient Position: Sitting, Cuff Size: Large)   Pulse 77   Resp 18   Ht 5' 6 (1.676 m)   Wt (!) 355 lb (161 kg)   BMI 57.30 kg/m   Past Medical History:  Diagnosis Date   Depression    Hypertension     Social History   Socioeconomic History   Marital status: Single    Spouse name: Not on file   Number of children: Not on file   Years of education: Not on file   Highest education level: Not on file  Occupational History   Not on file  Tobacco Use   Smoking status: Never   Smokeless tobacco: Never  Vaping Use   Vaping status: Never Used  Substance and Sexual Activity   Alcohol use: Not Currently    Comment: Stopped  drinking December 2021.    Drug use: No   Sexual activity: Yes    Birth control/protection: Condom  Other Topics Concern   Not on file  Social History Narrative   Lives at home with mom.   Social Drivers of Corporate Investment Banker Strain: Not on file  Food Insecurity: No Food Insecurity (10/31/2023)   Hunger Vital Sign    Worried About Running Out of Food in the Last Year: Never true    Ran Out of Food in the Last Year: Never true  Transportation Needs: No Transportation Needs (10/31/2023)   PRAPARE - Administrator, Civil Service (Medical): No    Lack of Transportation (Non-Medical): No  Physical Activity: Not on file  Stress: Not on file  Social Connections: Unknown (07/10/2021)   Received from [facility]   Social Network    Social Network: Not on file  Intimate Partner Violence: Not At Risk (10/31/2023)   Humiliation, Afraid, Rape, and Kick questionnaire    Fear of Current or Ex-Partner: No    Emotionally Abused: No    Physically Abused: No    Sexually Abused: No    Past  Surgical History:  Procedure Laterality Date   MANDIBLE SURGERY Right 12/11/1993    Family History  Problem Relation Age of Onset   Hypertension Mother    Breast cancer Mother    Diabetes Mother    Hearing loss Mother    Hypertension Father    Prostate cancer Father    Cancer Father    Heart disease Neg Hx    Hyperlipidemia Neg Hx    Kidney disease Neg Hx    Early death Neg Hx    Stroke Neg Hx     Allergies  Allergen Reactions   Benazepril Hcl Hypertension    cough   Benazepril Hcl     cough       Latest Ref Rng & Units 10/30/2023    4:27 AM 10/26/2023    5:30 AM 10/25/2023    6:52 AM  CBC  WBC 4.0 - 10.5 K/uL 3.1  3.6  3.5   Hemoglobin 12.0 - 15.0 g/dL 89.7  89.8  9.9   Hematocrit 36.0 - 46.0 % 31.2  32.0  30.8   Platelets 150 - 400 K/uL 261  249  243       CMP     Component Value Date/Time   NA 136 10/30/2023 0427   NA 134 03/25/2021 1412   K 4.4 10/30/2023 0427   CL 99 10/30/2023 0427   CO2 29 10/30/2023 0427   GLUCOSE 104 (H) 10/30/2023 0427   BUN 23 (H) 10/30/2023 0427   BUN 9 03/25/2021 1412   CREATININE 1.01 (H) 10/30/2023 0427   CREATININE 0.75 07/28/2020 1205   CALCIUM 9.0 10/30/2023 0427   PROT 8.3 (H) 10/24/2023 1443   ALBUMIN 3.5 10/24/2023 1443   AST 22 10/24/2023 1443   AST 295 (HH) 07/28/2020 1205   ALT 12 10/24/2023 1443   ALT 120 (H) 07/28/2020 1205   ALKPHOS 48 10/24/2023 1443   BILITOT 0.8 10/24/2023 1443   BILITOT 1.0 07/28/2020 1205   GFR 90.13 05/09/2021 1152   EGFR 109 03/25/2021 1412   GFRNONAA >60 10/30/2023 0427   GFRNONAA >60 07/28/2020 1205     No results found.     Assessment & Plan:   1. Lymphedema (Primary) Had a very lengthy discussion with the patient regarding her lymphedema.  The previous wounds and  weeping that she had has essentially healed.  I have discussed with the patient about the utmost importance of conservative therapy in the setting of her lymphedema.  This is something that will be required of  her, likely for the rest of her life.  We discussed that there is no cure for lymphedema but rather a condition that has to be managed.  This stressed to the patient that she needs to elevate her lower extremities if she is not being active.  In addition activity helps with improvement of swelling in addition to weight loss.  We also discussed compression as well.  She currently does not have any compression socks that we will fit her and so we will maintain her Unna wraps in order not to have any development of wounds or ulcers.  However, she does not feel there is a wrap that they use with home health that she feels may work better for her.  I have advised her to have us  contact them to see what this is if this would beneficial her but she cannot remain in Unna boots for a lifetime.  In addition it is recommended that she really utilize her lymphedema pump consistently.  We have placed the patient back in Unna boots until we can devise a compression solution.  Will have her return in 4 weeks.   Regarding the patient's concern for being called diabetic.  I do not necessarily see it noted in her chart and her last A1c was in 2015.  She is advised to follow-up with her PCP for these concerns  2. Essential hypertension Continue antihypertensive medications as already ordered, these medications have been reviewed and there are no changes at this time.  3. Gastroesophageal reflux disease, unspecified whether esophagitis present Continue PPI as already ordered, this medication has been reviewed and there are no changes at this time.  Avoidence of caffeine and alcohol  Moderate elevation of the head of the bed    Current Outpatient Medications on File Prior to Visit  Medication Sig Dispense Refill   albuterol  (VENTOLIN  HFA) 108 (90 Base) MCG/ACT inhaler Inhale 1-2 puffs into the lungs every 6 (six) hours as needed for wheezing or shortness of breath. Please instruct 8 g 2   buPROPion  (WELLBUTRIN  XL) 150  MG 24 hr tablet Take 1 tablet by mouth once daily 90 tablet 1   carvedilol  (COREG ) 25 MG tablet TAKE 1 TABLET BY MOUTH TWICE DAILY FOR BLOOD PRESSURE 180 tablet 2   ciprofloxacin  (CIPRO ) 750 MG tablet Take 1 tablet (750 mg total) by mouth 2 (two) times daily. 20 tablet 0   cyanocobalamin  (VITAMIN B12) 1000 MCG tablet Take 1 tablet (1,000 mcg total) by mouth daily. 90 tablet 5   FLUoxetine  (PROZAC ) 40 MG capsule Take 1 capsule (40 mg total) by mouth daily. 90 capsule 1   gabapentin  (NEURONTIN ) 400 MG capsule Take 1 capsule (400 mg total) by mouth 3 (three) times daily. 270 capsule 3   levothyroxine  (SYNTHROID ) 112 MCG tablet TAKE 1 TABLET BY MOUTH ONCE DAILY BEFORE BREAKFAST 90 tablet 0   spironolactone  (ALDACTONE ) 50 MG tablet Take 1 tablet (50 mg total) by mouth daily. 90 tablet 3   sulfamethoxazole -trimethoprim  (BACTRIM  DS) 800-160 MG tablet Take 1 tablet by mouth 2 (two) times daily. 20 tablet 0   Torsemide  60 MG TABS Take 60 mg by mouth daily. 90 tablet 3   Vitamin D , Ergocalciferol , (DRISDOL ) 1.25 MG (50000 UNIT) CAPS capsule Take 1 capsule (50,000 Units total)  by mouth every 7 (seven) days. 15 capsule 1   No current facility-administered medications on file prior to visit.    There are no Patient Instructions on file for this visit. No follow-ups on file.   [name], NP

## 2024-01-02 ENCOUNTER — Telehealth (INDEPENDENT_AMBULATORY_CARE_PROVIDER_SITE_OTHER): Payer: Self-pay

## 2024-01-02 NOTE — Telephone Encounter (Signed)
 Patient was transferred to nurse line at this time and was explaining she wasn't satisfied with how home health was wrapping her legs at this time, patient stated she liked the way the staff here wrapped her legs, but she couldn't keep coming back and forth to [facility]. I encouraged patient to speak with her home health nurse at this time in reference to her unna wraps and explain to her how they are done in our office and how high up she prefers the wraps to go. Patient stated she would contact them and let them know, I encouraged her to continue advocate for herself and if the wraps were uncomfortable when the nurse was at her residence to make the nurse aware.

## 2024-01-03 ENCOUNTER — Telehealth (INDEPENDENT_AMBULATORY_CARE_PROVIDER_SITE_OTHER): Payer: Self-pay

## 2024-01-03 NOTE — Telephone Encounter (Signed)
 Patient reach out the office stating that she was concerned with the open area that is raw and has burning sensation in the bend of the left knee. The patient states that the home health nurse is ordering medicated powder but the patient is concerned.patient stated the nurse will be coming tomorrow  to wrap her legs and she will have her contact the office.

## 2024-01-04 ENCOUNTER — Other Ambulatory Visit (INDEPENDENT_AMBULATORY_CARE_PROVIDER_SITE_OTHER): Payer: Self-pay

## 2024-01-04 MED ORDER — NYSTATIN 100000 UNIT/GM EX POWD: 1.0000 | Freq: Every day | CUTANEOUS | 0 refills | Status: DC

## 2024-01-04 NOTE — Telephone Encounter (Signed)
 Patient reach out to the office to have the home health nurse Lorinda speak with the nurse. The home health nurse notified that  between the fold of the knee there is some itching,irration, little drainage with odor. The nurse thinks it could be possible yeast infection.I spoke with Orvin NP and she recommended for nystatin powder to sent to the pharmacy and used daily. Patient and home health verbalized understanding.

## 2024-01-09 ENCOUNTER — Telehealth (INDEPENDENT_AMBULATORY_CARE_PROVIDER_SITE_OTHER): Payer: Self-pay

## 2024-01-09 NOTE — Telephone Encounter (Signed)
 Brittany with [facility] called stating that patients bilateral wounds are healed at this time and is requesting that the unna wraps be discontinued and patient begin compression. She would like parameters on type/style of compression to advise patient. Please advise

## 2024-01-14 NOTE — Telephone Encounter (Signed)
 She can use knee high compression 20-30mmHG.  These can be toe in or toe out.  They can be zippered on non zippered.  Additionally she an use Hannah Orr (sometimes called compression wraps)

## 2024-01-14 NOTE — Telephone Encounter (Signed)
 Call to [facility] and delivered message below, no further questions at this time.

## 2024-01-21 ENCOUNTER — Other Ambulatory Visit: Payer: Self-pay | Admitting: Family Medicine

## 2024-01-21 DIAGNOSIS — E039 Hypothyroidism, unspecified: Secondary | ICD-10-CM

## 2024-01-28 ENCOUNTER — Ambulatory Visit (INDEPENDENT_AMBULATORY_CARE_PROVIDER_SITE_OTHER): Admitting: Vascular Surgery

## 2024-01-28 ENCOUNTER — Encounter (INDEPENDENT_AMBULATORY_CARE_PROVIDER_SITE_OTHER): Payer: Self-pay | Admitting: Vascular Surgery

## 2024-01-28 VITALS — BP 145/75 | HR 80 | Resp 18 | Wt 323.0 lb

## 2024-01-28 DIAGNOSIS — I890 Lymphedema, not elsewhere classified: Secondary | ICD-10-CM

## 2024-01-28 DIAGNOSIS — I83009 Varicose veins of unspecified lower extremity with ulcer of unspecified site: Secondary | ICD-10-CM | POA: Diagnosis not present

## 2024-01-28 DIAGNOSIS — I872 Venous insufficiency (chronic) (peripheral): Secondary | ICD-10-CM

## 2024-01-28 DIAGNOSIS — I10 Essential (primary) hypertension: Secondary | ICD-10-CM | POA: Diagnosis not present

## 2024-01-28 DIAGNOSIS — K219 Gastro-esophageal reflux disease without esophagitis: Secondary | ICD-10-CM

## 2024-01-28 DIAGNOSIS — L97909 Non-pressure chronic ulcer of unspecified part of unspecified lower leg with unspecified severity: Secondary | ICD-10-CM

## 2024-01-28 NOTE — Progress Notes (Unsigned)
 MRN : [id number]  Hannah Orr is a 48 y.o. (1975-06-06) female who presents with chief complaint of legs swell.  History of Present Illness:  The patient returns to the office for followup evaluation regarding leg swelling.  The swelling has improved quite a bit and the pain associated with swelling has decreased substantially.  Her previous wounds are now completely healed.  There have not been any interval development of a ulcerations or wounds.  She has been continuing her Unna wraps but has been able to obtain compression wraps which are to be delivered this Friday.  She will work with visiting nurses to learn how to apply them.  Since the previous visit the patient has been in [employer] and has noted some improvement in the lymphedema. The patient has been using compression routinely morning until night.  The patient also states elevation during the day and exercise (such as walking) is being done too.    No outpatient medications have been marked as taking for the 01/28/24 encounter (Appointment) with Jama, Cordella MATSU, MD.    Past Medical History:  Diagnosis Date   Depression    Hypertension     Past Surgical History:  Procedure Laterality Date   MANDIBLE SURGERY Right 12/11/1993    Social History Social History   Tobacco Use   Smoking status: Never   Smokeless tobacco: Never  Vaping Use   Vaping status: Never Used  Substance Use Topics   Alcohol use: Not Currently    Comment: Stopped drinking December 2021.    Drug use: No    Family History Family History  Problem Relation Age of Onset   Hypertension Mother    Breast cancer Mother    Diabetes Mother    Hearing loss Mother    Hypertension Father    Prostate cancer Father    Cancer Father    Heart disease Neg Hx    Hyperlipidemia Neg Hx    Kidney disease Neg Hx    Early death Neg Hx    Stroke Neg Hx     Allergies  Allergen Reactions   Benazepril Hcl Hypertension     cough   Benazepril Hcl     cough     REVIEW OF SYSTEMS (Negative unless checked)  Constitutional: [] Weight loss  [] Fever  [] Chills Cardiac: [] Chest pain   [] Chest pressure   [] Palpitations   [] Shortness of breath when laying flat   [] Shortness of breath with exertion. Vascular:  [] Pain in legs with walking   [x] Pain in legs with standing  [] History of DVT   [] Phlebitis   [x] Swelling in legs   [] Varicose veins   [] Non-healing ulcers Pulmonary:   [] Uses home oxygen   [] Productive cough   [] Hemoptysis   [] Wheeze  [] COPD   [] Asthma Neurologic:  [] Dizziness   [] Seizures   [] History of stroke   [] History of TIA  [] Aphasia   [] Vissual changes   [] Weakness or numbness in arm   [] Weakness or numbness in leg Musculoskeletal:   [] Joint swelling   [x] Joint pain   [] Low back pain Hematologic:  [] Easy bruising  [] Easy bleeding   [] Hypercoagulable state   [] Anemic Gastrointestinal:  [] Diarrhea   [] Vomiting  [x] Gastroesophageal reflux/heartburn   [] Difficulty swallowing. Genitourinary:  [] Chronic kidney disease   [] Difficult urination  [] Frequent urination   [] Blood in urine  Skin:  [] Rashes   [] Ulcers  Psychological:  [] History of anxiety   [x]  History of major depression.  Physical Examination  There were no vitals filed for this visit. There is no height or weight on file to calculate BMI. Gen: WD/WN, NAD Head: [place]/AT, No temporalis wasting.  Ear/Nose/Throat: Hearing grossly intact, nares w/o erythema or drainage, pinna without lesions Eyes: PER, EOMI, sclera nonicteric.  Neck: Supple, no gross masses.  No JVD.  Pulmonary:  Good air movement, no audible wheezing, no use of accessory muscles.  Cardiac: RRR, precordium not hyperdynamic. Vascular:  scattered varicosities present bilaterally.  Mild venous stasis changes to the legs bilaterally.  3+ soft pitting edema, CEAP [id number]  Vessel Right Left  Radial Palpable Palpable  Gastrointestinal: soft, non-distended. No guarding/no peritoneal  signs.  Musculoskeletal: M/S 5/5 throughout.  No deformity.  Neurologic: CN 2-12 intact. Pain and light touch intact in extremities.  Symmetrical.  Speech is fluent. Motor exam as listed above. Psychiatric: Judgment intact, Mood & affect appropriate for pt's clinical situation. Dermatologic: Venous rashes; no ulcers noted.  No changes consistent with cellulitis. Lymph : No lichenification or skin changes of chronic lymphedema.  CBC Lab Results  Component Value Date   WBC 3.1 (L) 10/30/2023   HGB 10.2 (L) 10/30/2023   HCT 31.2 (L) 10/30/2023   MCV 111.8 (H) 10/30/2023   PLT 261 10/30/2023    BMET    Component Value Date/Time   NA 136 10/30/2023 0427   NA 134 03/25/2021 1412   K 4.4 10/30/2023 0427   CL 99 10/30/2023 0427   CO2 29 10/30/2023 0427   GLUCOSE 104 (H) 10/30/2023 0427   BUN 23 (H) 10/30/2023 0427   BUN 9 03/25/2021 1412   CREATININE 1.01 (H) 10/30/2023 0427   CREATININE 0.75 07/28/2020 1205   CALCIUM 9.0 10/30/2023 0427   GFRNONAA >60 10/30/2023 0427   GFRNONAA >60 07/28/2020 1205   CrCl cannot be calculated (Patient's most recent lab result is older than the maximum 21 days allowed.).  COAG No results found for: INR, PROTIME  Radiology No results found.   Assessment/Plan 1. Venous insufficiency (Primary) Recommend:  No surgery or intervention at this point in time.    I have reviewed my discussion with the patient regarding lymphedema and why it  causes symptoms.  Patient will continue wearing graduated compression on a daily basis. The patient should put the compression on first thing in the morning and removing them in the evening. The patient should not sleep in the compression.   In addition, behavioral modification throughout the day will be continued.  This will include frequent elevation (such as in a recliner), use of over the counter pain medications as needed and exercise such as walking.  The systemic causes for chronic edema such as  liver, kidney and cardiac etiologies does not appear to have significant changed over the past year.    The patient will continue aggressive use of the  lymph pump.  This will continue to improve the edema control and prevent sequela such as ulcers and infections.   The patient will follow-up with me on an annual basis.   2. Venous ulcer (HCC) The venous ulcer is completely healed.  3. Lymphedema Recommend:  No surgery or intervention at this point in time.    I have reviewed my discussion with the patient regarding lymphedema and why it  causes symptoms.  Patient will continue wearing graduated compression on a daily basis. The patient should put  the compression on first thing in the morning and removing them in the evening. The patient should not sleep in the compression.   In addition, behavioral modification throughout the day will be continued.  This will include frequent elevation (such as in a recliner), use of over the counter pain medications as needed and exercise such as walking.  The systemic causes for chronic edema such as liver, kidney and cardiac etiologies does not appear to have significant changed over the past year.    The patient will continue aggressive use of the  lymph pump.  This will continue to improve the edema control and prevent sequela such as ulcers and infections.   The patient will follow-up with me on an annual basis.   4. Essential hypertension Continue antihypertensive medications as already ordered, these medications have been reviewed and there are no changes at this time.  5. Gastroesophageal reflux disease, unspecified whether esophagitis present Continue PPI as already ordered, this medication has been reviewed and there are no changes at this time.  Avoidence of caffeine and alcohol  Moderate elevation of the head of the bed     Cordella Shawl, MD  01/28/2024 12:41 PM

## 2024-01-29 ENCOUNTER — Encounter (INDEPENDENT_AMBULATORY_CARE_PROVIDER_SITE_OTHER): Payer: Self-pay | Admitting: Vascular Surgery

## 2024-02-02 ENCOUNTER — Other Ambulatory Visit (INDEPENDENT_AMBULATORY_CARE_PROVIDER_SITE_OTHER): Payer: Self-pay | Admitting: Nurse Practitioner

## 2024-02-04 ENCOUNTER — Ambulatory Visit: Admitting: Podiatry

## 2024-02-04 ENCOUNTER — Encounter: Payer: Self-pay | Admitting: Podiatry

## 2024-02-04 DIAGNOSIS — B351 Tinea unguium: Secondary | ICD-10-CM

## 2024-02-04 DIAGNOSIS — M79674 Pain in right toe(s): Secondary | ICD-10-CM

## 2024-02-04 DIAGNOSIS — I890 Lymphedema, not elsewhere classified: Secondary | ICD-10-CM

## 2024-02-04 DIAGNOSIS — M79675 Pain in left toe(s): Secondary | ICD-10-CM

## 2024-02-04 NOTE — Progress Notes (Signed)
       Subjective:  Patient ID: Lovett JULIANNA Hales, female    DOB: [birth year],  MRN: [id number]  Lovett JULIANNA Hales presents to clinic today for:  Chief Complaint  Patient presents with   [facility]     RFC Non diabetic toenail trim. Thick toenails.    Patient notes nails are thick and elongated, causing pain in shoe gear when ambulating.  She is requesting an order for lymphedema compression stockings.  States her current socks are not doing the job.  She is wondering what she can do for her dry cuticles.  PCP is Berneta Elsie Sayre, MD.  Past Medical History:  Diagnosis Date   Depression    Hypertension    Allergies  Allergen Reactions   Benazepril Hcl Hypertension    cough   Benazepril Hcl     cough    Objective:  ELYNA PANGILINAN is a pleasant 48 y.o. female in NAD. AAO x 3.  Vascular Examination: Patient has palpable DP pulse, absent PT pulse bilateral.  Delayed capillary refill bilateral toes.  Sparse digital hair bilateral.  Proximal to distal cooling WNL bilateral.    Dermatological Examination: Interspaces are clear with no open lesions noted bilateral.  Skin is shiny and atrophic bilateral.  Nails are 3-69mm thick, with yellowish/brown discoloration, subungual debris and distal onycholysis x10.  There is pain with compression of nails x10.  The cuticle/eponychium area of the bilateral hallux is very dry and thick.  It was able to be removed with minimal bleeding.  Hemostasis was obtained.  Lumicain was utilized and a Band-Aid was applied  Assessment/Plan: 1. Pain due to onychomycosis of toenails of both feet   2. Lymphedema    COMPRESSION STOCKINGS -patient requested a prescription for knee-high compression stockings.  She states her current ones are not working well for her.  These were prescribed at the lowest compression, as I am not sure she will tolerate anything tighter.  She already wears the lymphedema Ace wraps with the Velcro closure on her legs.  She most likely  would be better served with the liner that typically comes with these, but we will go ahead and give the prescription for the compression stockings that she can have filled at any durable medical equipment store.  Mycotic nails x10 were sharply debrided with sterile nail nippers and power debriding burr to decrease bulk and length.  Return in about 3 months (around 05/04/2024) for RFC.   Awanda CHARM Imperial, DPM, FACFAS [facility]     [address] [place], [place] [address]                Office [phone]  Fax [phone]

## 2024-02-08 ENCOUNTER — Ambulatory Visit

## 2024-02-08 DIAGNOSIS — Z1211 Encounter for screening for malignant neoplasm of colon: Secondary | ICD-10-CM

## 2024-02-08 DIAGNOSIS — Z0000 Encounter for general adult medical examination without abnormal findings: Secondary | ICD-10-CM | POA: Diagnosis not present

## 2024-02-08 NOTE — Progress Notes (Signed)
 Chief Complaint  Patient presents with   Medicare Wellness     Subjective:   Hannah Orr is a 48 y.o. female who presents for a Medicare Annual Wellness Visit.  Visit info / Clinical Intake: Medicare Wellness Visit Type:: Initial Annual Wellness Visit Persons participating in visit and providing information:: patient Medicare Wellness Visit Mode:: Video Since this visit was completed virtually, some vitals may be partially provided or unavailable. Missing vitals are due to the limitations of the virtual format.: Documented vitals are patient reported If Telephone or Video please confirm:: I connected with patient using audio/video enable telemedicine. I verified patient identity with two identifiers, discussed telehealth limitations, and patient agreed to proceed. Patient Location:: home Provider Location:: office Interpreter Needed?: No Pre-visit prep was completed: yes AWV questionnaire completed by patient prior to visit?: no Living arrangements:: with family/others Patient's Overall Health Status Rating: very good Typical amount of pain: some Does pain affect daily life?: (!) yes Are you currently prescribed opioids?: no  Dietary Habits and Nutritional Risks How many meals a day?: 2 Eats fruit and vegetables daily?: yes Most meals are obtained by: preparing own meals In the last 2 weeks, have you had any of the following?: none Diabetic:: no  Functional Status Activities of Daily Living (to include ambulation/medication): Independent Ambulation: Independent Home Assistive Devices/Equipment: Cane Medication Administration: Independent Home Management (perform basic housework or laundry): Independent Manage your own finances?: yes Primary transportation is: driving Concerns about vision?: no *vision screening is required for WTM* Concerns about hearing?: no  Fall Screening Falls in the past year?: 0 Number of falls in past year: 0 Was there an injury with  Fall?: 0 Fall Risk Category Calculator: 0 Patient Fall Risk Level: Low Fall Risk  Fall Risk Patient at Risk for Falls Due to: Medication side effect Fall risk Follow up: Falls evaluation completed; Falls prevention discussed  Home and Transportation Safety: All rugs have non-skid backing?: yes All stairs or steps have railings?: yes Grab bars in the bathtub or shower?: yes Have non-skid surface in bathtub or shower?: (!) no Good home lighting?: yes Regular seat belt use?: yes Hospital stays in the last year:: (!) yes How many hospital stays:: 1 Reason: cellulitis leg  Cognitive Assessment Difficulty concentrating, remembering, or making decisions? : no Will 6CIT or Mini Cog be Completed: yes What year is it?: 0 points What month is it?: 0 points Give patient an address phrase to remember (5 components): [address] About what time is it?: 0 points Count backwards from 20 to 1: 0 points Say the months of the year in reverse: 0 points Repeat the address phrase from earlier: 0 points 6 CIT Score: 0 points  Advance Directives (For Healthcare) Does Patient Have a Medical Advance Directive?: No Would patient like information on creating a medical advance directive?: No - Patient declined  Reviewed/Updated  Reviewed/Updated: Reviewed All (Medical, Surgical, Family, Medications, Allergies, Care Teams, Patient Goals)    Allergies (verified) Benazepril hcl and Benazepril hcl   Current Medications (verified) Outpatient Encounter Medications as of 02/08/2024  Medication Sig   albuterol  (VENTOLIN  HFA) 108 (90 Base) MCG/ACT inhaler Inhale 1-2 puffs into the lungs every 6 (six) hours as needed for wheezing or shortness of breath. Please instruct   buPROPion  (WELLBUTRIN  XL) 150 MG 24 hr tablet Take 1 tablet by mouth once daily   carvedilol  (COREG ) 25 MG tablet TAKE 1 TABLET BY MOUTH TWICE DAILY FOR BLOOD PRESSURE   cyanocobalamin  (VITAMIN  B12) 1000 MCG tablet Take 1 tablet  (1,000 mcg total) by mouth daily.   FLUoxetine  (PROZAC ) 40 MG capsule Take 1 capsule (40 mg total) by mouth daily.   gabapentin  (NEURONTIN ) 400 MG capsule Take 1 capsule (400 mg total) by mouth 3 (three) times daily.   hydrOXYzine  (ATARAX ) 10 MG tablet Take 1 tablet (10 mg total) by mouth 3 (three) times daily as needed.   levothyroxine  (SYNTHROID ) 112 MCG tablet TAKE 1 TABLET BY MOUTH ONCE DAILY BEFORE BREAKFAST   nystatin  (MYCOSTATIN /NYSTOP ) powder APPLY 1 APPLICATION TOPICALLY DAILY   spironolactone  (ALDACTONE ) 50 MG tablet Take 1 tablet (50 mg total) by mouth daily.   Torsemide  60 MG TABS Take 60 mg by mouth daily.   Vitamin D , Ergocalciferol , (DRISDOL ) 1.25 MG (50000 UNIT) CAPS capsule Take 1 capsule (50,000 Units total) by mouth every 7 (seven) days.   ciprofloxacin  (CIPRO ) 750 MG tablet Take 1 tablet (750 mg total) by mouth 2 (two) times daily. (Patient not taking: Reported on 02/04/2024)   sulfamethoxazole -trimethoprim  (BACTRIM  DS) 800-160 MG tablet Take 1 tablet by mouth 2 (two) times daily. (Patient not taking: Reported on 02/04/2024)   No facility-administered encounter medications on file as of 02/08/2024.    History: Past Medical History:  Diagnosis Date   Depression    Hypertension    Past Surgical History:  Procedure Laterality Date   MANDIBLE SURGERY Right 12/11/1993   Family History  Problem Relation Age of Onset   Hypertension Mother    Breast cancer Mother    Diabetes Mother    Hearing loss Mother    Hypertension Father    Prostate cancer Father    Cancer Father    Heart disease Neg Hx    Hyperlipidemia Neg Hx    Kidney disease Neg Hx    Early death Neg Hx    Stroke Neg Hx    Social History   Occupational History   Not on file  Tobacco Use   Smoking status: Never   Smokeless tobacco: Never  Vaping Use   Vaping status: Never Used  Substance and Sexual Activity   Alcohol use: Not Currently    Comment: Stopped drinking December 2021.    Drug use: No    Sexual activity: Yes    Birth control/protection: Condom   Tobacco Counseling Counseling given: Not Answered  SDOH Screenings   Food Insecurity: No Food Insecurity (02/08/2024)  Housing: Unknown (02/08/2024)  Transportation Needs: No Transportation Needs (02/08/2024)  Utilities: Not At Risk (02/08/2024)  Alcohol Screen: Low Risk (02/08/2024)  Depression (PHQ2-9): Low Risk (02/08/2024)  Financial Resource Strain: Low Risk (02/08/2024)  Physical Activity: Insufficiently Active (02/08/2024)  Social Connections: Moderately Isolated (02/08/2024)  Stress: No Stress Concern Present (02/08/2024)  Tobacco Use: Low Risk (02/08/2024)  Health Literacy: Adequate Health Literacy (02/08/2024)   See flowsheets for full screening details  Depression Screen PHQ 2 & 9 Depression Scale- Over the past 2 weeks, how often have you been bothered by any of the following problems? Little interest or pleasure in doing things: 0 Feeling down, depressed, or hopeless (PHQ Adolescent also includes...irritable): 0 PHQ-2 Total Score: 0 Trouble falling or staying asleep, or sleeping too much: 0 Feeling tired or having little energy: 0 Poor appetite or overeating (PHQ Adolescent also includes...weight loss): 0 Feeling bad about yourself - or that you are a failure or have let yourself or your family down: 0 Trouble concentrating on things, such as reading the newspaper or watching television (PHQ Adolescent also includes...like school  work): 0 Moving or speaking so slowly that other people could have noticed. Or the opposite - being so fidgety or restless that you have been moving around a lot more than usual: 0 Thoughts that you would be better off dead, or of hurting yourself in some way: 0 PHQ-9 Total Score: 0 If you checked off any problems, how difficult have these problems made it for you to do your work, take care of things at home, or get along with other people?: Not difficult at all  Depression  Treatment Depression Interventions/Treatment : EYV7-0 Score <4 Follow-up Not Indicated     Goals Addressed             This Visit's Progress    Patient Stated       02/08/2024, wants to lose weight             Objective:    Today's Vitals   There is no height or weight on file to calculate BMI.  Hearing/Vision screen Hearing Screening - Comments:: Denies hearing issues Vision Screening - Comments:: Regular eye exams, Dr. Oman Immunizations and Health Maintenance Health Maintenance  Topic Date Due   Hepatitis B Vaccines 19-59 Average Risk (1 of 3 - 19+ 3-dose series) Never done   Cervical Cancer Screening (HPV/Pap Cotest)  08/23/2013   Colonoscopy  Never done   Influenza Vaccine  09/28/2023   Medicare Annual Wellness (AWV)  02/07/2025   Mammogram  06/17/2025   DTaP/Tdap/Td (3 - Td or Tdap) 09/23/2028   Pneumococcal Vaccine  Completed   Hepatitis C Screening  Completed   HIV Screening  Completed   HPV VACCINES  Aged Out   Meningococcal B Vaccine  Aged Out   COVID-19 Vaccine  Discontinued        Assessment/Plan:  This is a routine wellness examination for Rashunda.  Patient Care Team: Berneta Elsie Sayre, MD as PCP - General (Family Medicine) Lynwood Schilling, MD as PCP - Cardiology (Cardiology) Jama, Cordella MATSU, MD (Vascular Surgery) Oman, Heather, OD [facility])  I have personally reviewed and noted the following in the patients chart:   Medical and social history Use of alcohol, tobacco or illicit drugs  Current medications and supplements including opioid prescriptions. Functional ability and status Nutritional status Physical activity Advanced directives List of other physicians Hospitalizations, surgeries, and ER visits in previous 12 months Vitals Screenings to include cognitive, depression, and falls Referrals and appointments  Orders Placed This Encounter  Procedures   Ambulatory referral to Gastroenterology    Referral Priority:    Routine    Referral Type:   Consultation    Referral Reason:   Specialty Services Required    Number of Visits Requested:   1   In addition, I have reviewed and discussed with patient certain preventive protocols, quality metrics, and best practice recommendations. A written personalized care plan for preventive services as well as general preventive health recommendations were provided to patient.   Ardella FORBES Dawn, LPN   [id number]/[id number]   Return in 1 year (on 02/07/2025).  After Visit Summary: (MyChart) Due to this being a telephonic visit, the after visit summary with patients personalized plan was offered to patient via MyChart   Nurse Notes: Vaccines not given: Will obtain flu at next visit HM Addressed: Referral sent to GI for colonoscopy Patient states she will follow up with gynecology for her pap smear.

## 2024-02-08 NOTE — Patient Instructions (Signed)
 Hannah Orr,  Thank you for taking the time for your Medicare Wellness Visit. I appreciate your continued commitment to your health goals. Please review the care plan we discussed, and feel free to reach out if I can assist you further.  Please note that Annual Wellness Visits do not include a physical exam. Some assessments may be limited, especially if the visit was conducted virtually. If needed, we may recommend an in-person follow-up with your provider.  Ongoing Care Seeing your primary care provider every 3 to 6 months helps us  monitor your health and provide consistent, personalized care.   Referrals If a referral was made during today's visit and you haven't received any updates within two weeks, please contact the referred provider directly to check on the status.  Recommended Screenings:  Health Maintenance  Topic Date Due   Medicare Annual Wellness Visit  Never done   Hepatitis B Vaccine (1 of 3 - 19+ 3-dose series) Never done   Pap with HPV screening  08/23/2013   Colon Cancer Screening  Never done   Flu Shot  09/28/2023   Breast Cancer Screening  06/17/2025   DTaP/Tdap/Td vaccine (3 - Td or Tdap) 09/23/2028   Pneumococcal Vaccine  Completed   Hepatitis C Screening  Completed   HIV Screening  Completed   HPV Vaccine  Aged Out   Meningitis B Vaccine  Aged Out   COVID-19 Vaccine  Discontinued       02/08/2024    2:18 PM  Advanced Directives  Does Patient Have a Medical Advance Directive? No  Would patient like information on creating a medical advance directive? No - Patient declined    Vision: Annual vision screenings are recommended for early detection of glaucoma, cataracts, and diabetic retinopathy. These exams can also reveal signs of chronic conditions such as diabetes and high blood pressure.  Dental: Annual dental screenings help detect early signs of oral cancer, gum disease, and other conditions linked to overall health, including heart disease and  diabetes.  Please see the attached documents for additional preventive care recommendations.

## 2024-02-14 ENCOUNTER — Other Ambulatory Visit (INDEPENDENT_AMBULATORY_CARE_PROVIDER_SITE_OTHER): Payer: Self-pay | Admitting: Nurse Practitioner

## 2024-03-08 NOTE — Progress Notes (Unsigned)
 "        MRN : [id number]  Hannah Orr is a 49 y.o. (1975/06/13) female who presents with chief complaint of legs hurt and swell.  History of Present Illness:  The patient returns to the office for followup evaluation regarding leg swelling.  The swelling has improved quite a bit and the pain associated with swelling has decreased substantially.  Her previous wounds are now completely healed.  There have not been any interval development of a ulcerations or wounds.  She has been continuing her Unna wraps but has been able to obtain compression wraps which are to be delivered this Friday.  She will work with visiting nurses to learn how to apply them.   Since the previous visit the patient has been in [employer] and has noted some improvement in the lymphedema. The patient has been using compression routinely morning until night.   The patient also states elevation during the day and exercise (such as walking) is being done too.     Active Medications[1]  Past Medical History:  Diagnosis Date   Depression    Hypertension     Past Surgical History:  Procedure Laterality Date   MANDIBLE SURGERY Right 12/11/1993    Social History Social History[2]  Family History Family History  Problem Relation Age of Onset   Hypertension Mother    Breast cancer Mother    Diabetes Mother    Hearing loss Mother    Hypertension Father    Prostate cancer Father    Cancer Father    Heart disease Neg Hx    Hyperlipidemia Neg Hx    Kidney disease Neg Hx    Early death Neg Hx    Stroke Neg Hx     Allergies[3]   REVIEW OF SYSTEMS (Negative unless checked)  Constitutional: [] Weight loss  [] Fever  [] Chills Cardiac: [] Chest pain   [] Chest pressure   [] Palpitations   [] Shortness of breath when laying flat   [] Shortness of breath with exertion. Vascular:  [] Pain in legs with walking   [x] Pain in legs at rest  [] History of DVT   [] Phlebitis   [x] Swelling in legs   [] Varicose veins    [] Non-healing ulcers Pulmonary:   [] Uses home oxygen   [] Productive cough   [] Hemoptysis   [] Wheeze  [] COPD   [] Asthma Neurologic:  [] Dizziness   [] Seizures   [] History of stroke   [] History of TIA  [] Aphasia   [] Vissual changes   [] Weakness or numbness in arm   [] Weakness or numbness in leg Musculoskeletal:   [] Joint swelling   [] Joint pain   [] Low back pain Hematologic:  [] Easy bruising  [] Easy bleeding   [] Hypercoagulable state   [] Anemic Gastrointestinal:  [] Diarrhea   [] Vomiting  [] Gastroesophageal reflux/heartburn   [] Difficulty swallowing. Genitourinary:  [] Chronic kidney disease   [] Difficult urination  [] Frequent urination   [] Blood in urine Skin:  [] Rashes   [] Ulcers  Psychological:  [] History of anxiety   []  History of major depression.  Physical Examination  There were no vitals filed for this visit. There is no height or weight on file to calculate BMI. Gen: WD/WN, NAD Head: [place]/AT, No temporalis wasting.  Ear/Nose/Throat: Hearing grossly intact, nares w/o erythema or drainage, pinna without lesions Eyes: PER, EOMI, sclera nonicteric.  Neck: Supple, no gross masses.  No JVD.  Pulmonary:  Good air movement, no audible wheezing, no use of accessory muscles.  Cardiac: RRR, precordium not hyperdynamic. Vascular:  scattered varicosities present bilaterally.  Moderate venous  stasis changes to the legs bilaterally.  2+ soft pitting edema. CEAP [id number]   Vessel Right Left  Radial Palpable Palpable  Gastrointestinal: soft, non-distended. No guarding/no peritoneal signs.  Musculoskeletal: M/S 5/5 throughout.  No deformity.  Neurologic: CN 2-12 intact. Pain and light touch intact in extremities.  Symmetrical.  Speech is fluent. Motor exam as listed above. Psychiatric: Judgment intact, Mood & affect appropriate for pt's clinical situation. Dermatologic: Venous rashes no ulcers noted.  No changes consistent with cellulitis. Lymph : No lichenification or skin changes of chronic  lymphedema.  CBC Lab Results  Component Value Date   WBC 3.1 (L) 10/30/2023   HGB 10.2 (L) 10/30/2023   HCT 31.2 (L) 10/30/2023   MCV 111.8 (H) 10/30/2023   PLT 261 10/30/2023    BMET    Component Value Date/Time   NA 136 10/30/2023 0427   NA 134 03/25/2021 1412   K 4.4 10/30/2023 0427   CL 99 10/30/2023 0427   CO2 29 10/30/2023 0427   GLUCOSE 104 (H) 10/30/2023 0427   BUN 23 (H) 10/30/2023 0427   BUN 9 03/25/2021 1412   CREATININE 1.01 (H) 10/30/2023 0427   CREATININE 0.75 07/28/2020 1205   CALCIUM 9.0 10/30/2023 0427   GFRNONAA >60 10/30/2023 0427   GFRNONAA >60 07/28/2020 1205   CrCl cannot be calculated (Patient's most recent lab result is older than the maximum 21 days allowed.).  COAG No results found for: INR, PROTIME  Radiology No results found.   Assessment/Plan There are no diagnoses linked to this encounter.   Cordella Shawl, MD  03/08/2024 2:57 PM      [1]  No outpatient medications have been marked as taking for the 03/10/24 encounter (Appointment) with Shawl, Cordella MATSU, MD.  [2]  Social History Tobacco Use   Smoking status: Never   Smokeless tobacco: Never  Vaping Use   Vaping status: Never Used  Substance Use Topics   Alcohol use: Not Currently    Comment: Stopped drinking December 2021.    Drug use: No  [3]  Allergies Allergen Reactions   Benazepril Hcl Hypertension    cough   Benazepril Hcl     cough   "

## 2024-03-10 ENCOUNTER — Ambulatory Visit (INDEPENDENT_AMBULATORY_CARE_PROVIDER_SITE_OTHER): Admitting: Vascular Surgery

## 2024-03-10 ENCOUNTER — Encounter (INDEPENDENT_AMBULATORY_CARE_PROVIDER_SITE_OTHER): Payer: Self-pay | Admitting: Vascular Surgery

## 2024-03-10 ENCOUNTER — Ambulatory Visit: Admitting: Family Medicine

## 2024-03-10 VITALS — BP 128/75 | HR 89 | Resp 17 | Ht 66.0 in

## 2024-03-10 DIAGNOSIS — I872 Venous insufficiency (chronic) (peripheral): Secondary | ICD-10-CM | POA: Diagnosis not present

## 2024-03-10 DIAGNOSIS — I890 Lymphedema, not elsewhere classified: Secondary | ICD-10-CM | POA: Diagnosis not present

## 2024-03-10 DIAGNOSIS — L97909 Non-pressure chronic ulcer of unspecified part of unspecified lower leg with unspecified severity: Secondary | ICD-10-CM | POA: Diagnosis not present

## 2024-03-10 DIAGNOSIS — I83009 Varicose veins of unspecified lower extremity with ulcer of unspecified site: Secondary | ICD-10-CM | POA: Diagnosis not present

## 2024-03-10 DIAGNOSIS — I10 Essential (primary) hypertension: Secondary | ICD-10-CM | POA: Diagnosis not present

## 2024-03-18 ENCOUNTER — Telehealth: Payer: Self-pay | Admitting: *Deleted

## 2024-03-18 NOTE — Telephone Encounter (Signed)
 LMOM (DPR) regarding Hannah Orr - appears pt never picked up from the office. If still interested in completing, will enroll through [employer] for direct shipment to her house. Also due for OV with Dr. Lavona as of 02/2024. Direct number provided for return call.

## 2024-03-26 ENCOUNTER — Other Ambulatory Visit (INDEPENDENT_AMBULATORY_CARE_PROVIDER_SITE_OTHER): Payer: Self-pay | Admitting: Nurse Practitioner

## 2024-04-01 ENCOUNTER — Encounter: Payer: Self-pay | Admitting: Gastroenterology

## 2024-04-10 ENCOUNTER — Ambulatory Visit: Admitting: Family Medicine

## 2024-05-05 ENCOUNTER — Ambulatory Visit: Admitting: Podiatry

## 2024-09-08 ENCOUNTER — Ambulatory Visit (INDEPENDENT_AMBULATORY_CARE_PROVIDER_SITE_OTHER): Admitting: Vascular Surgery

## 2025-02-09 ENCOUNTER — Ambulatory Visit
